# Patient Record
Sex: Male | Born: 1945 | Race: White | Hispanic: No | Marital: Married | Smoking: Never smoker
Health system: Southern US, Community
[De-identification: ages and names within clinical notes are randomized; demographics above are authoritative.]

## PROBLEM LIST (undated history)

## (undated) DIAGNOSIS — C719 Malignant neoplasm of brain, unspecified: Secondary | ICD-10-CM

## (undated) DIAGNOSIS — M1990 Unspecified osteoarthritis, unspecified site: Secondary | ICD-10-CM

## (undated) DIAGNOSIS — G500 Trigeminal neuralgia: Secondary | ICD-10-CM

## (undated) DIAGNOSIS — B019 Varicella without complication: Secondary | ICD-10-CM

## (undated) DIAGNOSIS — B069 Rubella without complication: Secondary | ICD-10-CM

## (undated) HISTORY — DX: Varicella without complication: B01.9

## (undated) HISTORY — DX: Unspecified osteoarthritis, unspecified site: M19.90

## (undated) HISTORY — PX: HERNIA REPAIR: SHX51

---

## 1997-10-02 ENCOUNTER — Emergency Department (HOSPITAL_COMMUNITY): Admission: EM | Admit: 1997-10-02 | Discharge: 1997-10-03 | Payer: Self-pay | Admitting: Emergency Medicine

## 2012-02-26 LAB — HM COLONOSCOPY

## 2015-04-06 DIAGNOSIS — Z85828 Personal history of other malignant neoplasm of skin: Secondary | ICD-10-CM | POA: Diagnosis not present

## 2015-04-06 DIAGNOSIS — C490 Malignant neoplasm of connective and soft tissue of head, face and neck: Secondary | ICD-10-CM | POA: Diagnosis not present

## 2015-06-25 DIAGNOSIS — Z6828 Body mass index (BMI) 28.0-28.9, adult: Secondary | ICD-10-CM | POA: Diagnosis not present

## 2015-06-25 DIAGNOSIS — M2012 Hallux valgus (acquired), left foot: Secondary | ICD-10-CM | POA: Diagnosis not present

## 2015-07-28 DIAGNOSIS — L570 Actinic keratosis: Secondary | ICD-10-CM | POA: Diagnosis not present

## 2015-07-28 DIAGNOSIS — L814 Other melanin hyperpigmentation: Secondary | ICD-10-CM | POA: Diagnosis not present

## 2016-01-05 DIAGNOSIS — Z23 Encounter for immunization: Secondary | ICD-10-CM | POA: Diagnosis not present

## 2016-05-05 DIAGNOSIS — L570 Actinic keratosis: Secondary | ICD-10-CM | POA: Diagnosis not present

## 2016-05-05 DIAGNOSIS — Z85828 Personal history of other malignant neoplasm of skin: Secondary | ICD-10-CM | POA: Diagnosis not present

## 2016-05-05 DIAGNOSIS — D1801 Hemangioma of skin and subcutaneous tissue: Secondary | ICD-10-CM | POA: Diagnosis not present

## 2016-05-05 DIAGNOSIS — L821 Other seborrheic keratosis: Secondary | ICD-10-CM | POA: Diagnosis not present

## 2016-11-03 DIAGNOSIS — L578 Other skin changes due to chronic exposure to nonionizing radiation: Secondary | ICD-10-CM | POA: Diagnosis not present

## 2016-11-03 DIAGNOSIS — D1801 Hemangioma of skin and subcutaneous tissue: Secondary | ICD-10-CM | POA: Diagnosis not present

## 2016-11-03 DIAGNOSIS — L570 Actinic keratosis: Secondary | ICD-10-CM | POA: Diagnosis not present

## 2016-11-03 DIAGNOSIS — L821 Other seborrheic keratosis: Secondary | ICD-10-CM | POA: Diagnosis not present

## 2016-12-19 DIAGNOSIS — Z23 Encounter for immunization: Secondary | ICD-10-CM | POA: Diagnosis not present

## 2016-12-30 ENCOUNTER — Encounter: Payer: Self-pay | Admitting: Family Medicine

## 2016-12-30 ENCOUNTER — Ambulatory Visit (INDEPENDENT_AMBULATORY_CARE_PROVIDER_SITE_OTHER): Payer: Medicare Other | Admitting: Family Medicine

## 2016-12-30 VITALS — BP 138/78 | HR 83 | Temp 98.2°F | Ht 68.0 in | Wt 179.1 lb

## 2016-12-30 DIAGNOSIS — M25512 Pain in left shoulder: Secondary | ICD-10-CM | POA: Diagnosis not present

## 2016-12-30 DIAGNOSIS — Z23 Encounter for immunization: Secondary | ICD-10-CM

## 2016-12-30 DIAGNOSIS — Z7689 Persons encountering health services in other specified circumstances: Secondary | ICD-10-CM

## 2016-12-30 MED ORDER — ZOSTER VAC RECOMB ADJUVANTED 50 MCG/0.5ML IM SUSR: 0.5000 mL | Freq: Once | INTRAMUSCULAR | 0 refills | Status: AC

## 2016-12-30 MED ORDER — ZOSTER VAC RECOMB ADJUVANTED 50 MCG/0.5ML IM SUSR: 0.5000 mL | Freq: Once | INTRAMUSCULAR | 0 refills | Status: DC

## 2016-12-30 NOTE — Progress Notes (Signed)
Subjective:    Patient ID: Hunter Chang, male    DOB: October 10, 1945, 71 y.o.   MRN: [id number]  HPI This is a 71 yo male who presents today to establish care. He is married. He is a retired Company secretary. He has three daughters and 6 grandchildren. He enjoys sports and plays tennis. He works part time at [employer] a.   Left shoulder pain following a fall while playing tennis several months ago. Caught self with left hand. Has been having intermittent tingling pain 1-2 times a day and pain radiating down shoulder. Tender at specific point. Not painful, does not interfere with sleep. No weakness. Has taken some tylenol.   Thinks last tetanus shot was 4-5 years ago. Had flu shot last week. Last CPE 3 years ago. Never abnormal cholesterol or "counts." Last colonoscopy 5 years ago- normal.   Past Medical History:  Diagnosis Date  . Arthritis   . Chicken pox    Past Surgical History:  Procedure Laterality Date  . HERNIA REPAIR     3 times   Family History  Problem Relation Age of Onset  . Arthritis Mother   . Diabetes Father   . Early death Father   . Heart disease Father   . Early death Maternal Grandfather   . COPD Paternal Grandfather    Social History  Substance Use Topics  . Smoking status: Never Smoker  . Smokeless tobacco: Never Used  . Alcohol use No      Review of Systems  Constitutional: Negative for fatigue, fever and unexpected weight change.  HENT: Negative for dental problem.   Respiratory: Negative for chest tightness and shortness of breath.   Cardiovascular: Negative for chest pain and leg swelling.  Gastrointestinal: Negative for abdominal pain, constipation and diarrhea.  Genitourinary: Negative for difficulty urinating.  Musculoskeletal:       Left shoulder pain.   Neurological: Negative for headaches.  Psychiatric/Behavioral: Negative for dysphoric mood and sleep disturbance. The patient is not nervous/anxious.        Objective:   Physical Exam Physical  Exam  Constitutional: Oriented to person, place, and time. He appears well-developed and well-nourished.  HENT:  Head: Normocephalic and atraumatic.  Eyes: Conjunctivae are normal.  Neck: Normal range of motion. Neck supple.  Cardiovascular: Normal rate, regular rhythm and normal heart sounds.   Pulmonary/Chest: Effort normal and breath sounds normal.  Musculoskeletal: Normal range of motion. Left shoulder with full ROM, strength 5/5, mild tenderness over posterior shoulder.  Neurological: Alert and oriented to person, place, and time.  Skin: Skin is warm and dry.  Psychiatric: Normal mood and affect. Behavior is normal. Judgment and thought content normal.  Vitals reviewed.     BP (!) 156/80   Pulse 83   Temp 98.2 F (36.8 C) (Oral)   Ht 5\' 8"  (1.727 m)   Wt 179 lb 1.9 oz (81.2 kg)   SpO2 95%   BMI 27.24 kg/m  Recheck BP 138/78 Depression screen PHQ 2/9 12/30/2016  Decreased Interest 0  Down, Depressed, Hopeless 0  PHQ - 2 Score 0       Assessment & Plan:  1. Encounter to establish care - will request records from prior PCP - Discussed and encouraged healthy lifestyle choices- adequate sleep, regular exercise, stress management and healthy food choices.   2. Acute pain of left shoulder - exam reassuring with full ROM and strength, will try conservative measures with NSAIDs and exercises- instructions provided - follow up with Dr.  Copland if no improvement in 1-2 weeks for consideration of injection  3. Need for prophylactic vaccination against Streptococcus pneumoniae (pneumococcus) - Pneumococcal conjugate vaccine 13-valent IM  4. Need for shingles vaccine - Zoster Vac Recomb Adjuvanted [facility]) injection; Inject 0.5 mLs into the muscle once.  Dispense: 0.5 mL; Refill: 0 - Zoster Vac Recomb Adjuvanted (SHINGRIX) injection; Inject 0.5 mLs into the muscle once. Second dose to be given 6-12 months after first.  Dispense: 0.5 mL; Refill: 0  - follow up in 1  year  Clarene Reamer, FNP-BC  [facility], [place]  12/30/2016 5:23 PM

## 2016-12-30 NOTE — Patient Instructions (Signed)
It was a pleasure to meet you today! I look forward to partnering with you for your health care needs  Please take ibuprofen 400 mg twice a day for 5-7 days for inflammation of shoulder Please try the following exercises- do not do something if it hurts If not better in 1-2 weeks, please schedule an appointment with Dr. Lorelei Pont who is our sports medicine provider  Please return in 1 year for annual visit, sooner if you have any concerns     Shoulder Exercises Ask your health care provider which exercises are safe for you. Do exercises exactly as told by your health care provider and adjust them as directed. It is normal to feel mild stretching, pulling, tightness, or discomfort as you do these exercises, but you should stop right away if you feel sudden pain or your pain gets worse.Do not begin these exercises until told by your health care provider. RANGE OF MOTION EXERCISES These exercises warm up your muscles and joints and improve the movement and flexibility of your shoulder. These exercises also help to relieve pain, numbness, and tingling. These exercises involve stretching your injured shoulder directly. Exercise A: Pendulum  1. Stand near a wall or a surface that you can hold onto for balance. 2. Bend at the waist and let your left / right arm hang straight down. Use your other arm to support you. Keep your back straight and do not lock your knees. 3. Relax your left / right arm and shoulder muscles, and move your hips and your trunk so your left / right arm swings freely. Your arm should swing because of the motion of your body, not because you are using your arm or shoulder muscles. 4. Keep moving your body so your arm swings in the following directions, as told by your health care provider: ? Side to side. ? Forward and backward. ? In clockwise and counterclockwise circles. 5. Continue each motion for ______10-15____ seconds, or for as long as told by your health care  provider. 6. Slowly return to the starting position. Repeat _____2-3_____ times. Complete this exercise ___1-2_______ times a day. Exercise B:Flexion, Standing  1. Stand and hold a broomstick, a cane, or a similar object. Place your hands a little more than shoulder-width apart on the object. Your left / right hand should be palm-up, and your other hand should be palm-down. 2. Keep your elbow straight and keep your shoulder muscles relaxed. Push the stick down with your healthy arm to raise your left / right arm in front of your body, and then over your head until you feel a stretch in your shoulder. ? Avoid shrugging your shoulder while you raise your arm. Keep your shoulder blade tucked down toward the middle of your back. 3. Hold for ___5-10_______ seconds. 4. Slowly return to the starting position. Repeat _______3-4___ times. Complete this exercise ______1-2____ times a day. Exercise C: Abduction, Standing 1. Stand and hold a broomstick, a cane, or a similar object. Place your hands a little more than shoulder-width apart on the object. Your left / right hand should be palm-up, and your other hand should be palm-down. 2. While keeping your elbow straight and your shoulder muscles relaxed, push the stick across your body toward your left / right side. Raise your left / right arm to the side of your body and then over your head until you feel a stretch in your shoulder. ? Do not raise your arm above shoulder height, unless your health care provider tells  you to do that. ? Avoid shrugging your shoulder while you raise your arm. Keep your shoulder blade tucked down toward the middle of your back. 3. Hold for _______5-10___ seconds. 4. Slowly return to the starting position. Repeat _______3-4___ times. Complete this exercise ___1-2_______ times a day. Exercise D:Internal Rotation  1. Place your left / right hand behind your back, palm-up. 2. Use your other hand to dangle an exercise band, a  towel, or a similar object over your shoulder. Grasp the band with your left / right hand so you are holding onto both ends. 3. Gently pull up on the band until you feel a stretch in the front of your left / right shoulder. ? Avoid shrugging your shoulder while you raise your arm. Keep your shoulder blade tucked down toward the middle of your back. 4. Hold for _____5-10_____ seconds. 5. Release the stretch by letting go of the band and lowering your hands. Repeat ________3-4__ times. Complete this exercise ________1-2__ times a day. STRETCHING EXERCISES These exercises warm up your muscles and joints and improve the movement and flexibility of your shoulder. These exercises also help to relieve pain, numbness, and tingling. These exercises are done using your healthy shoulder to help stretch the muscles of your injured shoulder. Exercise E: Warehouse manager (External Rotation and Abduction)  1. Stand in a doorway with one of your feet slightly in front of the other. This is called a staggered stance. If you cannot reach your forearms to the door frame, stand facing a corner of a room. 2. Choose one of the following positions as told by your health care provider: ? Place your hands and forearms on the door frame above your head. ? Place your hands and forearms on the door frame at the height of your head. ? Place your hands on the door frame at the height of your elbows. 3. Slowly move your weight onto your front foot until you feel a stretch across your chest and in the front of your shoulders. Keep your head and chest upright and keep your abdominal muscles tight. 4. Hold for ___5-10_______ seconds. 5. To release the stretch, shift your weight to your back foot. Repeat _____3-4_____ times. Complete this stretch ____1-2______ times a day. Exercise F:Extension, Standing 1. Stand and hold a broomstick, a cane, or a similar object behind your back. ? Your hands should be a little wider than  shoulder-width apart. ? Your palms should face away from your back. 2. Keeping your elbows straight and keeping your shoulder muscles relaxed, move the stick away from your body until you feel a stretch in your shoulder. ? Avoid shrugging your shoulders while you move the stick. Keep your shoulder blade tucked down toward the middle of your back. 3. Hold for __________ seconds. 4. Slowly return to the starting position. Repeat __________ times. Complete this exercise __________ times a day. STRENGTHENING EXERCISES These exercises build strength and endurance in your shoulder. Endurance is the ability to use your muscles for a long time, even after they get tired. Exercise G:External Rotation  1. Sit in a stable chair without armrests. 2. Secure an exercise band at elbow height on your left / right side. 3. Place a soft object, such as a folded towel or a small pillow, between your left / right upper arm and your body to move your elbow a few inches away (about 10 cm) from your side. 4. Hold the end of the band so it is tight and there is  no slack. 5. Keeping your elbow pressed against the soft object, move your left / right forearm out, away from your abdomen. Keep your body steady so only your forearm moves. 6. Hold for __________ seconds. 7. Slowly return to the starting position. Repeat __________ times. Complete this exercise __________ times a day. Exercise H:Shoulder Abduction  1. Sit in a stable chair without armrests, or stand. 2. Hold a __________ weight in your left / right hand, or hold an exercise band with both hands. 3. Start with your arms straight down and your left / right palm facing in, toward your body. 4. Slowly lift your left / right hand out to your side. Do not lift your hand above shoulder height unless your health care provider tells you that this is safe. ? Keep your arms straight. ? Avoid shrugging your shoulder while you do this movement. Keep your shoulder  blade tucked down toward the middle of your back. 5. Hold for __________ seconds. 6. Slowly lower your arm, and return to the starting position. Repeat __________ times. Complete this exercise __________ times a day. Exercise I:Shoulder Extension 1. Sit in a stable chair without armrests, or stand. 2. Secure an exercise band to a stable object in front of you where it is at shoulder height. 3. Hold one end of the exercise band in each hand. Your palms should face each other. 4. Straighten your elbows and lift your hands up to shoulder height. 5. Step back, away from the secured end of the exercise band, until the band is tight and there is no slack. 6. Squeeze your shoulder blades together as you pull your hands down to the sides of your thighs. Stop when your hands are straight down by your sides. Do not let your hands go behind your body. 7. Hold for __________ seconds. 8. Slowly return to the starting position. Repeat __________ times. Complete this exercise __________ times a day. Exercise J:Standing Shoulder Row 1. Sit in a stable chair without armrests, or stand. 2. Secure an exercise band to a stable object in front of you so it is at waist height. 3. Hold one end of the exercise band in each hand. Your palms should be in a thumbs-up position. 4. Bend each of your elbows to an "L" shape (about 90 degrees) and keep your upper arms at your sides. 5. Step back until the band is tight and there is no slack. 6. Slowly pull your elbows back behind you. 7. Hold for __________ seconds. 8. Slowly return to the starting position. Repeat __________ times. Complete this exercise __________ times a day. Exercise K:Shoulder Press-Ups  1. Sit in a stable chair that has armrests. Sit upright, with your feet flat on the floor. 2. Put your hands on the armrests so your elbows are bent and your fingers are pointing forward. Your hands should be about even with the sides of your body. 3. Push down  on the armrests and use your arms to lift yourself off of the chair. Straighten your elbows and lift yourself up as much as you comfortably can. ? Move your shoulder blades down, and avoid letting your shoulders move up toward your ears. ? Keep your feet on the ground. As you get stronger, your feet should support less of your body weight as you lift yourself up. 4. Hold for __________ seconds. 5. Slowly lower yourself back into the chair. Repeat __________ times. Complete this exercise __________ times a day. Exercise L: Wall Push-Ups  1.  Stand so you are facing a stable wall. Your feet should be about one arm-length away from the wall. 2. Lean forward and place your palms on the wall at shoulder height. 3. Keep your feet flat on the floor as you bend your elbows and lean forward toward the wall. 4. Hold for __________ seconds. 5. Straighten your elbows to push yourself back to the starting position. Repeat __________ times. Complete this exercise __________ times a day. This information is not intended to replace advice given to you by your health care provider. Make sure you discuss any questions you have with your health care provider. Document Released: 01/26/2005 Document Revised: 12/07/2015 Document Reviewed: 11/23/2014 Elsevier Interactive Patient Education  2018 [employer].

## 2016-12-30 NOTE — Progress Notes (Signed)
Pre visit review using our clinic review tool, if applicable. No additional management support is needed unless otherwise documented below in the visit note. 

## 2017-01-02 ENCOUNTER — Telehealth: Payer: Self-pay | Admitting: Family Medicine

## 2017-01-02 NOTE — Telephone Encounter (Signed)
Left pt message asking to call Allison back directly at [phone] to schedule AWV + labs with Lesia and CPE with PCP. ° °*NOTE* No hx of AWV °

## 2017-02-21 NOTE — Telephone Encounter (Signed)
Scheduled 03/03/17

## 2017-02-28 ENCOUNTER — Encounter: Payer: Self-pay | Admitting: Family Medicine

## 2017-03-02 ENCOUNTER — Telehealth: Payer: Self-pay | Admitting: Family Medicine

## 2017-03-02 NOTE — Progress Notes (Signed)
Subjective:   Hunter Chang is a 71 y.o. male who presents for an Initial Medicare Annual Wellness Visit.  Review of Systems  No ROS.  Medicare Wellness Visit. Additional risk factors are reflected in the social history.  Cardiac Risk Factors include: advanced age (>66men, >53 women);family history of premature cardiovascular disease;male gender Sleep patterns: Sleeps 6 hours, feels rested.  Home Safety/Smoke Alarms: Feels safe in home. Smoke alarms in place.  Living environment; residence and Firearm Safety: Lives with wife in 1 story home.  Seat Belt Safety/Bike Helmet: Wears seat belt.    Male:   CCS-Colonoscopy 03/29/2011, pt reports normal.      PSA- No results found for: PSA     Objective:    Today's Vitals   03/03/17 1341  BP: 130/76  Pulse: (!) 58  SpO2: 99%  Weight: 179 lb 1.9 oz (81.2 kg)  Height: 5\' 8"  (1.727 m)   Body mass index is 27.24 kg/m.  Advanced Directives 03/03/2017  Does Patient Have a Medical Advance Directive? No  Would patient like information on creating a medical advance directive? No - Patient declined    Current Medications (verified) Outpatient Encounter Medications as of 03/03/2017  Medication Sig  . aspirin 81 MG EC tablet Take 81 mg by mouth daily. Swallow whole.   No facility-administered encounter medications on file as of 03/03/2017.     Allergies (verified) Patient has no allergy information on record.   History: Past Medical History:  Diagnosis Date  . Arthritis   . Chicken pox    Past Surgical History:  Procedure Laterality Date  . HERNIA REPAIR     3 times   Family History  Problem Relation Age of Onset  . Arthritis Mother   . Diabetes Father   . Early death Father   . Heart disease Father   . Early death Maternal Grandfather   . COPD Paternal Grandfather    Social History   Socioeconomic History  . Marital status: Married    Spouse name: None  . Number of children: None  . Years of education: None  .  Highest education level: None  Social Needs  . Financial resource strain: None  . Food insecurity - worry: None  . Food insecurity - inability: None  . Transportation needs - medical: None  . Transportation needs - non-medical: None  Occupational History  . None  Tobacco Use  . Smoking status: Never Smoker  . Smokeless tobacco: Never Used  Substance and Sexual Activity  . Alcohol use: No  . Drug use: No  . Sexual activity: Yes  Other Topics Concern  . None  Social History Narrative  . None   Tobacco Counseling Counseling given: Not Answered    Activities of Daily Living In your present state of health, do you have any difficulty performing the following activities: 03/03/2017  Hearing? N  Vision? N  Difficulty concentrating or making decisions? N  Walking or climbing stairs? N  Dressing or bathing? N  Doing errands, shopping? N  Preparing Food and eating ? N  Using the Toilet? N  In the past six months, have you accidently leaked urine? N  Do you have problems with loss of bowel control? N  Managing your Medications? N  Managing your Finances? N  Housekeeping or managing your Housekeeping? N  Some recent data might be hidden      Immunizations and Health Maintenance Immunization History  Administered Date(s) Administered  . Pneumococcal Conjugate-13 12/30/2016  .  Tdap 06/05/2012   Health Maintenance Due  Topic Date Due  . INFLUENZA VACCINE  10/26/2016    Patient Care Team: Elby Beck, FNP as PCP - General (Nurse Practitioner) Dermatology, [facility] any recent Medical Services you may have received from other than [facility] providers in the past year (date may be approximate).    Assessment:   This is a routine wellness examination for Hunter Chang. Physical assessment deferred to PCP.   Hearing/Vision screen  Visual Acuity Screening   Right eye Left eye Both eyes  Without correction: 20/30 20/30 20/30   With correction:     Comments: Last  exam > 3 years. Wears reading glasses.   Hearing Screening Comments: Able to hear conversational tones w/o difficulty. No issues reported.    Dietary issues and exercise activities discussed: Current Exercise Habits: Structured exercise class, Type of exercise: walking(golf, tennis), Time (Minutes): 30, Frequency (Times/Week): 4, Weekly Exercise (Minutes/Week): 120, Exercise limited by: None identified Diet (meal preparation, eat out, water intake, caffeinated beverages, dairy products, fruits and vegetables): Drinks water, coffee and sweet tea.   Eats heart healthy diet.     Goals    . Patient Stated     Maintain current health by staying active.       Depression Screen PHQ 2/9 Scores 03/03/2017 12/30/2016  PHQ - 2 Score 0 0    Fall Risk Fall Risk  03/03/2017 12/30/2016  Falls in the past year? No Yes  Number falls in past yr: - 1  Injury with Fall? - Yes     Cognitive Function: MMSE - Mini Mental State Exam 03/03/2017  Orientation to time 5  Orientation to Place 5  Registration 3  Attention/ Calculation 5  Recall 3  Language- name 2 objects 2  Language- repeat 1  Language- follow 3 step command 3  Language- read & follow direction 1  Write a sentence 1  Copy design 1  Total score 30        Screening Tests Health Maintenance  Topic Date Due  . INFLUENZA VACCINE  10/26/2016  . Hepatitis C Screening  03/03/2018 (Originally October 14, 1945)  . PNA vac Low Risk Adult (2 of 2 - PPSV23) 12/30/2017  . COLONOSCOPY  02/25/2022  . TETANUS/TDAP  06/06/2022        Plan:     Make eye appointment.   Bring a copy of your living will and/or healthcare power of attorney to your next office visit.  Continue doing brain stimulating activities (puzzles, reading, adult coloring books, staying active) to keep memory sharp.   I have personally reviewed and noted the following in the patient's chart:   . Medical and social history . Use of alcohol, tobacco or illicit drugs   . Current medications and supplements . Functional ability and status . Nutritional status . Physical activity . Advanced directives . List of other physicians . Hospitalizations, surgeries, and ER visits in previous 12 months . Vitals . Screenings to include cognitive, depression, and falls . Referrals and appointments  In addition, I have reviewed and discussed with patient certain preventive protocols, quality metrics, and best practice recommendations. A written personalized care plan for preventive services as well as general preventive health recommendations were provided to patient.     Gerilyn Nestle, RN   03/03/2017

## 2017-03-02 NOTE — Telephone Encounter (Signed)
Please enter AWV lab orders due to Lesia out of office °

## 2017-03-03 ENCOUNTER — Ambulatory Visit (INDEPENDENT_AMBULATORY_CARE_PROVIDER_SITE_OTHER): Payer: Medicare Other

## 2017-03-03 ENCOUNTER — Other Ambulatory Visit: Payer: Self-pay

## 2017-03-03 ENCOUNTER — Other Ambulatory Visit: Payer: Self-pay | Admitting: Family Medicine

## 2017-03-03 ENCOUNTER — Other Ambulatory Visit: Payer: Medicare Other

## 2017-03-03 VITALS — BP 130/76 | HR 58 | Ht 68.0 in | Wt 179.1 lb

## 2017-03-03 DIAGNOSIS — E663 Overweight: Secondary | ICD-10-CM

## 2017-03-03 DIAGNOSIS — Z13220 Encounter for screening for lipoid disorders: Secondary | ICD-10-CM | POA: Diagnosis not present

## 2017-03-03 DIAGNOSIS — Z0000 Encounter for general adult medical examination without abnormal findings: Secondary | ICD-10-CM

## 2017-03-03 LAB — COMPREHENSIVE METABOLIC PANEL
ALBUMIN: 4.2 g/dL (ref 3.5–5.2)
ALK PHOS: 86 U/L (ref 39–117)
ALT: 20 U/L (ref 0–53)
AST: 31 U/L (ref 0–37)
BUN: 14 mg/dL (ref 6–23)
CALCIUM: 9.2 mg/dL (ref 8.4–10.5)
CO2: 29 mEq/L (ref 19–32)
CREATININE: 1 mg/dL (ref 0.40–1.50)
Chloride: 103 mEq/L (ref 96–112)
GFR: 78.36 mL/min (ref >60.00)
Glucose, Bld: 90 mg/dL (ref 70–99)
POTASSIUM: 4.4 meq/L (ref 3.5–5.1)
SODIUM: 139 meq/L (ref 135–145)
TOTAL PROTEIN: 6.9 g/dL (ref 6.0–8.3)
Total Bilirubin: 0.9 mg/dL (ref 0.2–1.2)

## 2017-03-03 LAB — LIPID PANEL
Cholesterol: 165 mg/dL (ref 0–200)
HDL: 48.2 mg/dL (ref >39.00)
LDL CALC: 97 mg/dL (ref 0–99)
NONHDL: 116.31
Total CHOL/HDL Ratio: 3
Triglycerides: 95 mg/dL (ref 0.0–149.0)
VLDL: 19 mg/dL (ref 0.0–40.0)

## 2017-03-03 LAB — CBC
HCT: 44.9 % (ref 39.0–52.0)
HEMOGLOBIN: 15.4 g/dL (ref 13.0–17.0)
MCHC: 34.2 g/dL (ref 30.0–36.0)
MCV: 91.4 fl (ref 78.0–100.0)
Platelets: 212 10*3/uL (ref 150.0–400.0)
RBC: 4.92 Mil/uL (ref 4.22–5.81)
RDW: 13.6 % (ref 11.5–15.5)
WBC: 5.6 10*3/uL (ref 4.0–10.5)

## 2017-03-03 NOTE — Addendum Note (Signed)
Addended by: Mady Haagensen on: 03/03/2017 02:07 PM   Modules accepted: Orders

## 2017-03-03 NOTE — Patient Instructions (Addendum)
Hunter Chang , Thank you for taking time to come for your Medicare Wellness Visit. I appreciate your ongoing commitment to your health goals. Please review the following plan we discussed and let me know if I can assist you in the future.   These are the goals we discussed: Goals    . Patient Stated     Maintain current health by staying active.        This is a list of the screening recommended for you and due dates:  Health Maintenance  Topic Date Due  . Flu Shot  10/26/2016  .  Hepatitis C: One time screening is recommended by Center for Disease Control  (CDC) for  adults born from [birth year] through 1965.   03/03/2018*  . Pneumonia vaccines (2 of 2 - PPSV23) 12/30/2017  . Colon Cancer Screening  02/25/2022  . Tetanus Vaccine  06/06/2022  *Topic was postponed. The date shown is not the original due date.    Make eye appointment.   Bring a copy of your living will and/or healthcare power of attorney to your next office visit.  Continue doing brain stimulating activities (puzzles, reading, adult coloring books, staying active) to keep memory sharp.    Fall Prevention in the Home Falls can cause injuries. They can happen to people of all ages. There are many things you can do to make your home safe and to help prevent falls. What can I do on the outside of my home?  Regularly fix the edges of walkways and driveways and fix any cracks.  Remove anything that might make you trip as you walk through a door, such as a raised step or threshold.  Trim any bushes or trees on the path to your home.  Use bright outdoor lighting.  Clear any walking paths of anything that might make someone trip, such as rocks or tools.  Regularly check to see if handrails are loose or broken. Make sure that both sides of any steps have handrails.  Any raised decks and porches should have guardrails on the edges.  Have any leaves, snow, or ice cleared regularly.  Use sand or salt on walking paths during  winter.  Clean up any spills in your garage right away. This includes oil or grease spills. What can I do in the bathroom?  Use night lights.  Install grab bars by the toilet and in the tub and shower. Do not use towel bars as grab bars.  Use non-skid mats or decals in the tub or shower.  If you need to sit down in the shower, use a plastic, non-slip stool.  Keep the floor dry. Clean up any water that spills on the floor as soon as it happens.  Remove soap buildup in the tub or shower regularly.  Attach bath mats securely with double-sided non-slip rug tape.  Do not have throw rugs and other things on the floor that can make you trip. What can I do in the bedroom?  Use night lights.  Make sure that you have a light by your bed that is easy to reach.  Do not use any sheets or blankets that are too big for your bed. They should not hang down onto the floor.  Have a firm chair that has side arms. You can use this for support while you get dressed.  Do not have throw rugs and other things on the floor that can make you trip. What can I do in the kitchen?  Clean  up any spills right away.  Avoid walking on wet floors.  Keep items that you use a lot in easy-to-reach places.  If you need to reach something above you, use a strong step stool that has a grab bar.  Keep electrical cords out of the way.  Do not use floor polish or wax that makes floors slippery. If you must use wax, use non-skid floor wax.  Do not have throw rugs and other things on the floor that can make you trip. What can I do with my stairs?  Do not leave any items on the stairs.  Make sure that there are handrails on both sides of the stairs and use them. Fix handrails that are broken or loose. Make sure that handrails are as long as the stairways.  Check any carpeting to make sure that it is firmly attached to the stairs. Fix any carpet that is loose or worn.  Avoid having throw rugs at the top or  bottom of the stairs. If you do have throw rugs, attach them to the floor with carpet tape.  Make sure that you have a light switch at the top of the stairs and the bottom of the stairs. If you do not have them, ask someone to add them for you. What else can I do to help prevent falls?  Wear shoes that: ? Do not have high heels. ? Have rubber bottoms. ? Are comfortable and fit you well. ? Are closed at the toe. Do not wear sandals.  If you use a stepladder: ? Make sure that it is fully opened. Do not climb a closed stepladder. ? Make sure that both sides of the stepladder are locked into place. ? Ask someone to hold it for you, if possible.  Clearly mark and make sure that you can see: ? Any grab bars or handrails. ? First and last steps. ? Where the edge of each step is.  Use tools that help you move around (mobility aids) if they are needed. These include: ? Canes. ? Walkers. ? Scooters. ? Crutches.  Turn on the lights when you go into a dark area. Replace any light bulbs as soon as they burn out.  Set up your furniture so you have a clear path. Avoid moving your furniture around.  If any of your floors are uneven, fix them.  If there are any pets around you, be aware of where they are.  Review your medicines with your doctor. Some medicines can make you feel dizzy. This can increase your chance of falling. Ask your doctor what other things that you can do to help prevent falls. This information is not intended to replace advice given to you by your health care provider. Make sure you discuss any questions you have with your health care provider. Document Released: 01/08/2009 Document Revised: 08/20/2015 Document Reviewed: 04/18/2014 Elsevier Interactive Patient Education  2018 [place] Maintenance, Male A healthy lifestyle and preventive care is important for your health and wellness. Ask your health care provider about what schedule of regular  examinations is right for you. What should I know about weight and diet? Eat a Healthy Diet  Eat plenty of vegetables, fruits, whole grains, low-fat dairy products, and lean protein.  Do not eat a lot of foods high in solid fats, added sugars, or salt.  Maintain a Healthy Weight Regular exercise can help you achieve or maintain a healthy weight. You should:  Do at least 150 minutes  of exercise each week. The exercise should increase your heart rate and make you sweat (moderate-intensity exercise).  Do strength-training exercises at least twice a week.  Watch Your Levels of Cholesterol and Blood Lipids  Have your blood tested for lipids and cholesterol every 5 years starting at 70 years of age. If you are at high risk for heart disease, you should start having your blood tested when you are 70 years old. You may need to have your cholesterol levels checked more often if: ? Your lipid or cholesterol levels are high. ? You are older than 70 years of age. ? You are at high risk for heart disease.  What should I know about cancer screening? Many types of cancers can be detected early and may often be prevented. Lung Cancer  You should be screened every year for lung cancer if: ? You are a current smoker who has smoked for at least 30 years. ? You are a former smoker who has quit within the past 15 years.  Talk to your health care provider about your screening options, when you should start screening, and how often you should be screened.  Colorectal Cancer  Routine colorectal cancer screening usually begins at 70 years of age and should be repeated every 5-10 years until you are 70 years old. You may need to be screened more often if early forms of precancerous polyps or small growths are found. Your health care provider may recommend screening at an earlier age if you have risk factors for colon cancer.  Your health care provider may recommend using home test kits to check for hidden  blood in the stool.  A small camera at the end of a tube can be used to examine your colon (sigmoidoscopy or colonoscopy). This checks for the earliest forms of colorectal cancer.  Prostate and Testicular Cancer  Depending on your age and overall health, your health care provider may do certain tests to screen for prostate and testicular cancer.  Talk to your health care provider about any symptoms or concerns you have about testicular or prostate cancer.  Skin Cancer  Check your skin from head to toe regularly.  Tell your health care provider about any new moles or changes in moles, especially if: ? There is a change in a mole's size, shape, or color. ? You have a mole that is larger than a pencil eraser.  Always use sunscreen. Apply sunscreen liberally and repeat throughout the day.  Protect yourself by wearing long sleeves, pants, a wide-brimmed hat, and sunglasses when outside.  What should I know about heart disease, diabetes, and high blood pressure?  If you are 76-77 years of age, have your blood pressure checked every 3-5 years. If you are 34 years of age or older, have your blood pressure checked every year. You should have your blood pressure measured twice-once when you are at a hospital or clinic, and once when you are not at a hospital or clinic. Record the average of the two measurements. To check your blood pressure when you are not at a hospital or clinic, you can use: ? An automated blood pressure machine at a pharmacy. ? A home blood pressure monitor.  Talk to your health care provider about your target blood pressure.  If you are between 61-95 years old, ask your health care provider if you should take aspirin to prevent heart disease.  Have regular diabetes screenings by checking your fasting blood sugar level. ? If you are  at a normal weight and have a low risk for diabetes, have this test once every three years after the age of 61. ? If you are overweight and  have a high risk for diabetes, consider being tested at a younger age or more often.  A one-time screening for abdominal aortic aneurysm (AAA) by ultrasound is recommended for men aged 81-75 years who are current or former smokers. What should I know about preventing infection? Hepatitis B If you have a higher risk for hepatitis B, you should be screened for this virus. Talk with your health care provider to find out if you are at risk for hepatitis B infection. Hepatitis C Blood testing is recommended for:  Everyone born from [birth year] through 1965.  Anyone with known risk factors for hepatitis C.  Sexually Transmitted Diseases (STDs)  You should be screened each year for STDs including gonorrhea and chlamydia if: ? You are sexually active and are younger than 70 years of age. ? You are older than 70 years of age and your health care provider tells you that you are at risk for this type of infection. ? Your sexual activity has changed since you were last screened and you are at an increased risk for chlamydia or gonorrhea. Ask your health care provider if you are at risk.  Talk with your health care provider about whether you are at high risk of being infected with HIV. Your health care provider may recommend a prescription medicine to help prevent HIV infection.  What else can I do?  Schedule regular health, dental, and eye exams.  Stay current with your vaccines (immunizations).  Do not use any tobacco products, such as cigarettes, chewing tobacco, and e-cigarettes. If you need help quitting, ask your health care provider.  Limit alcohol intake to no more than 2 drinks per day. One drink equals 12 ounces of beer, 5 ounces of wine, or 1 ounces of hard liquor.  Do not use street drugs.  Do not share needles.  Ask your health care provider for help if you need support or information about quitting drugs.  Tell your health care provider if you often feel depressed.  Tell your  health care provider if you have ever been abused or do not feel safe at home. This information is not intended to replace advice given to you by your health care provider. Make sure you discuss any questions you have with your health care provider. Document Released: 09/10/2007 Document Revised: 11/11/2015 Document Reviewed: 12/16/2014 Elsevier Interactive Patient Education  [employer].

## 2017-03-03 NOTE — Telephone Encounter (Signed)
They have been entered.

## 2017-03-03 NOTE — Progress Notes (Signed)
PCP notes:   Health maintenance:  UTD  Abnormal screenings:   Vision 20/30 right , left and both. Plans to make appt.  Patient concerns:   None  Nurse concerns:  None  Next PCP appt:   03/10/17

## 2017-03-08 NOTE — Progress Notes (Signed)
I reviewed health advisor's note, was available for consultation, and agree with documentation and plan.  

## 2017-03-10 ENCOUNTER — Ambulatory Visit: Payer: Medicare Other | Admitting: Family Medicine

## 2017-04-07 ENCOUNTER — Ambulatory Visit (INDEPENDENT_AMBULATORY_CARE_PROVIDER_SITE_OTHER): Payer: Medicare Other | Admitting: Family Medicine

## 2017-04-07 ENCOUNTER — Encounter: Payer: Self-pay | Admitting: Family Medicine

## 2017-04-07 VITALS — BP 138/82 | HR 68 | Temp 98.0°F | Wt 182.0 lb

## 2017-04-07 DIAGNOSIS — Z0000 Encounter for general adult medical examination without abnormal findings: Secondary | ICD-10-CM | POA: Diagnosis not present

## 2017-04-07 DIAGNOSIS — M25512 Pain in left shoulder: Secondary | ICD-10-CM

## 2017-04-07 DIAGNOSIS — E663 Overweight: Secondary | ICD-10-CM

## 2017-04-07 NOTE — Patient Instructions (Signed)
GReat to see you today!  I will see you in 1 year unless you need me sooner.  Keeping you healthy  Get these tests  Blood pressure- Have your blood pressure checked once a year by your healthcare provider.  Normal blood pressure is 120/80  Weight- Have your body mass index (BMI) calculated to screen for obesity.  BMI is a measure of body fat based on height and weight. You can also calculate your own BMI at ViewBanking.si.  Cholesterol- Have your cholesterol checked every year.  Diabetes- Have your blood sugar checked regularly if you have high blood pressure, high cholesterol, have a family history of diabetes or if you are overweight.  Screening for Colon Cancer- Colonoscopy starting at age 14.  Screening may begin sooner depending on your family history and other health conditions. Follow up colonoscopy as directed by your Gastroenterologist.  Screening for Prostate Cancer- Both blood work (PSA) and a rectal exam help screen for Prostate Cancer.  Screening begins at age 53 with African-American men and at age 62 with Caucasian men.  Screening may begin sooner depending on your family history.  Take these medicines  Aspirin- One aspirin daily can help prevent Heart disease and Stroke.  Flu shot- Every fall.  Tetanus- Every 10 years.  Zostavax- Once after the age of 71 to prevent Shingles.  Pneumonia shot- Once after the age of 71; if you are younger than 71, ask your healthcare provider if you need a Pneumonia shot.  Take these steps  Don't smoke- If you do smoke, talk to your doctor about quitting.  For tips on how to quit, go to www.smokefree.gov or call 1-800-QUIT-NOW.  Be physically active- Exercise 5 days a week for at least 30 minutes.  If you are not already physically active start slow and gradually work up to 30 minutes of moderate physical activity.  Examples of moderate activity include walking briskly, mowing the yard, dancing, swimming, bicycling,  etc.  Eat a healthy diet- Eat a variety of healthy food such as fruits, vegetables, low fat milk, low fat cheese, yogurt, lean meant, poultry, fish, beans, tofu, etc. For more information go to www.thenutritionsource.org  Drink alcohol in moderation- Limit alcohol intake to less than two drinks a day. Never drink and drive.  Dentist- Brush and floss twice daily; visit your dentist twice a year.  Depression- Your emotional health is as important as your physical health. If you're feeling down, or losing interest in things you would normally enjoy please talk to your healthcare provider.  Eye exam- Visit your eye doctor every year.  Safe sex- If you may be exposed to a sexually transmitted infection, use a condom.  Seat belts- Seat belts can save your life; always wear one.  Smoke/Carbon Monoxide detectors- These detectors need to be installed on the appropriate level of your home.  Replace batteries at least once a year.  Skin cancer- When out in the sun, cover up and use sunscreen 15 SPF or higher.  Violence- If anyone is threatening you, please tell your healthcare provider.  Living Will/ Health care power of attorney- Speak with your healthcare provider and family.

## 2017-04-07 NOTE — Progress Notes (Signed)
Subjective:    Patient ID: Hunter Chang, male    DOB: 21-Aug-1945, 72 y.o.   MRN: [id number]  HPI This is a 72 yo male who presents today for CPE. Had Medicare AWV last month with labs. He has been doing well with recent travel to [place] with his children and grandchildren. 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 He has no complaints or concerns today.   Last CPE- 1 year Colonoscopy- 2013 Tdap- 06/05/12 Flu- annual Dental- semi-annual Eye-annual Exercise- regular  Past Medical History:  Diagnosis Date  . Arthritis   . Chicken pox    Past Surgical History:  Procedure Laterality Date  . HERNIA REPAIR     3 times   Family History  Problem Relation Age of Onset  . Arthritis Mother   . Diabetes Father   . Early death Father   . Heart disease Father   . Early death Maternal Grandfather   . COPD Paternal Grandfather    Social History   Tobacco Use  . Smoking status: Never Smoker  . Smokeless tobacco: Never Used  Substance Use Topics  . Alcohol use: No  . Drug use: No     Review of Systems  Constitutional: Negative for fever.  HENT: Negative.   Eyes: Negative.   Respiratory: Negative for cough and shortness of breath.   Cardiovascular: Negative for chest pain and leg swelling.  Gastrointestinal: Negative.   Genitourinary: Negative.   Musculoskeletal:       Shoulder pain improved, no other MS complaints.   Skin: Negative.        Sees derm, wears sunscreen daily.   Neurological: Negative.   Hematological: Negative.   Psychiatric/Behavioral: Negative.        Objective:   Physical Exam  Constitutional: He is oriented to person, place, and time. He appears well-developed and well-nourished. No distress.  HENT:  Head: Normocephalic and atraumatic.  Right Ear: External ear normal.  Left Ear: External ear normal.  Mouth/Throat: Oropharynx is clear and moist. No oropharyngeal exudate.  Eyes: Conjunctivae and EOM are normal. Pupils are equal, round, and reactive  to light. Right eye exhibits no discharge. Left eye exhibits no discharge.  Neck: Normal range of motion. Neck supple. No thyromegaly present.  Cardiovascular: Normal rate, regular rhythm, normal heart sounds and intact distal pulses.  Pulmonary/Chest: Effort normal and breath sounds normal.  Abdominal: Soft. Bowel sounds are normal. He exhibits no distension. There is no tenderness. There is no rebound.  Musculoskeletal: Normal range of motion. He exhibits no edema.  Lymphadenopathy:    He has no cervical adenopathy.  Neurological: He is alert and oriented to person, place, and time.  Skin: Skin is warm and dry. He is not diaphoretic.  Psychiatric: He has a normal mood and affect. His behavior is normal. Judgment and thought content normal.  Vitals reviewed.     BP 138/82   Pulse 68   Temp 98 F (36.7 C) (Oral)   Wt 182 lb (82.6 kg)   SpO2 95%   BMI 27.67 kg/m  Wt Readings from Last 3 Encounters:  04/07/17 182 lb (82.6 kg)  03/03/17 179 lb 1.9 oz (81.2 kg)  12/30/16 179 lb 1.9 oz (81.2 kg)   Depression screen [facility] 2/9 03/03/2017 12/30/2016  Decreased Interest 0 0  Down, Depressed, Hopeless 0 0  PHQ - 2 Score 0 0       Assessment & Plan:  1. Annual physical exam - Discussed and encouraged healthy lifestyle  choices- adequate sleep, regular exercise, stress management and healthy food choices.  - follow up in 1 year  2. Overweight (BMI 25.0-29.9) - Reviewed lipid panel results, encouraged continued regular exercise, healthy food choices  3. Acute pain of left shoulder - Significantly improved   Clarene Reamer, FNP-BC  [facility], [place]  04/07/2017 8:23 AM

## 2017-05-09 DIAGNOSIS — L821 Other seborrheic keratosis: Secondary | ICD-10-CM | POA: Diagnosis not present

## 2017-05-09 DIAGNOSIS — L570 Actinic keratosis: Secondary | ICD-10-CM | POA: Diagnosis not present

## 2017-05-09 DIAGNOSIS — D1801 Hemangioma of skin and subcutaneous tissue: Secondary | ICD-10-CM | POA: Diagnosis not present

## 2017-05-09 DIAGNOSIS — L812 Freckles: Secondary | ICD-10-CM | POA: Diagnosis not present

## 2017-10-09 DIAGNOSIS — C490 Malignant neoplasm of connective and soft tissue of head, face and neck: Secondary | ICD-10-CM | POA: Diagnosis not present

## 2017-10-09 DIAGNOSIS — D485 Neoplasm of uncertain behavior of skin: Secondary | ICD-10-CM | POA: Diagnosis not present

## 2017-10-31 DIAGNOSIS — D481 Neoplasm of uncertain behavior of connective and other soft tissue: Secondary | ICD-10-CM | POA: Diagnosis not present

## 2017-10-31 DIAGNOSIS — C4442 Squamous cell carcinoma of skin of scalp and neck: Secondary | ICD-10-CM | POA: Diagnosis not present

## 2017-12-03 DIAGNOSIS — Z23 Encounter for immunization: Secondary | ICD-10-CM | POA: Diagnosis not present

## 2017-12-30 DIAGNOSIS — Z23 Encounter for immunization: Secondary | ICD-10-CM | POA: Diagnosis not present

## 2018-01-02 DIAGNOSIS — D485 Neoplasm of uncertain behavior of skin: Secondary | ICD-10-CM | POA: Diagnosis not present

## 2018-01-02 DIAGNOSIS — L812 Freckles: Secondary | ICD-10-CM | POA: Diagnosis not present

## 2018-01-02 DIAGNOSIS — L821 Other seborrheic keratosis: Secondary | ICD-10-CM | POA: Diagnosis not present

## 2018-01-02 DIAGNOSIS — D1801 Hemangioma of skin and subcutaneous tissue: Secondary | ICD-10-CM | POA: Diagnosis not present

## 2018-01-02 DIAGNOSIS — C44619 Basal cell carcinoma of skin of left upper limb, including shoulder: Secondary | ICD-10-CM | POA: Diagnosis not present

## 2018-01-02 DIAGNOSIS — Z85828 Personal history of other malignant neoplasm of skin: Secondary | ICD-10-CM | POA: Diagnosis not present

## 2018-01-02 DIAGNOSIS — L570 Actinic keratosis: Secondary | ICD-10-CM | POA: Diagnosis not present

## 2018-04-26 ENCOUNTER — Telehealth: Payer: Self-pay

## 2018-04-26 ENCOUNTER — Encounter: Payer: Self-pay | Admitting: Family Medicine

## 2018-04-26 ENCOUNTER — Ambulatory Visit (INDEPENDENT_AMBULATORY_CARE_PROVIDER_SITE_OTHER): Payer: PPO | Admitting: Family Medicine

## 2018-04-26 DIAGNOSIS — J069 Acute upper respiratory infection, unspecified: Secondary | ICD-10-CM

## 2018-04-26 MED ORDER — AMOXICILLIN 500 MG PO CAPS: 500.0000 mg | ORAL_CAPSULE | Freq: Three times a day (TID) | ORAL | 0 refills | Status: DC

## 2018-04-26 MED ORDER — BENZONATATE 200 MG PO CAPS: 200.0000 mg | ORAL_CAPSULE | Freq: Three times a day (TID) | ORAL | 1 refills | Status: DC | PRN

## 2018-04-26 MED ORDER — FLUTICASONE PROPIONATE 50 MCG/ACT NA SUSP: 2.0000 | Freq: Every day | NASAL | 1 refills | Status: DC

## 2018-04-26 NOTE — Assessment & Plan Note (Signed)
D/w pt about ddx. Likely viral.  Use flonase for runny nose and tessalon for cough.   Hold amoxil for now.  Start if discolored runny nose and symptoms after 1 week.  Update [place] as needed.  He agrees.   Nontoxic.

## 2018-04-26 NOTE — Patient Instructions (Signed)
Likely viral.  Use flonase for runny nose and tessalon for cough.   Hold amoxil for now.  Start if discolored runny nose and symptoms after 1 week.  Take care.  Glad to see you.  Update [place] as needed.

## 2018-04-26 NOTE — Progress Notes (Signed)
Sx started yesterday afternoon, quick onset.   Started with scratch throat.  Then he had a cough, dry.  No sputum.  No fevers.  No aches.  No vomiting.  No diarrhea.  No ear pain.  No facial pain.  Some rhinorrhea, anterior.    He had a flu shot.  He has been visiting people in the hospital, so presumed sick contacts.   He is leaving town in a few days.   Per HPI unless specifically indicated in ROS section   Meds, vitals, and allergies reviewed.   GEN: nad, alert and oriented HEENT: mucous membranes moist, TM w/o erythema, nasal epithelium injected, OP with cobblestoning NECK: supple w/o LA CV: rrr. PULM: ctab, no inc wob ABD: soft, +bs EXT: no edema Sinuses not ttp.

## 2018-04-27 NOTE — Telephone Encounter (Signed)
[facility] Night - Client Client Site [place] - Night Contact Type Call Who Is Calling Patient / Member / Family / Caregiver Caller Name alva broxson Caller Phone Number [phone] Patient Name Hunter Chang Patient DOB 10/23/1945 Call Type Message Only Information Provided Reason for Call Request to Schedule Office Appointment Initial Comment has bad sore throat. needs to make same day appt. pls call back Additional Comment Call Closed By: Donato Heinz Transaction Date/Time: 04/26/2018 8:04:16 AM (ET)

## 2018-04-27 NOTE — Telephone Encounter (Signed)
Patient seen by Dr. Damita Dunnings on 04/26/18.

## 2018-06-12 DIAGNOSIS — L570 Actinic keratosis: Secondary | ICD-10-CM | POA: Diagnosis not present

## 2018-06-12 DIAGNOSIS — Z85828 Personal history of other malignant neoplasm of skin: Secondary | ICD-10-CM | POA: Diagnosis not present

## 2018-06-12 DIAGNOSIS — I788 Other diseases of capillaries: Secondary | ICD-10-CM | POA: Diagnosis not present

## 2018-08-23 ENCOUNTER — Other Ambulatory Visit: Payer: Self-pay | Admitting: *Deleted

## 2018-08-23 NOTE — Patient Outreach (Signed)
Late entry- 06/25/18 Telephone call to patient for follow up on [place]. Spoke with pt, explained program, pt states no needs other than wanting Advanced Directives packet,  RN CM mailed welcome letter to pt home with 24 hour nurse line magnet, pamphlet and Advanced directives packet. Per Surveyor, quantity pt to be placed in engagement program.  PLAN Outreach pt in 2 weeks to ensure receipt of advanced directives packet  Jacqlyn Larsen [facility] [place] Coordinator [phone]

## 2018-09-06 ENCOUNTER — Ambulatory Visit: Payer: Self-pay | Admitting: *Deleted

## 2018-09-19 ENCOUNTER — Other Ambulatory Visit: Payer: Self-pay | Admitting: *Deleted

## 2018-09-19 NOTE — Patient Outreach (Signed)
Case closed due to pt enrolled in external program [employer],  RN CM faxed case closure letter to primary care provider.  Case closed  Seddrick Flax RNC, BSN [facility] Coordinator [phone]   

## 2018-10-15 DIAGNOSIS — H2513 Age-related nuclear cataract, bilateral: Secondary | ICD-10-CM | POA: Diagnosis not present

## 2018-10-15 DIAGNOSIS — H40053 Ocular hypertension, bilateral: Secondary | ICD-10-CM | POA: Diagnosis not present

## 2018-10-24 ENCOUNTER — Other Ambulatory Visit: Payer: Self-pay

## 2018-12-18 DIAGNOSIS — D485 Neoplasm of uncertain behavior of skin: Secondary | ICD-10-CM | POA: Diagnosis not present

## 2018-12-18 DIAGNOSIS — Z85828 Personal history of other malignant neoplasm of skin: Secondary | ICD-10-CM | POA: Diagnosis not present

## 2018-12-18 DIAGNOSIS — L821 Other seborrheic keratosis: Secondary | ICD-10-CM | POA: Diagnosis not present

## 2018-12-18 DIAGNOSIS — D1801 Hemangioma of skin and subcutaneous tissue: Secondary | ICD-10-CM | POA: Diagnosis not present

## 2018-12-18 DIAGNOSIS — C44519 Basal cell carcinoma of skin of other part of trunk: Secondary | ICD-10-CM | POA: Diagnosis not present

## 2018-12-18 DIAGNOSIS — L570 Actinic keratosis: Secondary | ICD-10-CM | POA: Diagnosis not present

## 2018-12-18 DIAGNOSIS — L812 Freckles: Secondary | ICD-10-CM | POA: Diagnosis not present

## 2019-01-01 ENCOUNTER — Other Ambulatory Visit: Payer: Self-pay | Admitting: Family Medicine

## 2019-01-01 DIAGNOSIS — E663 Overweight: Secondary | ICD-10-CM

## 2019-01-01 NOTE — Progress Notes (Signed)
Labs ordered for CPE

## 2019-01-04 ENCOUNTER — Other Ambulatory Visit: Payer: Self-pay

## 2019-01-04 ENCOUNTER — Other Ambulatory Visit (INDEPENDENT_AMBULATORY_CARE_PROVIDER_SITE_OTHER): Payer: PPO

## 2019-01-04 DIAGNOSIS — E663 Overweight: Secondary | ICD-10-CM | POA: Diagnosis not present

## 2019-01-04 LAB — BASIC METABOLIC PANEL
BUN: 18 mg/dL (ref 6–23)
CO2: 29 mEq/L (ref 19–32)
Calcium: 9.6 mg/dL (ref 8.4–10.5)
Chloride: 103 mEq/L (ref 96–112)
Creatinine, Ser: 1.07 mg/dL (ref 0.40–1.50)
GFR: 67.84 mL/min (ref >60.00)
Glucose, Bld: 89 mg/dL (ref 70–99)
Potassium: 3.9 mEq/L (ref 3.5–5.1)
Sodium: 139 mEq/L (ref 135–145)

## 2019-01-04 LAB — LIPID PANEL
Cholesterol: 179 mg/dL (ref 0–200)
HDL: 53.1 mg/dL (ref >39.00)
LDL Cholesterol: 109 mg/dL — ABNORMAL HIGH (ref 0–99)
NonHDL: 125.5
Total CHOL/HDL Ratio: 3
Triglycerides: 83 mg/dL (ref 0.0–149.0)
VLDL: 16.6 mg/dL (ref 0.0–40.0)

## 2019-01-11 ENCOUNTER — Encounter: Payer: Self-pay | Admitting: Family Medicine

## 2019-01-11 ENCOUNTER — Ambulatory Visit (INDEPENDENT_AMBULATORY_CARE_PROVIDER_SITE_OTHER): Payer: PPO | Admitting: Family Medicine

## 2019-01-11 ENCOUNTER — Other Ambulatory Visit: Payer: Self-pay

## 2019-01-11 VITALS — BP 130/78 | HR 78 | Temp 98.7°F | Ht 67.5 in | Wt 183.4 lb

## 2019-01-11 DIAGNOSIS — Z0000 Encounter for general adult medical examination without abnormal findings: Secondary | ICD-10-CM

## 2019-01-11 NOTE — Patient Instructions (Signed)
Health Maintenance After Age 72 After age 72, you are at a higher risk for certain long-term diseases and infections as well as injuries from falls. Falls are a major cause of broken bones and head injuries in people who are older than age 72. Getting regular preventive care can help to keep you healthy and well. Preventive care includes getting regular testing and making lifestyle changes as recommended by your health care provider. Talk with your health care provider about:  Which screenings and tests you should have. A screening is a test that checks for a disease when you have no symptoms.  A diet and exercise plan that is right for you. What should I know about screenings and tests to prevent falls? Screening and testing are the best ways to find a health problem early. Early diagnosis and treatment give you the best chance of managing medical conditions that are common after age 72. Certain conditions and lifestyle choices may make you more likely to have a fall. Your health care provider may recommend:  Regular vision checks. Poor vision and conditions such as cataracts can make you more likely to have a fall. If you wear glasses, make sure to get your prescription updated if your vision changes.  Medicine review. Work with your health care provider to regularly review all of the medicines you are taking, including over-the-counter medicines. Ask your health care provider about any side effects that may make you more likely to have a fall. Tell your health care provider if any medicines that you take make you feel dizzy or sleepy.  Osteoporosis screening. Osteoporosis is a condition that causes the bones to get weaker. This can make the bones weak and cause them to break more easily.  Blood pressure screening. Blood pressure changes and medicines to control blood pressure can make you feel dizzy.  Strength and balance checks. Your health care provider may recommend certain tests to check your  strength and balance while standing, walking, or changing positions.  Foot health exam. Foot pain and numbness, as well as not wearing proper footwear, can make you more likely to have a fall.  Depression screening. You may be more likely to have a fall if you have a fear of falling, feel emotionally low, or feel unable to do activities that you used to do.  Alcohol use screening. Using too much alcohol can affect your balance and may make you more likely to have a fall. What actions can I take to lower my risk of falls? General instructions  Talk with your health care provider about your risks for falling. Tell your health care provider if: ? You fall. Be sure to tell your health care provider about all falls, even ones that seem minor. ? You feel dizzy, sleepy, or off-balance.  Take over-the-counter and prescription medicines only as told by your health care provider. These include any supplements.  Eat a healthy diet and maintain a healthy weight. A healthy diet includes low-fat dairy products, low-fat (lean) meats, and fiber from whole grains, beans, and lots of fruits and vegetables. Home safety  Remove any tripping hazards, such as rugs, cords, and clutter.  Install safety equipment such as grab bars in bathrooms and safety rails on stairs.  Keep rooms and walkways well-lit. Activity   Follow a regular exercise program to stay fit. This will help you maintain your balance. Ask your health care provider what types of exercise are appropriate for you.  If you need a cane or   walker, use it as recommended by your health care provider.  Wear supportive shoes that have nonskid soles. Lifestyle  Do not drink alcohol if your health care provider tells you not to drink.  If you drink alcohol, limit how much you have: ? 0-1 drink a day for women. ? 0-2 drinks a day for men.  Be aware of how much alcohol is in your drink. In the [place] one drink equals one typical bottle of beer (12  oz), one-half glass of wine (5 oz), or one shot of hard liquor (1 oz).  Do not use any products that contain nicotine or tobacco, such as cigarettes and e-cigarettes. If you need help quitting, ask your health care provider. Summary  Having a healthy lifestyle and getting preventive care can help to protect your health and wellness after age 72.  Screening and testing are the best way to find a health problem early and help you avoid having a fall. Early diagnosis and treatment give you the best chance for managing medical conditions that are more common for people who are older than age 72.  Falls are a major cause of broken bones and head injuries in people who are older than age 72. Take precautions to prevent a fall at home.  Work with your health care provider to learn what changes you can make to improve your health and wellness and to prevent falls. This information is not intended to replace advice given to you by your health care provider. Make sure you discuss any questions you have with your health care provider. Document Released: 01/25/2017 Document Revised: 07/05/2018 Document Reviewed: 01/25/2017 Elsevier Patient Education  2020 Elsevier Inc.  

## 2019-01-11 NOTE — Progress Notes (Signed)
Subjective:    Patient ID: Hunter Chang, male    DOB: 06-Apr-1945, 73 y.o.   MRN: [id number]  Chief Complaint  Patient presents with  . Annual Exam    no new concerns  .  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 Pt does not have any concerns or questions at this time.   Last CPE- 2018 Colonoscopy-2013 Tdap-2014 Flu-11/2018 Shingles vaccine -11/2018 Dental- 10/2018 Eye- 09/2018 Exercise- walk/speed walk every day about 30 min , play tennis, golf  Diet- wide variety home cooked food  Past Medical History:  Diagnosis Date  . Arthritis   . Chicken pox    Past Surgical History:  Procedure Laterality Date  . HERNIA REPAIR     3 times   Family History  Problem Relation Age of Onset  . Arthritis Mother   . Diabetes Father   . Early death Father   . Heart disease Father   . Early death Maternal Grandfather   . COPD Paternal Grandfather    Review of Systems  Constitutional: Negative for activity change, appetite change, fatigue and fever.  HENT: Negative for congestion, ear pain, rhinorrhea, sinus pain and sore throat.   Eyes: Negative for pain and visual disturbance.  Respiratory: Negative for cough, chest tightness, shortness of breath and wheezing.   Cardiovascular: Negative for chest pain, palpitations and leg swelling.  Gastrointestinal: Negative for abdominal distention, abdominal pain, constipation, nausea and vomiting.  Genitourinary: Negative for difficulty urinating.  Musculoskeletal: Negative.   Skin: Negative.   Neurological: Negative for dizziness, facial asymmetry, weakness, light-headedness, numbness and headaches.       BP 130/78 (BP Location: Left Arm, Patient Position: Sitting, Cuff Size: Normal)   Pulse 78   Temp 98.7 F (37.1 C) (Temporal)   Ht 5' 7.5" (1.715 m)   Wt 83.2 kg   SpO2 96%   BMI 28.30 kg/m  Objective:   Physical Exam Constitutional:      General: He is not in acute distress.    Appearance: Normal appearance. He is not  ill-appearing.  HENT:     Head: Normocephalic and atraumatic.     Right Ear: Tympanic membrane, ear canal and external ear normal. There is no impacted cerumen.     Left Ear: Ear canal and external ear normal. There is no impacted cerumen.     Nose: No congestion or rhinorrhea.     Mouth/Throat:     Mouth: Mucous membranes are moist.     Pharynx: Oropharynx is clear. No oropharyngeal exudate or posterior oropharyngeal erythema.  Eyes:     General: No scleral icterus.       Right eye: No discharge.        Left eye: No discharge.     Extraocular Movements: Extraocular movements intact.     Conjunctiva/sclera: Conjunctivae normal.     Pupils: Pupils are equal, round, and reactive to light.  Neck:     Musculoskeletal: Normal range of motion.  Cardiovascular:     Rate and Rhythm: Normal rate and regular rhythm.     Pulses: Normal pulses.     Heart sounds: Normal heart sounds.  Pulmonary:     Effort: Pulmonary effort is normal.     Breath sounds: Normal breath sounds.  Abdominal:     General: Bowel sounds are normal.     Palpations: Abdomen is soft.     Tenderness: There is no abdominal tenderness.  Musculoskeletal: Normal range of motion.  General: No swelling or tenderness.     Right lower leg: No edema.     Left lower leg: No edema.  Skin:    General: Skin is warm and dry.  Neurological:     General: No focal deficit present.     Mental Status: He is alert and oriented to person, place, and time.     Motor: No weakness.  Psychiatric:        Mood and Affect: Mood normal.        Behavior: Behavior normal.        Thought Content: Thought content normal.       Assessment & Plan:  1. Annual physical exam Routine unremarkable exam

## 2019-01-11 NOTE — Progress Notes (Signed)
   Subjective:    Patient ID: Hunter Chang, male    DOB: 1945-08-07, 73 y.o.   MRN: [id number]  HPI This is a  73 yo male who presents today for annual exam. Has been doing well through pandemic. He is working part time at [employer]. Not working at [employer] a anymore.   Last CPE- 03/2017 Colonoscopy- 02/26/2012 Tdap- 06/05/2012 Flu- annual, has already had this year Dental- regular Eye-regular Exercise- tennis, golf  Past Medical History:  Diagnosis Date  . Arthritis   . Chicken pox    Past Surgical History:  Procedure Laterality Date  . HERNIA REPAIR     3 times   Family History  Problem Relation Age of Onset  . Arthritis Mother   . Diabetes Father   . Early death Father   . Heart disease Father   . Early death Maternal Grandfather   . COPD Paternal Grandfather    Social History   Tobacco Use  . Smoking status: Never Smoker  . Smokeless tobacco: Never Used  Substance Use Topics  . Alcohol use: No  . Drug use: No     Review of Systems     Objective:   Physical Exam Vitals signs reviewed.  Constitutional:      General: He is not in acute distress.    Appearance: Normal appearance. He is normal weight. He is not ill-appearing, toxic-appearing or diaphoretic.  HENT:     Head: Normocephalic and atraumatic.     Right Ear: External ear normal.     Left Ear: External ear normal.  Eyes:     Conjunctiva/sclera: Conjunctivae normal.  Neck:     Musculoskeletal: Normal range of motion and neck supple.  Cardiovascular:     Rate and Rhythm: Normal rate.  Pulmonary:     Effort: Pulmonary effort is normal.  Musculoskeletal:     Right lower leg: No edema.     Left lower leg: No edema.  Skin:    General: Skin is warm and dry.  Neurological:     Mental Status: He is alert and oriented to person, place, and time.  Psychiatric:        Mood and Affect: Mood normal.        Behavior: Behavior normal.        Thought Content: Thought content normal.      Judgment: Judgment normal.       BP 130/78 (BP Location: Left Arm, Patient Position: Sitting, Cuff Size: Normal)   Pulse 78   Temp 98.7 F (37.1 C) (Temporal)   Ht 5' 7.5" (1.715 m)   Wt 183 lb 6.4 oz (83.2 kg)   SpO2 96%   BMI 28.30 kg/m  Wt Readings from Last 3 Encounters:  01/11/19 183 lb 6.4 oz (83.2 kg)  04/07/17 182 lb (82.6 kg)  03/03/17 179 lb 1.9 oz (81.2 kg)       Assessment & Plan:  1. Annual physical exam - labs done previously and reviewed by patient - follow up in 1 year, sooner if any problems arise   Clarene Reamer, FNP-BC  [facility], [place]  01/11/2019 10:06 AM

## 2019-03-18 ENCOUNTER — Ambulatory Visit: Payer: PPO | Attending: Internal Medicine

## 2019-03-18 DIAGNOSIS — Z20828 Contact with and (suspected) exposure to other viral communicable diseases: Secondary | ICD-10-CM | POA: Diagnosis not present

## 2019-03-18 DIAGNOSIS — Z20822 Contact with and (suspected) exposure to covid-19: Secondary | ICD-10-CM

## 2019-03-19 LAB — NOVEL CORONAVIRUS, NAA: SARS-CoV-2, NAA: NOT DETECTED

## 2019-04-23 DIAGNOSIS — H40053 Ocular hypertension, bilateral: Secondary | ICD-10-CM | POA: Diagnosis not present

## 2019-06-17 DIAGNOSIS — Z85828 Personal history of other malignant neoplasm of skin: Secondary | ICD-10-CM | POA: Diagnosis not present

## 2019-06-17 DIAGNOSIS — L570 Actinic keratosis: Secondary | ICD-10-CM | POA: Diagnosis not present

## 2019-07-18 ENCOUNTER — Telehealth: Payer: Self-pay

## 2019-07-18 NOTE — Telephone Encounter (Signed)
ok 

## 2019-07-18 NOTE — Telephone Encounter (Signed)
Patient's wife, [name], requesting patient transfer care to Dr Quay Burow. Wife states that she got the okay in her appointment from Dr Quay Burow. Needing the okay in the chart of documentation and appointment scheduling purposes. Are you okay with this transfer?

## 2019-07-19 NOTE — Telephone Encounter (Signed)
That is fine with me.

## 2019-07-19 NOTE — Telephone Encounter (Signed)
LVM to schedule TOC at patient's convenience.

## 2019-12-12 DIAGNOSIS — H40053 Ocular hypertension, bilateral: Secondary | ICD-10-CM | POA: Diagnosis not present

## 2019-12-12 DIAGNOSIS — H2513 Age-related nuclear cataract, bilateral: Secondary | ICD-10-CM | POA: Diagnosis not present

## 2019-12-18 DIAGNOSIS — L820 Inflamed seborrheic keratosis: Secondary | ICD-10-CM | POA: Diagnosis not present

## 2019-12-18 DIAGNOSIS — Z85828 Personal history of other malignant neoplasm of skin: Secondary | ICD-10-CM | POA: Diagnosis not present

## 2019-12-18 DIAGNOSIS — L821 Other seborrheic keratosis: Secondary | ICD-10-CM | POA: Diagnosis not present

## 2019-12-18 DIAGNOSIS — D1801 Hemangioma of skin and subcutaneous tissue: Secondary | ICD-10-CM | POA: Diagnosis not present

## 2019-12-18 DIAGNOSIS — L570 Actinic keratosis: Secondary | ICD-10-CM | POA: Diagnosis not present

## 2020-02-18 DIAGNOSIS — Z85828 Personal history of other malignant neoplasm of skin: Secondary | ICD-10-CM | POA: Diagnosis not present

## 2020-02-18 DIAGNOSIS — D492 Neoplasm of unspecified behavior of bone, soft tissue, and skin: Secondary | ICD-10-CM | POA: Diagnosis not present

## 2020-03-11 DIAGNOSIS — D485 Neoplasm of uncertain behavior of skin: Secondary | ICD-10-CM | POA: Insufficient documentation

## 2020-03-11 DIAGNOSIS — Z9889 Other specified postprocedural states: Secondary | ICD-10-CM | POA: Diagnosis not present

## 2020-03-11 DIAGNOSIS — Z888 Allergy status to other drugs, medicaments and biological substances status: Secondary | ICD-10-CM | POA: Diagnosis not present

## 2020-04-06 DIAGNOSIS — C490 Malignant neoplasm of connective and soft tissue of head, face and neck: Secondary | ICD-10-CM | POA: Diagnosis not present

## 2020-04-06 DIAGNOSIS — L570 Actinic keratosis: Secondary | ICD-10-CM | POA: Diagnosis not present

## 2020-04-06 DIAGNOSIS — D485 Neoplasm of uncertain behavior of skin: Secondary | ICD-10-CM | POA: Diagnosis not present

## 2020-04-06 DIAGNOSIS — D481 Neoplasm of uncertain behavior of connective and other soft tissue: Secondary | ICD-10-CM | POA: Diagnosis not present

## 2020-04-07 DIAGNOSIS — T8131XA Disruption of external operation (surgical) wound, not elsewhere classified, initial encounter: Secondary | ICD-10-CM | POA: Diagnosis not present

## 2020-04-15 DIAGNOSIS — D485 Neoplasm of uncertain behavior of skin: Secondary | ICD-10-CM | POA: Diagnosis not present

## 2020-05-06 DIAGNOSIS — D485 Neoplasm of uncertain behavior of skin: Secondary | ICD-10-CM | POA: Diagnosis not present

## 2020-05-06 DIAGNOSIS — Z483 Aftercare following surgery for neoplasm: Secondary | ICD-10-CM | POA: Diagnosis not present

## 2020-05-06 DIAGNOSIS — L98491 Non-pressure chronic ulcer of skin of other sites limited to breakdown of skin: Secondary | ICD-10-CM | POA: Diagnosis not present

## 2020-05-20 DIAGNOSIS — D485 Neoplasm of uncertain behavior of skin: Secondary | ICD-10-CM | POA: Diagnosis not present

## 2020-06-04 DIAGNOSIS — Z9889 Other specified postprocedural states: Secondary | ICD-10-CM | POA: Diagnosis not present

## 2020-06-04 DIAGNOSIS — C4449 Other specified malignant neoplasm of skin of scalp and neck: Secondary | ICD-10-CM | POA: Diagnosis not present

## 2020-06-04 DIAGNOSIS — C490 Malignant neoplasm of connective and soft tissue of head, face and neck: Secondary | ICD-10-CM | POA: Diagnosis not present

## 2020-06-12 DIAGNOSIS — Z9889 Other specified postprocedural states: Secondary | ICD-10-CM | POA: Diagnosis not present

## 2020-06-12 DIAGNOSIS — D485 Neoplasm of uncertain behavior of skin: Secondary | ICD-10-CM | POA: Diagnosis not present

## 2020-06-16 DIAGNOSIS — L570 Actinic keratosis: Secondary | ICD-10-CM | POA: Diagnosis not present

## 2020-06-16 DIAGNOSIS — D485 Neoplasm of uncertain behavior of skin: Secondary | ICD-10-CM | POA: Diagnosis not present

## 2020-07-03 DIAGNOSIS — H40053 Ocular hypertension, bilateral: Secondary | ICD-10-CM | POA: Diagnosis not present

## 2020-07-20 DIAGNOSIS — D485 Neoplasm of uncertain behavior of skin: Secondary | ICD-10-CM | POA: Diagnosis not present

## 2020-07-20 DIAGNOSIS — D210 Benign neoplasm of connective and other soft tissue of head, face and neck: Secondary | ICD-10-CM | POA: Diagnosis not present

## 2020-07-28 DIAGNOSIS — Z888 Allergy status to other drugs, medicaments and biological substances status: Secondary | ICD-10-CM | POA: Diagnosis not present

## 2020-07-28 DIAGNOSIS — Z9889 Other specified postprocedural states: Secondary | ICD-10-CM | POA: Diagnosis not present

## 2020-07-28 DIAGNOSIS — Z4889 Encounter for other specified surgical aftercare: Secondary | ICD-10-CM | POA: Diagnosis not present

## 2020-08-18 DIAGNOSIS — D485 Neoplasm of uncertain behavior of skin: Secondary | ICD-10-CM | POA: Diagnosis not present

## 2020-08-25 ENCOUNTER — Telehealth: Payer: PPO | Admitting: Nurse Practitioner

## 2020-08-25 DIAGNOSIS — U071 COVID-19: Secondary | ICD-10-CM | POA: Diagnosis not present

## 2020-08-25 MED ORDER — NAPROXEN 500 MG PO TABS: 500.0000 mg | ORAL_TABLET | Freq: Two times a day (BID) | ORAL | 0 refills | Status: DC

## 2020-08-25 MED ORDER — BENZONATATE 100 MG PO CAPS: 100.0000 mg | ORAL_CAPSULE | Freq: Three times a day (TID) | ORAL | 0 refills | Status: DC | PRN

## 2020-08-25 NOTE — Progress Notes (Signed)
E-Visit for Positive Covid Test Result We are sorry you are not feeling well. We are here to help!  You have tested positive for COVID-19, meaning that you were infected with the novel coronavirus and could give the virus to others.  It is vitally important that you stay home so you do not spread it to others.      Please continue isolation at home, for at least 10 days since the start of your symptoms and until you have had 24 hours with no fever (without taking a fever reducer) and with improving of symptoms.  If you have no symptoms but tested positive (or all symptoms resolve after 5 days and you have no fever) you can leave your house but continue to wear a mask around others for an additional 5 days. If you have a fever,continue to stay home until you have had 24 hours of no fever. Most cases improve 5-10 days from onset but we have seen a small number of patients who have gotten worse after the 10 days.  Please be sure to watch for worsening symptoms and remain taking the proper precautions.   Go to the nearest hospital ED for assessment if fever/cough/breathlessness are severe or illness seems like a threat to life.    The following symptoms may appear 2-14 days after exposure: . Fever . Cough . Shortness of breath or difficulty breathing . Chills . Repeated shaking with chills . Muscle pain . Headache . Sore throat . New loss of taste or smell . Fatigue . Congestion or runny nose . Nausea or vomiting . Diarrhea  You have been enrolled in [facility] for COVID-19. Daily you will receive a questionnaire within the [facility] website. Our COVID-19 response team will be monitoring your responses daily.  You can use medication such as prescription cough medication called Tessalon Perles 100 mg. You may take 1-2 capsules every 8 hours as needed for cough and prescription anti-inflammatory called Naprosyn 500 mg. Take twice daily as needed for fever or body aches for 2  weeks you have no comorbid conditions and reallyu do not qualify for the new antivirals You may also take acetaminophen (Tylenol) as needed for fever.  HOME CARE: . Only take medications as instructed by your medical team. . Drink plenty of fluids and get plenty of rest. . A steam or ultrasonic humidifier can help if you have congestion.   GET HELP RIGHT AWAY IF YOU HAVE EMERGENCY WARNING SIGNS.  Call 911 or proceed to your closest emergency facility if: . You develop worsening high fever. . Trouble breathing . Bluish lips or face . Persistent pain or pressure in the chest . New confusion . Inability to wake or stay awake . You cough up blood. . Your symptoms become more severe . Inability to hold down food or fluids  This list is not all possible symptoms. Contact your medical provider for any symptoms that are severe or concerning to you.    Your e-visit answers were reviewed by a board certified advanced clinical practitioner to complete your personal care plan.  Depending on the condition, your plan could have included both over the counter or prescription medications.  If there is a problem please reply once you have received a response from your provider.  Your safety is important to [place].  If you have drug allergies check your prescription carefully.    You can use MyChart to ask questions about today's visit, request a non-urgent call back, or  ask for a work or school excuse for 24 hours related to this e-Visit. If it has been greater than 24 hours you will need to follow up with your provider, or enter a new e-Visit to address those concerns. You will get an e-mail in the next two days asking about your experience.  I hope that your e-visit has been valuable and will speed your recovery. Thank you for using e-visits.    5-10 minutes spent reviewing and documenting in chart.

## 2020-08-26 ENCOUNTER — Other Ambulatory Visit: Payer: Self-pay

## 2020-08-26 ENCOUNTER — Encounter: Payer: Self-pay | Admitting: Internal Medicine

## 2020-08-26 ENCOUNTER — Telehealth (INDEPENDENT_AMBULATORY_CARE_PROVIDER_SITE_OTHER): Payer: PPO | Admitting: Internal Medicine

## 2020-08-26 DIAGNOSIS — U071 COVID-19: Secondary | ICD-10-CM | POA: Diagnosis not present

## 2020-08-26 MED ORDER — MOLNUPIRAVIR 200 MG PO CAPS: 800.0000 mg | ORAL_CAPSULE | Freq: Two times a day (BID) | ORAL | 0 refills | Status: AC

## 2020-08-26 NOTE — Assessment & Plan Note (Signed)
Acute Symptoms mild in nature Given his age he is at a higher risk He is requesting an antiviral, which I think is reasonable given his age and mild symptoms He is aware that there is no approved treatment for COVID-any antivirals or for emergency use We will start Molnupiravir 800 mg twice daily x5 days Discussed possible side effects Continue over-the-counter cold medications for symptom relief He will call with any questions or concerns or if his symptoms worsen/do not improve

## 2020-08-26 NOTE — Progress Notes (Signed)
Virtual Visit via Video Note  I connected with Hunter Chang on 08/26/20 at  3:40 PM EDT by a video enabled telemedicine application and verified that I am speaking with the correct person using two identifiers.   I discussed the limitations of evaluation and management by telemedicine and the availability of in person appointments. The patient expressed understanding and agreed to proceed.  Present for the visit:  Myself, Dr Billey Gosling, Hunter Chang and his wife Hunter Chang.  The patient is currently at home and I am in the office.    No referring provider.    History of Present Illness: This is an acute visit for covid  His symptoms started Saturday morning, 4 days ago.  His wife also has COVID and was diagnosed just before him and she is currently on an antiviral.  Symptoms have been mild.  He states persistent low-grade fever, fatigue and scratchy throat.  He also has a cough and postnasal drip.  He denies any headaches, shortness of breath or loss of taste/smell.  Because of his age and concerns with symptoms getting worse he does want to be put on an antiviral.  He is currently taking over-the-counter cold medications for his symptoms.   Review of Systems  Constitutional: Positive for fever (low grade) and malaise/fatigue.  HENT: Positive for sore throat (scratchy). Negative for congestion.        Pnd  Respiratory: Positive for cough. Negative for sputum production, shortness of breath and wheezing.   Gastrointestinal: Negative for diarrhea and nausea.  Musculoskeletal: Negative for myalgias.  Neurological: Negative for dizziness and headaches.      Social History   Socioeconomic History  . Marital status: Married    Spouse name: Not on file  . Number of children: Not on file  . Years of education: Not on file  . Highest education level: Not on file  Occupational History  . Not on file  Tobacco Use  . Smoking status: Never Smoker  . Smokeless tobacco: Never Used  Substance and  Sexual Activity  . Alcohol use: No  . Drug use: No  . Sexual activity: Yes  Other Topics Concern  . Not on file  Social History Narrative  . Not on file   Social Determinants of Health   Financial Resource Strain: Not on file  Food Insecurity: Not on file  Transportation Needs: Not on file  Physical Activity: Not on file  Stress: Not on file  Social Connections: Not on file     Observations/Objective: Appears well in NAD Breathing normally  Assessment and Plan:  See Problem List for Assessment and Plan of chronic medical problems.   Follow Up Instructions:    I discussed the assessment and treatment plan with the patient. The patient was provided an opportunity to ask questions and all were answered. The patient agreed with the plan and demonstrated an understanding of the instructions.   The patient was advised to call back or seek an in-person evaluation if the symptoms worsen or if the condition fails to improve as anticipated.    Binnie Rail, MD

## 2020-09-01 DIAGNOSIS — C44519 Basal cell carcinoma of skin of other part of trunk: Secondary | ICD-10-CM | POA: Diagnosis not present

## 2020-09-01 DIAGNOSIS — Z85828 Personal history of other malignant neoplasm of skin: Secondary | ICD-10-CM | POA: Diagnosis not present

## 2020-09-01 DIAGNOSIS — L570 Actinic keratosis: Secondary | ICD-10-CM | POA: Diagnosis not present

## 2020-09-01 DIAGNOSIS — L821 Other seborrheic keratosis: Secondary | ICD-10-CM | POA: Diagnosis not present

## 2020-09-01 DIAGNOSIS — D485 Neoplasm of uncertain behavior of skin: Secondary | ICD-10-CM | POA: Diagnosis not present

## 2020-09-01 NOTE — Progress Notes (Signed)
Oncology Nurse Navigator Documentation  Placed introductory call to new referral patient Hunter Chang.   Introduced myself as the H&N oncology nurse navigator that works with Dr. Isidore Moos to whom he has been referred by Dr. Nicolette Bang. He confirmed understanding of referral.  Briefly explained my role as his navigator, provided my contact information.   Confirmed understanding of upcoming appts and [place] location, explained arrival and registration process.  I encouraged him to call with questions/concerns as he moves forward with appts and procedures.    He verbalized understanding of information provided, expressed appreciation for my call.   Navigator Initial Assessment . Employment Status: he is a Environmental education officer . Currently on FMLA / STD: no . Living Situation: He lives with his wife.  . Support System: family and friends.  [name] Kitchen PCP: Billey Gosling MD . PCD: NA . Financial Concerns:no . Transportation Needs: no . Sensory Deficits:no . Language Barriers/Interpreter Needed:  no . Ambulation Needs: no . DME Used in Home: no . Psychosocial Needs:  no . Concerns/Needs Understanding Cancer:  addressed/answered by navigator to best of ability . Self-Expressed Needs: no   Harlow Asa RN, BSN, OCN Head & Neck Oncology Nurse [place] [facility] Phone # [phone]  Fax # [phone]

## 2020-09-01 NOTE — Progress Notes (Signed)
Radiation Oncology         [phone] ________________________________  Initial Outpatient Consultation by telephone.  The patient opted for telemedicine to maximize safety during the pandemic (patient and wife recovering from recent infection).  MyChart video was not obtainable.   Name: Hunter Chang MRN: [id number]  Date: 09/02/2020  DOB: 10/14/1945  BJ:SEGBT, Claudina Lick, MD  Francina Ames, MD  Dr Jarome Matin Dr. 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 Atypical fibroxanthoma of skin of scalp  D48.5   2. Unspecified malignant neoplasm of skin of scalp and neck  C44.40     CHIEF COMPLAINT: Here to discuss the management of his atypical fibroxanthoma of the scalp  HISTORY OF PRESENT ILLNESS::Hunter Chang is a 75 y.o. 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 He underwent primary resection at the time (Mohs, Sarajane Jews). In summer 2019 a recurrence was diagnosed and he underwent re-resection (Mohs, Sarajane Jews). Another recurrence appeared in fall 2021 at the same area.  Subsequently, the patient saw Dr. Nicolette Bang who performed skin excision to the effected area of scalp along with resectioning, includinging the outer table of the calvarium on 04/06/20; with integra and wound vac placement.   Biopsy (skin excision) of the skin scalp lesion in the 12 o'clock region from the procedure on 04/06/20 revealed: Atypical fibroxanthoma, extending to the deep margin. The tumor measured 2.2 cm in its greatest dimension.   The calvarium eventually became exposed around the tumor bed, and further burring/integra was placed on 06/04/20. He dressed this for several weeks and then underwent STSG (split thickness skin graft from leg) on 07/20/20. The  patient followed up with Dr. Nicolette Bang on 08/18/20 in which is was noted that the patient has had no problems in regards to the skin graft and has been doing well overall, with only minor pain. No parotid or neck masses/nodules were noted to be present as well.   [place] [facility] recommended adjuvant RT to the scalp given his condition's multiply recurrent history and aggressive behavior  Of note: the patient tested positive for COVID-19 on 08/25/20. His symptoms were noted to have been mild.  Th patient stated that he had a persistent low-grade fever, fatigue and scratchy throat.  He also had a cough and postnasal drip.  He denied having any headaches, shortness of breath or loss of taste/smell. He consulted with Dr. Quay Burow (internal medicine) who prescribed him Molnupiravir 800 mg to take twice daily x5 days.  Swallowing issues, if any: N/A  Weight Changes: N/A  Other symptoms: Noted that the patient recently tested positive with COVID-19 in which he experienced a persistent low-grade fever, fatigue and scratchy throat accompanied with cough and postnasal drip.   Tobacco history, if any: Never  ETOH abuse, if any: Does not drink, nor has he ever.  Not claustrophobic.   PHOTOS FROM DR Nicolette Bang:  PRE-OP:   INTRA-OP:   POST-OP:    PREVIOUS RADIATION THERAPY: No  PAST MEDICAL HISTORY:  has a past medical history of Arthritis and Chicken pox.    PAST SURGICAL HISTORY: Past Surgical History:  Procedure Laterality Date  . HERNIA REPAIR     3 times    FAMILY HISTORY: family history includes Arthritis in his mother;  COPD in his paternal grandfather; Diabetes in his father; Early death in his father and maternal grandfather; Heart disease in his father.  SOCIAL HISTORY:  reports that he has never smoked. He has never used smokeless tobacco. He reports that he does not drink alcohol and does not use drugs.  ALLERGIES: Succinylcholine chloride  MEDICATIONS:  Current  Outpatient Medications  Medication Sig Dispense Refill  . aspirin 81 MG EC tablet Take 81 mg by mouth daily. Swallow whole.    . Multiple Vitamins-Minerals (ONE-A-DAY MENS 50+ ADVANTAGE) TABS Take 1 tablet by mouth daily.     No current facility-administered medications for this encounter.    REVIEW OF SYSTEMS:  Notable for that above.   PHYSICAL EXAM:  vitals were not taken for this visit.   General: Alert and oriented, in no acute distress    LABORATORY DATA:  Lab Results  Component Value Date   WBC 5.6 03/03/2017   HGB 15.4 03/03/2017   HCT 44.9 03/03/2017   MCV 91.4 03/03/2017   PLT 212.0 03/03/2017   CMP     Component Value Date/Time   NA 139 01/04/2019 0932   K 3.9 01/04/2019 0932   CL 103 01/04/2019 0932   CO2 29 01/04/2019 0932   GLUCOSE 89 01/04/2019 0932   BUN 18 01/04/2019 0932   CREATININE 1.07 01/04/2019 0932   CALCIUM 9.6 01/04/2019 0932   PROT 6.9 03/03/2017 1407   ALBUMIN 4.2 03/03/2017 1407   AST 31 03/03/2017 1407   ALT 20 03/03/2017 1407   ALKPHOS 86 03/03/2017 1407   BILITOT 0.9 03/03/2017 1407      No results found for: TSH   RADIOGRAPHY: No results found.    PATHOLOGY: 04/06/20  Final Pathologic Diagnosis    A. SKIN, SCALP MARGIN 12- 2 O' CLOCK ( FROZEN ), EXCISION:              No malignancy identified.   B. SKIN, SCALP MARGIN 2 -4 O' CLOCK ( FROZEN ), EXCISION:              No malignancy identified.   C. SKIN, SCALP MARGIN 6 - 8 O' CLOCK ( FROZEN ), EXCISION:              Actinic keratosis.  No malignancy identified.   D. SKIN, SCALP MARGIN 8 - 10  O' CLOCK ( FROZEN ), EXCISION:              Actinic keratosis.             No malignancy identified.   E. SKIN, SCALP MARGIN 10 - 12  O' CLOCK ( FROZEN ), EXCISION:              No malignancy identified.   F. SKIN, SCALP MARGIN 4 - 6  O' CLOCK ( FROZEN ), EXCISION:              No malignancy identified.   G. SKIN, SCALP LESION , STITCH = 12 O'CLOCK ( ANTERIOR ), EXCISION:               Atypical fibroxanthoma, extending to the deep margin.               Tumor measure 2.2 cm in greatest dimension (by gross description).      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  We discussed the paucity of data regarding adjuvant radiation therapy for this condition.  We  discussed aggressive nature of his particular condition and the rationale for postoperative radiation therapy.  We also reviewed data regarding adjuvant radiation therapy for more common skin conditions, ie carcinomas. We discussed the potential risks, benefits, and side effects of radiotherapy. We talked in detail about acute and late effects. We discussed that some of the most bothersome acute effects may be  skin crusting, bleeding, scabbing, irritation; hair loss, fatigue. We talked about late effects which include but are not necessarily limited to potential injury to any of the tissues in the head and neck region including skin, soft tissue, skull, or brain. No guarantees of treatment were given. The patient is enthusiastic about proceeding with treatment. I look forward to participating in the patient's care.    Simulation (treatment planning) will take place in the near future.  Anticipate he will receive 4 weeks of hypofractionated radiation therapy with electrons.    This encounter was provided by telemedicine platform; patient desired telemedicine during pandemic precautions.  MyChart video was not available and therefore telephone was used. The patient has given verbal consent for this type of encounter and has been advised to only accept a meeting of this type in a secure network environment. On date of service, in total, I spent 45 minutes on this encounter, including review of records, discussion with patient, coordination of care, and documentation.   The attendants for this meeting include Eppie Gibson  and Donnalee Curry During the  encounter, Eppie Gibson was located at [facility] Radiation Oncology Department.  Juandaniel Manfredo was located at home.  __________________________________________   Eppie Gibson, MD  This document serves as a record of services personally performed by Eppie Gibson, MD. It was created on her behalf by Roney Mans, a trained medical scribe. The creation of this record is based on the scribe's personal observations and the provider's statements to them. This document has been checked and approved by the attending provider.

## 2020-09-02 ENCOUNTER — Ambulatory Visit
Admission: RE | Admit: 2020-09-02 | Discharge: 2020-09-02 | Disposition: A | Discharge status: Home / Self Care | Payer: PPO | Source: Ambulatory Visit | Attending: Radiation Oncology | Admitting: Radiation Oncology

## 2020-09-02 ENCOUNTER — Other Ambulatory Visit: Payer: Self-pay

## 2020-09-02 ENCOUNTER — Encounter: Payer: Self-pay | Admitting: Radiation Oncology

## 2020-09-02 DIAGNOSIS — C490 Malignant neoplasm of connective and soft tissue of head, face and neck: Secondary | ICD-10-CM | POA: Diagnosis not present

## 2020-09-02 DIAGNOSIS — C4440 Unspecified malignant neoplasm of skin of scalp and neck: Secondary | ICD-10-CM | POA: Insufficient documentation

## 2020-09-02 DIAGNOSIS — D485 Neoplasm of uncertain behavior of skin: Secondary | ICD-10-CM

## 2020-09-02 NOTE — Progress Notes (Signed)
Head and Neck Cancer Location of Tumor / Histology:  Atypical fibroxanthoma on the vertex of the scalp   Patient presented with symptoms of: Per Dr. 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 He underwent primary resection at the time. In summer 2019 a recurrence was diagnosed and he underwent re-resection. Another recurrence appeared in fall 2021 at the same area. Two other smaller lesions. He underwent resection, including the outer table of the calvarium on 04/06/20, with integra and wound vac placement. The calvarium became exposed around the tumor bed, and further burring/integra was placed on 06/04/20. The bolster was removed on 3/22 and he has been dressing with wet to dry since then.  Biopsies revealed:  04/06/2020   Past skin cancers, if any: Patient denies  History of Blistering sunburns, if any: Patient denies  Referrals Yes No Comments  Social Work? []  [x]    Dentistry? []  [x]    Swallowing therapy? []  [x]    Nutrition? []  [x]    Med/Onc? []  [x]     Safety Issues Yes No Comments  Prior radiation? []  [x]    Pacemaker/ICD? []  [x]    Possible current pregnancy? []  [x]  N/A  Is the patient on methotrexate? []  [x]     Tobacco/Marijuana/Snuff/ETOH use: Patient has never smoked, used smokeless tobacco, drank alcohol, or used recreational drugs  Past/Anticipated interventions by otolaryngology, if any:  07/20/2020 Dr. Francina Ames --Preparation of scalp defect for graft --Split thickness skin graft from right thigh to scalp defect, 3 in x 3.5 in  04/06/2020 Dr. Francina Ames   Past/Anticipated interventions by medical oncology, if any:  No referral indicated at this time  Current Complaints / other details:  Patient and wife currently recovering from COVID-19 virus

## 2020-09-07 ENCOUNTER — Ambulatory Visit
Admission: RE | Admit: 2020-09-07 | Discharge: 2020-09-07 | Disposition: A | Discharge status: Home / Self Care | Payer: PPO | Source: Ambulatory Visit | Attending: Radiation Oncology | Admitting: Radiation Oncology

## 2020-09-07 ENCOUNTER — Other Ambulatory Visit: Payer: Self-pay

## 2020-09-07 DIAGNOSIS — Z510 Encounter for antineoplastic radiation therapy: Secondary | ICD-10-CM | POA: Insufficient documentation

## 2020-09-07 DIAGNOSIS — C490 Malignant neoplasm of connective and soft tissue of head, face and neck: Secondary | ICD-10-CM | POA: Diagnosis not present

## 2020-09-07 DIAGNOSIS — C4440 Unspecified malignant neoplasm of skin of scalp and neck: Secondary | ICD-10-CM | POA: Insufficient documentation

## 2020-09-07 DIAGNOSIS — D485 Neoplasm of uncertain behavior of skin: Secondary | ICD-10-CM | POA: Insufficient documentation

## 2020-09-15 DIAGNOSIS — C490 Malignant neoplasm of connective and soft tissue of head, face and neck: Secondary | ICD-10-CM | POA: Diagnosis not present

## 2020-09-15 DIAGNOSIS — Z510 Encounter for antineoplastic radiation therapy: Secondary | ICD-10-CM | POA: Diagnosis not present

## 2020-09-16 ENCOUNTER — Ambulatory Visit: Payer: PPO | Admitting: Radiation Oncology

## 2020-09-16 ENCOUNTER — Other Ambulatory Visit: Payer: Self-pay

## 2020-09-16 ENCOUNTER — Ambulatory Visit
Admission: RE | Admit: 2020-09-16 | Discharge: 2020-09-16 | Disposition: A | Discharge status: Home / Self Care | Payer: PPO | Source: Ambulatory Visit | Attending: Radiation Oncology | Admitting: Radiation Oncology

## 2020-09-16 DIAGNOSIS — Z510 Encounter for antineoplastic radiation therapy: Secondary | ICD-10-CM | POA: Diagnosis not present

## 2020-09-16 DIAGNOSIS — C490 Malignant neoplasm of connective and soft tissue of head, face and neck: Secondary | ICD-10-CM | POA: Diagnosis not present

## 2020-09-17 ENCOUNTER — Ambulatory Visit
Admission: RE | Admit: 2020-09-17 | Discharge: 2020-09-17 | Disposition: A | Discharge status: Home / Self Care | Payer: PPO | Source: Ambulatory Visit | Attending: Radiation Oncology | Admitting: Radiation Oncology

## 2020-09-17 DIAGNOSIS — Z510 Encounter for antineoplastic radiation therapy: Secondary | ICD-10-CM | POA: Diagnosis not present

## 2020-09-18 ENCOUNTER — Ambulatory Visit
Admission: RE | Admit: 2020-09-18 | Discharge: 2020-09-18 | Disposition: A | Discharge status: Home / Self Care | Payer: PPO | Source: Ambulatory Visit | Attending: Radiation Oncology | Admitting: Radiation Oncology

## 2020-09-18 ENCOUNTER — Other Ambulatory Visit: Payer: Self-pay

## 2020-09-18 DIAGNOSIS — Z510 Encounter for antineoplastic radiation therapy: Secondary | ICD-10-CM | POA: Diagnosis not present

## 2020-09-21 ENCOUNTER — Ambulatory Visit
Admission: RE | Admit: 2020-09-21 | Discharge: 2020-09-21 | Disposition: A | Discharge status: Home / Self Care | Payer: PPO | Source: Ambulatory Visit | Attending: Radiation Oncology | Admitting: Radiation Oncology

## 2020-09-21 ENCOUNTER — Other Ambulatory Visit: Payer: Self-pay

## 2020-09-21 DIAGNOSIS — Z510 Encounter for antineoplastic radiation therapy: Secondary | ICD-10-CM | POA: Diagnosis not present

## 2020-09-22 ENCOUNTER — Ambulatory Visit
Admission: RE | Admit: 2020-09-22 | Discharge: 2020-09-22 | Disposition: A | Discharge status: Home / Self Care | Payer: PPO | Source: Ambulatory Visit | Attending: Radiation Oncology | Admitting: Radiation Oncology

## 2020-09-22 ENCOUNTER — Other Ambulatory Visit: Payer: Self-pay

## 2020-09-22 DIAGNOSIS — C490 Malignant neoplasm of connective and soft tissue of head, face and neck: Secondary | ICD-10-CM | POA: Diagnosis not present

## 2020-09-22 DIAGNOSIS — Z510 Encounter for antineoplastic radiation therapy: Secondary | ICD-10-CM | POA: Diagnosis not present

## 2020-09-23 ENCOUNTER — Other Ambulatory Visit: Payer: Self-pay

## 2020-09-23 ENCOUNTER — Ambulatory Visit
Admission: RE | Admit: 2020-09-23 | Discharge: 2020-09-23 | Disposition: A | Discharge status: Home / Self Care | Payer: PPO | Source: Ambulatory Visit | Attending: Radiation Oncology | Admitting: Radiation Oncology

## 2020-09-23 DIAGNOSIS — Z510 Encounter for antineoplastic radiation therapy: Secondary | ICD-10-CM | POA: Diagnosis not present

## 2020-09-24 ENCOUNTER — Ambulatory Visit
Admission: RE | Admit: 2020-09-24 | Discharge: 2020-09-24 | Disposition: A | Discharge status: Home / Self Care | Payer: PPO | Source: Ambulatory Visit | Attending: Radiation Oncology | Admitting: Radiation Oncology

## 2020-09-24 DIAGNOSIS — Z510 Encounter for antineoplastic radiation therapy: Secondary | ICD-10-CM | POA: Diagnosis not present

## 2020-09-25 ENCOUNTER — Ambulatory Visit
Admission: RE | Admit: 2020-09-25 | Discharge: 2020-09-25 | Disposition: A | Discharge status: Home / Self Care | Payer: PPO | Source: Ambulatory Visit | Attending: Radiation Oncology | Admitting: Radiation Oncology

## 2020-09-25 DIAGNOSIS — C4440 Unspecified malignant neoplasm of skin of scalp and neck: Secondary | ICD-10-CM | POA: Insufficient documentation

## 2020-09-29 ENCOUNTER — Other Ambulatory Visit: Payer: Self-pay

## 2020-09-29 ENCOUNTER — Ambulatory Visit
Admission: RE | Admit: 2020-09-29 | Discharge: 2020-09-29 | Disposition: A | Discharge status: Home / Self Care | Payer: PPO | Source: Ambulatory Visit | Attending: Radiation Oncology | Admitting: Radiation Oncology

## 2020-09-29 DIAGNOSIS — C4440 Unspecified malignant neoplasm of skin of scalp and neck: Secondary | ICD-10-CM | POA: Diagnosis not present

## 2020-09-30 ENCOUNTER — Ambulatory Visit
Admission: RE | Admit: 2020-09-30 | Discharge: 2020-09-30 | Disposition: A | Discharge status: Home / Self Care | Payer: PPO | Source: Ambulatory Visit | Attending: Radiation Oncology | Admitting: Radiation Oncology

## 2020-09-30 DIAGNOSIS — C4440 Unspecified malignant neoplasm of skin of scalp and neck: Secondary | ICD-10-CM | POA: Diagnosis not present

## 2020-09-30 DIAGNOSIS — C490 Malignant neoplasm of connective and soft tissue of head, face and neck: Secondary | ICD-10-CM | POA: Diagnosis not present

## 2020-09-30 MED ORDER — SONAFINE EX EMUL
1.0000 "application " | Freq: Once | CUTANEOUS | Status: AC
  Administered 2020-09-30: 1 via TOPICAL

## 2020-09-30 NOTE — Progress Notes (Signed)
Pt here for patient teaching.    Pt given Radiation and You booklet, Managing Acute Radiation Side Effects for Head and Neck Cancer handout, skin care instructions, and Sonafine.    Reviewed areas of pertinence such as fatigue, hair loss, mouth changes, skin changes, throat changes, earaches, and taste changes .   Pt able to give teach back of to pat skin and use unscented/gentle soap,apply Sonafine bid, avoid applying anything to skin within 4 hours of treatment, and to use an electric razor if they must shave.   Pt verbalizes understanding of information given and will contact nursing with any questions or concerns.    [email or web address]

## 2020-10-01 ENCOUNTER — Other Ambulatory Visit: Payer: Self-pay

## 2020-10-01 ENCOUNTER — Ambulatory Visit
Admission: RE | Admit: 2020-10-01 | Discharge: 2020-10-01 | Disposition: A | Discharge status: Home / Self Care | Payer: PPO | Source: Ambulatory Visit | Attending: Radiation Oncology | Admitting: Radiation Oncology

## 2020-10-01 DIAGNOSIS — C4440 Unspecified malignant neoplasm of skin of scalp and neck: Secondary | ICD-10-CM | POA: Diagnosis not present

## 2020-10-02 ENCOUNTER — Ambulatory Visit
Admission: RE | Admit: 2020-10-02 | Discharge: 2020-10-02 | Disposition: A | Discharge status: Home / Self Care | Payer: PPO | Source: Ambulatory Visit | Attending: Radiation Oncology | Admitting: Radiation Oncology

## 2020-10-02 DIAGNOSIS — C4440 Unspecified malignant neoplasm of skin of scalp and neck: Secondary | ICD-10-CM | POA: Diagnosis not present

## 2020-10-05 ENCOUNTER — Other Ambulatory Visit: Payer: Self-pay

## 2020-10-05 ENCOUNTER — Ambulatory Visit
Admission: RE | Admit: 2020-10-05 | Discharge: 2020-10-05 | Disposition: A | Discharge status: Home / Self Care | Payer: PPO | Source: Ambulatory Visit | Attending: Radiation Oncology | Admitting: Radiation Oncology

## 2020-10-05 DIAGNOSIS — C4440 Unspecified malignant neoplasm of skin of scalp and neck: Secondary | ICD-10-CM | POA: Diagnosis not present

## 2020-10-06 ENCOUNTER — Ambulatory Visit
Admission: RE | Admit: 2020-10-06 | Discharge: 2020-10-06 | Disposition: A | Discharge status: Home / Self Care | Payer: PPO | Source: Ambulatory Visit | Attending: Radiation Oncology | Admitting: Radiation Oncology

## 2020-10-06 DIAGNOSIS — C4440 Unspecified malignant neoplasm of skin of scalp and neck: Secondary | ICD-10-CM | POA: Diagnosis not present

## 2020-10-07 ENCOUNTER — Other Ambulatory Visit: Payer: Self-pay

## 2020-10-07 ENCOUNTER — Ambulatory Visit
Admission: RE | Admit: 2020-10-07 | Discharge: 2020-10-07 | Disposition: A | Discharge status: Home / Self Care | Payer: PPO | Source: Ambulatory Visit | Attending: Radiation Oncology | Admitting: Radiation Oncology

## 2020-10-07 DIAGNOSIS — C490 Malignant neoplasm of connective and soft tissue of head, face and neck: Secondary | ICD-10-CM | POA: Diagnosis not present

## 2020-10-07 DIAGNOSIS — C4440 Unspecified malignant neoplasm of skin of scalp and neck: Secondary | ICD-10-CM | POA: Diagnosis not present

## 2020-10-08 ENCOUNTER — Ambulatory Visit
Admission: RE | Admit: 2020-10-08 | Discharge: 2020-10-08 | Disposition: A | Discharge status: Home / Self Care | Payer: PPO | Source: Ambulatory Visit | Attending: Radiation Oncology | Admitting: Radiation Oncology

## 2020-10-08 DIAGNOSIS — C4440 Unspecified malignant neoplasm of skin of scalp and neck: Secondary | ICD-10-CM | POA: Diagnosis not present

## 2020-10-09 ENCOUNTER — Other Ambulatory Visit: Payer: Self-pay

## 2020-10-09 ENCOUNTER — Ambulatory Visit
Admission: RE | Admit: 2020-10-09 | Discharge: 2020-10-09 | Disposition: A | Discharge status: Home / Self Care | Payer: PPO | Source: Ambulatory Visit | Attending: Radiation Oncology | Admitting: Radiation Oncology

## 2020-10-09 DIAGNOSIS — C4440 Unspecified malignant neoplasm of skin of scalp and neck: Secondary | ICD-10-CM | POA: Diagnosis not present

## 2020-10-12 ENCOUNTER — Other Ambulatory Visit: Payer: Self-pay

## 2020-10-12 ENCOUNTER — Ambulatory Visit
Admission: RE | Admit: 2020-10-12 | Discharge: 2020-10-12 | Disposition: A | Discharge status: Home / Self Care | Payer: PPO | Source: Ambulatory Visit | Attending: Radiation Oncology | Admitting: Radiation Oncology

## 2020-10-12 DIAGNOSIS — C4440 Unspecified malignant neoplasm of skin of scalp and neck: Secondary | ICD-10-CM | POA: Diagnosis not present

## 2020-10-13 ENCOUNTER — Ambulatory Visit
Admission: RE | Admit: 2020-10-13 | Discharge: 2020-10-13 | Disposition: A | Discharge status: Home / Self Care | Payer: PPO | Source: Ambulatory Visit | Attending: Radiation Oncology | Admitting: Radiation Oncology

## 2020-10-13 DIAGNOSIS — C4440 Unspecified malignant neoplasm of skin of scalp and neck: Secondary | ICD-10-CM | POA: Diagnosis not present

## 2020-10-14 ENCOUNTER — Other Ambulatory Visit: Payer: Self-pay

## 2020-10-14 ENCOUNTER — Ambulatory Visit
Admission: RE | Admit: 2020-10-14 | Discharge: 2020-10-14 | Disposition: A | Discharge status: Home / Self Care | Payer: PPO | Source: Ambulatory Visit | Attending: Radiation Oncology | Admitting: Radiation Oncology

## 2020-10-14 ENCOUNTER — Encounter: Payer: Self-pay | Admitting: Radiation Oncology

## 2020-10-14 DIAGNOSIS — C490 Malignant neoplasm of connective and soft tissue of head, face and neck: Secondary | ICD-10-CM | POA: Diagnosis not present

## 2020-10-14 DIAGNOSIS — C4440 Unspecified malignant neoplasm of skin of scalp and neck: Secondary | ICD-10-CM | POA: Diagnosis not present

## 2020-10-21 ENCOUNTER — Ambulatory Visit: Payer: PPO | Admitting: Internal Medicine

## 2020-10-22 NOTE — Progress Notes (Signed)
Subjective:    Patient ID: Hunter Chang, male    DOB: 1945-04-04, 75 y.o.   MRN: [id number]  HPI He is here to establish with a new pcp.   He is here for a physical exam.    He has no concerns.  He just completed radiation last week with a tumor on the top of his head.   Medications and allergies reviewed with patient and updated if appropriate.  Patient Active Problem List   Diagnosis Date Noted   COVID 08/26/2020   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    Current Outpatient Medications on File Prior to Visit  Medication Sig Dispense Refill   aspirin 81 MG EC tablet Take 81 mg by mouth daily. Swallow whole.     magnesium gluconate (MAGONATE) 500 MG tablet Take by mouth.     Multiple Vitamins-Minerals (ONE-A-DAY MENS 50+ ADVANTAGE) TABS Take 1 tablet by mouth daily.     No current facility-administered medications on file prior to visit.    Past Medical History:  Diagnosis Date   Arthritis    Chicken pox     Past Surgical History:  Procedure Laterality Date   HERNIA REPAIR     3 times    Social History   Socioeconomic History   Marital status: Married    Spouse name: Not on file   Number of children: Not on file   Years of education: Not on file   Highest education level: Not on file  Occupational History   Not on file  Tobacco Use   Smoking status: Never   Smokeless tobacco: Never  Vaping Use   Vaping Use: Never used  Substance and Sexual Activity   Alcohol use: No   Drug use: No   Sexual activity: Yes  Other Topics Concern   Not on file  Social History Narrative   Not on file   Social Determinants of Health   Financial Resource Strain: Not on file  Food Insecurity: Not on file  Transportation Needs: Not on file  Physical Activity: Not on file  Stress: Not on file  Social Connections: Not on file    Family History  Problem Relation Age of Onset   Arthritis Mother    Diabetes Father    Early death Father    Heart disease  Father    Heart attack Father    Early death Maternal Grandfather    COPD Paternal Grandfather     Review of Systems  Constitutional:  Negative for chills and fever.  Eyes:  Negative for visual disturbance.  Respiratory:  Negative for cough, shortness of breath and wheezing.   Cardiovascular:  Negative for chest pain, palpitations and leg swelling.  Gastrointestinal:  Negative for abdominal pain, blood in stool, constipation, diarrhea and nausea.       No gerd  Genitourinary:  Negative for difficulty urinating, dysuria and hematuria.  Musculoskeletal:  Negative for arthralgias and back pain.  Skin:  Negative for color change and rash.  Neurological:  Negative for dizziness, light-headedness and headaches.  Psychiatric/Behavioral:  Negative for dysphoric mood and sleep disturbance. The patient is not nervous/anxious.       Objective:   Vitals:   10/23/20 0958  BP: 122/82  Pulse: 76  Temp: 98.5 F (36.9 C)  SpO2: 96%   BP Readings from Last 3 Encounters:  10/23/20 122/82  01/11/19 130/78  04/26/18 134/80   Wt Readings from Last 3 Encounters:  10/23/20 177 lb (80.3 kg)  01/11/19 183 lb 6.4 oz (83.2 kg)  04/07/17 182 lb (82.6 kg)   Body mass index is 27.31 kg/m.  Depression screen [facility] 2/9 10/23/2020 01/11/2019 03/03/2017 12/30/2016  Decreased Interest 0 0 0 0  Down, Depressed, Hopeless 0 0 0 0  PHQ - 2 Score 0 0 0 0  Altered sleeping 0 - - -  Tired, decreased energy 1 - - -  Change in appetite 0 - - -  Feeling bad or failure about yourself  0 - - -  Trouble concentrating 0 - - -  Moving slowly or fidgety/restless 0 - - -  Suicidal thoughts 0 - - -  PHQ-9 Score 1 - - -  Difficult doing work/chores Not difficult at all - - -    GAD 7 : Generalized Anxiety Score 10/23/2020  Nervous, Anxious, on Edge 0  Control/stop worrying 0  Worry too much - different things 0  Trouble relaxing 0  Restless 0  Easily annoyed or irritable 1  Afraid - awful might happen 0  Total  GAD 7 Score 1  Anxiety Difficulty Not difficult at all        Physical Exam    Constitutional: He appears well-developed and well-nourished. No distress.  HENT:  Head: Normocephalic and atraumatic.  Right Ear: External ear normal.  Left Ear: External ear normal.  Mouth/Throat: Oropharynx is clear and moist.  Normal ear canals and TM b/l  Eyes: Conjunctivae and EOM are normal.  Neck: Neck supple. No tracheal deviation present. No thyromegaly present.  No carotid bruit  Cardiovascular: Normal rate, regular rhythm, normal heart sounds and intact distal pulses.   No murmur heard. Pulmonary/Chest: Effort normal and breath sounds normal. No respiratory distress. He has no wheezes. He has no rales.  Abdominal: Soft. He exhibits no distension. There is no tenderness.  Genitourinary: deferred  Musculoskeletal: He exhibits no edema.  Lymphadenopathy:   He has no cervical adenopathy.  Skin: Skin is warm and dry. He is not diaphoretic.  Psychiatric: He has a normal mood and affect. His behavior is normal.      Assessment & Plan:    Physical exam: Screening blood work Immunizations   had advised shingrix, discussed covid vaccines ( had 3) Colonoscopy Up to date  Exercise golf, tennis, Weight good for age Substance abuse  none  Screened for depression using the PHQ 9 scale.  No evidence of depression.   Screened for anxiety using GAD7 Scale.  No evidence of anxiety.    See Problem List for Assessment and Plan of chronic medical problems.    This visit occurred during the SARS-CoV-2 public health emergency.  Safety protocols were in place, including screening questions prior to the visit, additional usage of staff PPE, and extensive cleaning of exam room while observing appropriate contact time as indicated for disinfecting solutions.

## 2020-10-23 ENCOUNTER — Ambulatory Visit (INDEPENDENT_AMBULATORY_CARE_PROVIDER_SITE_OTHER): Payer: PPO | Admitting: Internal Medicine

## 2020-10-23 ENCOUNTER — Other Ambulatory Visit: Payer: Self-pay

## 2020-10-23 ENCOUNTER — Encounter: Payer: Self-pay | Admitting: Internal Medicine

## 2020-10-23 VITALS — BP 122/82 | HR 76 | Temp 98.5°F | Ht 67.5 in | Wt 177.0 lb

## 2020-10-23 DIAGNOSIS — D485 Neoplasm of uncertain behavior of skin: Secondary | ICD-10-CM

## 2020-10-23 DIAGNOSIS — Z1331 Encounter for screening for depression: Secondary | ICD-10-CM | POA: Diagnosis not present

## 2020-10-23 DIAGNOSIS — Z0000 Encounter for general adult medical examination without abnormal findings: Secondary | ICD-10-CM

## 2020-10-23 LAB — CBC WITH DIFFERENTIAL/PLATELET
Basophils Absolute: 0.1 10*3/uL (ref 0.0–0.1)
Basophils Relative: 0.9 % (ref 0.0–3.0)
Eosinophils Absolute: 0.1 10*3/uL (ref 0.0–0.7)
Eosinophils Relative: 2.4 % (ref 0.0–5.0)
HCT: 44.7 % (ref 39.0–52.0)
Hemoglobin: 15.3 g/dL (ref 13.0–17.0)
Lymphocytes Relative: 23 % (ref 12.0–46.0)
Lymphs Abs: 1.4 10*3/uL (ref 0.7–4.0)
MCHC: 34.1 g/dL (ref 30.0–36.0)
MCV: 89 fl (ref 78.0–100.0)
Monocytes Absolute: 0.7 10*3/uL (ref 0.1–1.0)
Monocytes Relative: 11.5 % (ref 3.0–12.0)
Neutro Abs: 3.7 10*3/uL (ref 1.4–7.7)
Neutrophils Relative %: 62.2 % (ref 43.0–77.0)
Platelets: 243 10*3/uL (ref 150.0–400.0)
RBC: 5.02 Mil/uL (ref 4.22–5.81)
RDW: 13.8 % (ref 11.5–15.5)
WBC: 5.9 10*3/uL (ref 4.0–10.5)

## 2020-10-23 LAB — TSH: TSH: 0.76 u[IU]/mL (ref 0.35–5.50)

## 2020-10-23 LAB — COMPREHENSIVE METABOLIC PANEL
ALT: 24 U/L (ref 0–53)
AST: 27 U/L (ref 0–37)
Albumin: 4.1 g/dL (ref 3.5–5.2)
Alkaline Phosphatase: 107 U/L (ref 39–117)
BUN: 17 mg/dL (ref 6–23)
CO2: 30 mEq/L (ref 19–32)
Calcium: 9.7 mg/dL (ref 8.4–10.5)
Chloride: 104 mEq/L (ref 96–112)
Creatinine, Ser: 1.09 mg/dL (ref 0.40–1.50)
GFR: 66.95 mL/min (ref >60.00)
Glucose, Bld: 92 mg/dL (ref 70–99)
Potassium: 4.6 mEq/L (ref 3.5–5.1)
Sodium: 141 mEq/L (ref 135–145)
Total Bilirubin: 0.7 mg/dL (ref 0.2–1.2)
Total Protein: 7.5 g/dL (ref 6.0–8.3)

## 2020-10-23 LAB — LIPID PANEL
Cholesterol: 180 mg/dL (ref 0–200)
HDL: 44.8 mg/dL (ref >39.00)
LDL Cholesterol: 117 mg/dL — ABNORMAL HIGH (ref 0–99)
NonHDL: 135.36
Total CHOL/HDL Ratio: 4
Triglycerides: 92 mg/dL (ref 0.0–149.0)
VLDL: 18.4 mg/dL (ref 0.0–40.0)

## 2020-10-23 NOTE — Patient Instructions (Addendum)
Blood work was ordered.     Medications changes include :   none     Please followup in 1 year    Health Maintenance, Male Adopting a healthy lifestyle and getting preventive care are important in promoting health and wellness. Ask your health care provider about: The right schedule for you to have regular tests and exams. Things you can do on your own to prevent diseases and keep yourself healthy. What should I know about diet, weight, and exercise? Eat a healthy diet  Eat a diet that includes plenty of vegetables, fruits, low-fat dairy products, and lean protein. Do not eat a lot of foods that are high in solid fats, added sugars, or sodium.  Maintain a healthy weight Body mass index (BMI) is a measurement that can be used to identify possible weight problems. It estimates body fat based on height and weight. Your health care provider can help determine your BMI and help you achieve or maintain ahealthy weight. Get regular exercise Get regular exercise. This is one of the most important things you can do for your health. Most adults should: Exercise for at least 150 minutes each week. The exercise should increase your heart rate and make you sweat (moderate-intensity exercise). Do strengthening exercises at least twice a week. This is in addition to the moderate-intensity exercise. Spend less time sitting. Even light physical activity can be beneficial. Watch cholesterol and blood lipids Have your blood tested for lipids and cholesterol at 74 years of age, then havethis test every 5 years. You may need to have your cholesterol levels checked more often if: Your lipid or cholesterol levels are high. You are older than 74 years of age. You are at high risk for heart disease. What should I know about cancer screening? Many types of cancers can be detected early and may often be prevented. Depending on your health history and family history, you may need to have cancer screening  at various ages. This may include screening for: Colorectal cancer. Prostate cancer. Skin cancer. Lung cancer. What should I know about heart disease, diabetes, and high blood pressure? Blood pressure and heart disease High blood pressure causes heart disease and increases the risk of stroke. This is more likely to develop in people who have high blood pressure readings, are of African descent, or are overweight. Talk with your health care provider about your target blood pressure readings. Have your blood pressure checked: Every 3-5 years if you are 96-31 years of age. Every year if you are 28 years old or older. If you are between the ages of 23 and 19 and are a current or former smoker, ask your health care provider if you should have a one-time screening for abdominal aortic aneurysm (AAA). Diabetes Have regular diabetes screenings. This checks your fasting blood sugar level. Have the screening done: Once every three years after age 56 if you are at a normal weight and have a low risk for diabetes. More often and at a younger age if you are overweight or have a high risk for diabetes. What should I know about preventing infection? Hepatitis B If you have a higher risk for hepatitis B, you should be screened for this virus. Talk with your health care provider to find out if you are at risk forhepatitis B infection. Hepatitis C Blood testing is recommended for: Everyone born from [birth year] through 1964. Anyone with known risk factors for hepatitis C. Sexually transmitted infections (STIs) You should be screened each year  for STIs, including gonorrhea and chlamydia, if: You are sexually active and are younger than 74 years of age. You are older than 74 years of age and your health care provider tells you that you are at risk for this type of infection. Your sexual activity has changed since you were last screened, and you are at increased risk for chlamydia or gonorrhea. Ask your health care  provider if you are at risk. Ask your health care provider about whether you are at high risk for HIV. Your health care provider may recommend a prescription medicine to help prevent HIV infection. If you choose to take medicine to prevent HIV, you should first get tested for HIV. You should then be tested every 3 months for as long as you are taking the medicine. Follow these instructions at home: Lifestyle Do not use any products that contain nicotine or tobacco, such as cigarettes, e-cigarettes, and chewing tobacco. If you need help quitting, ask your health care provider. Do not use street drugs. Do not share needles. Ask your health care provider for help if you need support or information about quitting drugs. Alcohol use Do not drink alcohol if your health care provider tells you not to drink. If you drink alcohol: Limit how much you have to 0-2 drinks a day. Be aware of how much alcohol is in your drink. In the [place] one drink equals one 12 oz bottle of beer (355 mL), one 5 oz glass of wine (148 mL), or one 1 oz glass of hard liquor (44 mL). General instructions Schedule regular health, dental, and eye exams. Stay current with your vaccines. Tell your health care provider if: You often feel depressed. You have ever been abused or do not feel safe at home. Summary Adopting a healthy lifestyle and getting preventive care are important in promoting health and wellness. Follow your health care provider's instructions about healthy diet, exercising, and getting tested or screened for diseases. Follow your health care provider's instructions on monitoring your cholesterol and blood pressure. This information is not intended to replace advice given to you by your health care provider. Make sure you discuss any questions you have with your healthcare provider. Document Revised: 03/07/2018 Document Reviewed: 03/07/2018 Elsevier Patient Education  2022 [employer].

## 2020-10-23 NOTE — Assessment & Plan Note (Signed)
Status postsurgery x2 and radiation x20 treatments Following with dermatology and radiation oncology Overall healing well and doing well

## 2020-11-06 ENCOUNTER — Ambulatory Visit
Admission: RE | Admit: 2020-11-06 | Discharge: 2020-11-06 | Disposition: A | Discharge status: Home / Self Care | Payer: PPO | Source: Ambulatory Visit | Attending: Radiation Oncology | Admitting: Radiation Oncology

## 2020-11-06 ENCOUNTER — Other Ambulatory Visit: Payer: Self-pay

## 2020-11-06 VITALS — BP 138/81 | HR 71 | Temp 97.8°F | Resp 18 | Wt 178.6 lb

## 2020-11-06 DIAGNOSIS — Z923 Personal history of irradiation: Secondary | ICD-10-CM | POA: Diagnosis not present

## 2020-11-06 DIAGNOSIS — D485 Neoplasm of uncertain behavior of skin: Secondary | ICD-10-CM

## 2020-11-06 DIAGNOSIS — Z7982 Long term (current) use of aspirin: Secondary | ICD-10-CM | POA: Insufficient documentation

## 2020-11-06 MED ORDER — SONAFINE EX EMUL
1.0000 "application " | Freq: Two times a day (BID) | CUTANEOUS | Status: DC
  Administered 2020-11-06: 1 via TOPICAL

## 2020-11-06 NOTE — Progress Notes (Signed)
Hunter Chang presents for follow-up after completing radiation to his scalp on 10/14/2020  Pain issues, if any: Patient denies Skin: Reports some peeling/itching to treatment field, but denies any pain or weeping from site Dermatology follow-up: Reports he is schedueld for F/U in October with Dr. Jarome Matin (also plans to see Dr. Francina Ames around that time as well) Other notable issues, if any: [name] denies any fatigue. Reports over all he feels well and is pleased with his progress so far  Wt Readings from Last 3 Encounters:  11/06/20 178 lb 9.6 oz (81 kg)  10/23/20 177 lb (80.3 kg)  01/11/19 183 lb 6.4 oz (83.2 kg)   Vitals:   11/06/20 0949  BP: 138/81  Pulse: 71  Resp: 18  Temp: 97.8 F (36.6 C)  SpO2: 98%

## 2020-11-07 ENCOUNTER — Encounter: Payer: Self-pay | Admitting: Radiation Oncology

## 2020-11-07 NOTE — Progress Notes (Signed)
  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  Hunter Chang presents for follow-up after completing radiation to his scalp on 10/14/2020  Pain issues, if any: Patient denies Skin: Reports some peeling/itching to treatment field, but denies any pain or weeping from site Dermatology follow-up: Reports he is schedueld for F/U in October with Dr. Jarome Matin (also plans to see Dr. Francina Ames around that time as well) Other notable issues, if any: [name] denies any fatigue. Reports over all he feels well and is pleased with his progress so far  Wt Readings from Last 3 Encounters:  11/06/20 178 lb 9.6 oz (81 kg)  10/23/20 177 lb (80.3 kg)  01/11/19 183 lb 6.4 oz (83.2 kg)   Vitals:   11/06/20 0949  BP: 138/81  Pulse: 71  Resp: 18  Temp: 97.8 F (36.6 C)  SpO2: 98%                                 ALLERGIES:  is allergic to succinylcholine chloride.  Meds: Current Outpatient Medications  Medication Sig Dispense Refill   aspirin 81 MG EC tablet Take 81 mg by mouth daily. Swallow whole.     magnesium gluconate (MAGONATE) 500 MG tablet Take by mouth.     Multiple Vitamins-Minerals (ONE-A-DAY MENS 50+ ADVANTAGE) TABS Take 1 tablet by mouth daily.     No current facility-administered medications for this encounter.    Physical Findings: The patient is in no acute distress. Patient is alert and oriented.  weight is 178 lb 9.6 oz (81 kg). His temperature is 97.8 F (36.6 C). His blood pressure is 138/81 and his pulse is 71. His  respiration is 18 and oxygen saturation is 98%. .     Continued healing of scalp, see photo below (patient applied sonafine to scalp before visit)    Lab Findings: Lab Results  Component Value Date   WBC 5.9 10/23/2020   HGB 15.3 10/23/2020   HCT 44.7 10/23/2020   MCV 89.0 10/23/2020   PLT 243.0 10/23/2020    Radiographic Findings: No results found.  Impression/Plan:  Healing well form RT. Continue applying sonafine to scalp.  Anticipate scabbing will flake off in the next few months.  Apply moisturizer to skin BID for two more months thereafter. Continue optimizing nutrition, avoiding sun to scalp, avoiding tobacco for best healing.  We will refer back to Dr. Nicolette Bang for surveillance and he will continue to see Dr. Ronnald Ramp. I will see him back PRN.  On date of service, in total, I spent 15 minutes on this encounter. Patient was seen in person.  _____________________________________   Eppie Gibson, MD

## 2020-11-25 NOTE — Progress Notes (Signed)
                                                                                                                                                       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   Plan: The patient will follow-up with radiation oncology in 23mo -----------------------------------  SEppie Gibson MD

## 2020-12-07 DIAGNOSIS — Z23 Encounter for immunization: Secondary | ICD-10-CM | POA: Diagnosis not present

## 2020-12-12 ENCOUNTER — Other Ambulatory Visit: Payer: Self-pay

## 2020-12-12 ENCOUNTER — Ambulatory Visit (INDEPENDENT_AMBULATORY_CARE_PROVIDER_SITE_OTHER): Payer: PPO

## 2020-12-12 DIAGNOSIS — Z0000 Encounter for general adult medical examination without abnormal findings: Secondary | ICD-10-CM | POA: Diagnosis not present

## 2020-12-12 NOTE — Progress Notes (Signed)
Subjective:   Hunter Chang is a 75 y.o. male who presents for Medicare Annual/Subsequent preventive examination.  I connected with  Hunter Chang on 12/12/20 by a Audio enabled telemedicine application and verified that I am speaking with the correct person using two identifiers.   I discussed the limitations of evaluation and management by telemedicine. The patient expressed understanding and agreed to proceed.   Location of Patient: Home Location of Provider: Office Persons Participating in Visit: [place]  Review of Systems    Defer to PCP       Objective:    There were no vitals filed for this visit. There is no height or weight on file to calculate BMI.  Advanced Directives 12/12/2020 11/06/2020 09/02/2020 03/03/2017  Does Patient Have a Medical Advance Directive? Yes No Yes No  Type of Advance Directive Living will - [place];Living will -  Does patient want to make changes to medical advance directive? Yes (Inpatient - patient defers changing a medical advance directive at this time - Information given) - No - Patient declined -  Copy of [place] in Chart? - - No - copy requested -  Would patient like information on creating a medical advance directive? - No - Patient declined - No - Patient declined    Current Medications (verified) Outpatient Encounter Medications as of 12/12/2020  Medication Sig   aspirin 81 MG EC tablet Take 81 mg by mouth daily. Swallow whole.   Multiple Vitamins-Minerals (ONE-A-DAY MENS 50+ ADVANTAGE) TABS Take 1 tablet by mouth daily.   magnesium gluconate (MAGONATE) 500 MG tablet Take by mouth.   No facility-administered encounter medications on file as of 12/12/2020.    Allergies (verified) Succinylcholine chloride   History: Past Medical History:  Diagnosis Date   Arthritis    Chicken pox    Past Surgical History:  Procedure Laterality Date   HERNIA REPAIR     3 times   Family  History  Problem Relation Age of Onset   Arthritis Mother    Diabetes Father    Early death Father    Heart disease Father    Heart attack Father    Early death Maternal Grandfather    COPD Paternal Grandfather    Social History   Socioeconomic History   Marital status: Married    Spouse name: Not on file   Number of children: Not on file   Years of education: Not on file   Highest education level: Not on file  Occupational History   Not on file  Tobacco Use   Smoking status: Never   Smokeless tobacco: Never  Vaping Use   Vaping Use: Never used  Substance and Sexual Activity   Alcohol use: No   Drug use: No   Sexual activity: Yes  Other Topics Concern   Not on file  Social History Narrative   Not on file   Social Determinants of Health   Financial Resource Strain: Low Risk    Difficulty of Paying Living Expenses: Not hard at all  Food Insecurity: No Food Insecurity   Worried About Charity fundraiser in the Last Year: Never true   [place] in the Last Year: Never true  Transportation Needs: No Transportation Needs   Lack of Transportation (Medical): No   Lack of Transportation (Non-Medical): No  Physical Activity: Sufficiently Active   Days of Exercise per Week: 5 days   Minutes of Exercise  per Session: 50 min  Stress: No Stress Concern Present   Feeling of Stress : Not at all  Social Connections: Socially Integrated   Frequency of Communication with Friends and Family: More than three times a week   Frequency of Social Gatherings with Friends and Family: More than three times a week   Attends Religious Services: More than 4 times per year   Active Member of [employer] or Organizations: Yes   Attends Music therapist: More than 4 times per year   Marital Status: Married    Tobacco Counseling Counseling given: Not Answered   Clinical Intake:  Pre-visit preparation completed: Yes  Pain : No/denies pain     Diabetes: No  How often do  you need to have someone help you when you read instructions, pamphlets, or other written materials from your doctor or pharmacy?: 1 - Never What is the last grade level you completed in school?: College Degree  Diabetic?No  Interpreter Needed?: No      Activities of Daily Living In your present state of health, do you have any difficulty performing the following activities: 12/12/2020 10/23/2020  Hearing? N N  Vision? N N  Difficulty concentrating or making decisions? N N  Walking or climbing stairs? N N  Dressing or bathing? N N  Doing errands, shopping? N N  Preparing Food and eating ? N -  Using the Toilet? N -  In the past six months, have you accidently leaked urine? N -  Do you have problems with loss of bowel control? N -  Managing your Medications? N -  Managing your Finances? N -  Housekeeping or managing your Housekeeping? N -  Some recent data might be hidden    Patient Care Team: Binnie Rail, MD as PCP - General (Internal Medicine) Dermatology, [facility], Stephani Police, RN as Oncology Nurse Navigator Eppie Gibson, MD as Consulting Physician (Radiation Oncology)  Indicate any recent Medical Services you may have received from other than [facility] providers in the past year (date may be approximate).     Assessment:   This is a routine wellness examination for [name].  Hearing/Vision screen No results found.  Dietary issues and exercise activities discussed: Current Exercise Habits: Home exercise routine, Type of exercise: walking, Time (Minutes): 45, Frequency (Times/Week): 5, Weekly Exercise (Minutes/Week): 225, Intensity: Moderate   Goals Addressed   None    Depression Screen PHQ 2/9 Scores 12/12/2020 10/23/2020 01/11/2019 03/03/2017 12/30/2016  PHQ - 2 Score 0 0 0 0 0  PHQ- 9 Score - 1 - - -    Fall Risk Fall Risk  12/12/2020 10/23/2020 01/11/2019 10/24/2018 03/03/2017  Falls in the past year? 0 0 0 0 No  Comment - - - Emmi Telephone Survey: data to  providers prior to load -  Number falls in past yr: 0 0 - - -  Injury with Fall? 0 0 - - -  Risk for fall due to : - No Fall Risks - - -  Follow up Falls evaluation completed Falls evaluation completed - - -    FALL RISK PREVENTION PERTAINING TO THE HOME:  Any stairs in or around the home? No  If so, are there any without handrails? No  Home free of loose throw rugs in walkways, pet beds, electrical cords, etc? No  Adequate lighting in your home to reduce risk of falls? Yes   ASSISTIVE DEVICES UTILIZED TO PREVENT FALLS:  Life alert? No  Use of a cane,  walker or w/c? No  Grab bars in the bathroom? No  Shower chair or bench in shower? No  Elevated toilet seat or a handicapped toilet? No   TIMED UP AND GO:  Was the test performed? No .  Length of time to ambulate 10 feet: N/A sec.   Gait steady and fast without use of assistive device  Cognitive Function: MMSE - Mini Mental State Exam 03/03/2017  Orientation to time 5  Orientation to Place 5  Registration 3  Attention/ Calculation 5  Recall 3  Language- name 2 objects 2  Language- repeat 1  Language- follow 3 step command 3  Language- read & follow direction 1  Write a sentence 1  Copy design 1  Total score 30     6CIT Screen 12/12/2020  What Year? 0 points  What month? 0 points  What time? 0 points  Count back from 20 0 points  Months in reverse 0 points  Repeat phrase 0 points  Total Score 0    Immunizations Immunization History  Administered Date(s) Administered   Fluad Quad(high Dose 65+) 12/01/2018   Influenza, High Dose Seasonal PF 12/03/2017   Influenza-Unspecified 11/26/2016, 12/30/2017   Pneumococcal Conjugate-13 12/30/2016   Pneumococcal Polysaccharide-23 12/30/2017   Tdap 06/05/2012   Zoster Recombinat (Shingrix) 12/01/2018    TDAP status: Up to date  Flu Vaccine status: Up to date  Pneumococcal vaccine status: Up to date  Covid-19 vaccine status: Completed vaccines  Qualifies for  Shingles Vaccine? Yes   Zostavax completed Yes   Shingrix Completed?: Yes  Screening Tests Health Maintenance  Topic Date Due   COVID-19 Vaccine (1) Never done   Hepatitis C Screening  Never done   Zoster Vaccines- Shingrix (2 of 2) 01/26/2019   COLONOSCOPY (Pts 45-65yr Insurance coverage will need to be confirmed)  02/25/2022   TETANUS/TDAP  06/06/2022   INFLUENZA VACCINE  Completed   HPV VACCINES  Aged Out    Health Maintenance  Health Maintenance Due  Topic Date Due   COVID-19 Vaccine (1) Never done   Hepatitis C Screening  Never done   Zoster Vaccines- Shingrix (2 of 2) 01/26/2019    Colorectal cancer screening: Type of screening: Colonoscopy. Completed 02/26/2012. Repeat every 10 years  Lung Cancer Screening: (Low Dose CT Chest recommended if Age 74-80years, 30 pack-year currently smoking OR have quit w/in 15years.) does qualify.   Lung Cancer Screening Referral: n/a  Additional Screening:  Hepatitis C Screening: does qualify; Completed n/a  Vision Screening: Recommended annual ophthalmology exams for early detection of glaucoma and other disorders of the eye. Is the patient up to date with their annual eye exam?  Yes  Who is the provider or what is the name of the office in which the patient attends annual eye exams? Yes If pt is not established with a provider, would they like to be referred to a provider to establish care? No .   Dental Screening: Recommended annual dental exams for proper oral hygiene  Community Resource Referral / Chronic Care Management: CRR required this visit?  No   CCM required this visit?  No      Plan:     I have personally reviewed and noted the following in the patient's chart:   Medical and social history Use of alcohol, tobacco or illicit drugs  Current medications and supplements including opioid prescriptions. Patient is not currently taking opioid prescriptions. Functional ability and status Nutritional  status Physical activity Advanced directives List of  other physicians Hospitalizations, surgeries, and ER visits in previous 12 months Vitals Screenings to include cognitive, depression, and falls Referrals and appointments  In addition, I have reviewed and discussed with patient certain preventive protocols, quality metrics, and best practice recommendations. A written personalized care plan for preventive services as well as general preventive health recommendations were provided to patient.     Venetia Maxon, [facility]   12/12/2020   Nurse Notes: Non Face to Face 30 mins  Mr. Dilday , Thank you for taking time to come for your Medicare Wellness Visit. I appreciate your ongoing commitment to your health goals. Please review the following plan we discussed and let me know if I can assist you in the future.   These are the goals we discussed:  Goals      Patient Stated     Maintain current health by staying active.         This is a list of the screening recommended for you and due dates:  Health Maintenance  Topic Date Due   COVID-19 Vaccine (1) Never done   Hepatitis C Screening: USPSTF Recommendation to screen - Ages 25-79 yo.  Never done   Zoster (Shingles) Vaccine (2 of 2) 01/26/2019   Colon Cancer Screening  02/25/2022   Tetanus Vaccine  06/06/2022   Flu Shot  Completed   HPV Vaccine  Aged Out

## 2021-01-15 DIAGNOSIS — H25013 Cortical age-related cataract, bilateral: Secondary | ICD-10-CM | POA: Diagnosis not present

## 2021-01-15 DIAGNOSIS — H40053 Ocular hypertension, bilateral: Secondary | ICD-10-CM | POA: Diagnosis not present

## 2021-01-26 DIAGNOSIS — L570 Actinic keratosis: Secondary | ICD-10-CM | POA: Diagnosis not present

## 2021-01-26 DIAGNOSIS — Z85828 Personal history of other malignant neoplasm of skin: Secondary | ICD-10-CM | POA: Diagnosis not present

## 2021-02-10 DIAGNOSIS — Z9889 Other specified postprocedural states: Secondary | ICD-10-CM | POA: Diagnosis not present

## 2021-02-10 DIAGNOSIS — D485 Neoplasm of uncertain behavior of skin: Secondary | ICD-10-CM | POA: Diagnosis not present

## 2021-02-10 DIAGNOSIS — Z923 Personal history of irradiation: Secondary | ICD-10-CM | POA: Diagnosis not present

## 2021-03-31 DIAGNOSIS — D481 Neoplasm of uncertain behavior of connective and other soft tissue: Secondary | ICD-10-CM | POA: Diagnosis not present

## 2021-03-31 DIAGNOSIS — D485 Neoplasm of uncertain behavior of skin: Secondary | ICD-10-CM | POA: Diagnosis not present

## 2021-03-31 DIAGNOSIS — Z923 Personal history of irradiation: Secondary | ICD-10-CM | POA: Diagnosis not present

## 2021-04-01 ENCOUNTER — Telehealth: Payer: Self-pay | Admitting: Internal Medicine

## 2021-04-01 ENCOUNTER — Telehealth: Payer: Self-pay

## 2021-04-01 DIAGNOSIS — R519 Headache, unspecified: Secondary | ICD-10-CM

## 2021-04-01 NOTE — Telephone Encounter (Signed)
Received VM yesterday evening from patient's wife expressing concern about on-going symptoms since patient completed radiation to his scalp last July. She reports he has been experiencing mild headaches over his right eye, as well as noticeable memory loss. Wife wanted to know if these are expected long-term side effects from his radiation. Requested return call with update  Relayed wife's concerns to Dr. Isidore Moos who advised that patient's symptoms are not expected side effects from his treatment. She recommended referring patient to neuro-oncologist Dr. Mickeal Skinner for evaluation.   Returned wife's call this morning and spoke to both her and the patient. Informed them of Dr. Pearlie Oyster response and recommendations. Both verbalized understanding and appreciation of call, and patient gave consent for referral to Dr. Mickeal Skinner. No other needs expressed at this time, but patient and wife have my direct number should they have any future questions/concerns

## 2021-04-01 NOTE — Telephone Encounter (Signed)
Scheduled appt per 1/5 referral. Pt is aware of appt date and time.  °

## 2021-04-19 ENCOUNTER — Other Ambulatory Visit: Payer: Self-pay

## 2021-04-19 ENCOUNTER — Inpatient Hospital Stay: Payer: PPO | Attending: Internal Medicine | Admitting: Internal Medicine

## 2021-04-19 ENCOUNTER — Encounter: Payer: Self-pay | Admitting: Internal Medicine

## 2021-04-19 DIAGNOSIS — Z8261 Family history of arthritis: Secondary | ICD-10-CM | POA: Diagnosis not present

## 2021-04-19 DIAGNOSIS — R29818 Other symptoms and signs involving the nervous system: Secondary | ICD-10-CM | POA: Insufficient documentation

## 2021-04-19 DIAGNOSIS — D219 Benign neoplasm of connective and other soft tissue, unspecified: Secondary | ICD-10-CM | POA: Insufficient documentation

## 2021-04-19 DIAGNOSIS — G509 Disorder of trigeminal nerve, unspecified: Secondary | ICD-10-CM | POA: Insufficient documentation

## 2021-04-19 DIAGNOSIS — Z79899 Other long term (current) drug therapy: Secondary | ICD-10-CM | POA: Diagnosis not present

## 2021-04-19 DIAGNOSIS — Z833 Family history of diabetes mellitus: Secondary | ICD-10-CM | POA: Insufficient documentation

## 2021-04-19 DIAGNOSIS — Z836 Family history of other diseases of the respiratory system: Secondary | ICD-10-CM | POA: Diagnosis not present

## 2021-04-19 DIAGNOSIS — R229 Localized swelling, mass and lump, unspecified: Secondary | ICD-10-CM | POA: Diagnosis not present

## 2021-04-19 DIAGNOSIS — Z8249 Family history of ischemic heart disease and other diseases of the circulatory system: Secondary | ICD-10-CM | POA: Insufficient documentation

## 2021-04-19 DIAGNOSIS — G629 Polyneuropathy, unspecified: Secondary | ICD-10-CM | POA: Insufficient documentation

## 2021-04-19 NOTE — Progress Notes (Signed)
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    Referring Provider: Binnie Rail, MD [place],  [place] [id number]  Primary Cancer: Skin fibroxanthoma  History of Present Illness: The patient's records from the referring physician were obtained and reviewed and the patient interviewed to confirm this HPI.  [name] presents to review recent neurologic complaint.  He describes sharp, intense pain in the right side of his forehead and right cheek area.  Symptoms are brief and self limited, less than a minute, and they occur ~3-4x per week only.  They began after surgical removal of skin mass, fibroxanthoma, with bony invasion.  He thinks the syndrome began prior to radiation treatments.  Overall it is not getting worse or better, and interferes minimally with overall quality of life.  He has no other neurologic complaints, no history of headache or facial pain.    Medications: Current Outpatient Medications on File Prior to Visit  Medication Sig Dispense Refill   aspirin 81 MG EC tablet Take 81 mg by mouth daily. Swallow whole.     magnesium gluconate (MAGONATE) 500 MG tablet Take by mouth.     Multiple Vitamins-Minerals (ONE-A-DAY MENS 50+ ADVANTAGE) TABS Take 1 tablet by mouth daily.     Multiple Vitamins-Minerals (ZINC PO) Take 50 mg by mouth daily.     VITAMIN D PO Take 1 tablet by mouth daily.     No current facility-administered medications on file prior to visit.    Allergies:  Allergies  Allergen Reactions   Succinylcholine Chloride Other (See Comments)     "Paralyzed my diaphragm"   Past Medical History:  Past Medical History:  Diagnosis Date   Arthritis    Chicken pox    Past Surgical History:  Past Surgical History:  Procedure Laterality Date   HERNIA REPAIR     3 times   Social History:  Social History   Socioeconomic History   Marital status: Married    Spouse name: Not on file   Number of children: Not on file   Years of education: Not on file   Highest education level: Not on file  Occupational History   Not on file  Tobacco Use   Smoking status: Never   Smokeless tobacco: Never  Vaping Use   Vaping Use: Never used  Substance and Sexual Activity   Alcohol use: No   Drug use: No   Sexual activity: Yes  Other Topics Concern   Not on file  Social History Narrative   Not on file   Social Determinants of Health   Financial Resource Strain: Low Risk    Difficulty of Paying Living Expenses: Not hard at all  Food Insecurity: No Food Insecurity   Worried About Charity fundraiser in the Last Year: Never true   [place] in the Last Year: Never true  Transportation Needs: No Transportation Needs   Lack of Transportation (Medical): No   Lack of Transportation (Non-Medical): No  Physical Activity: Sufficiently Active   Days of Exercise per Week: 5 days   Minutes of Exercise per Session: 50 min  Stress:  No Stress Concern Present   Feeling of Stress : Not at all  Social Connections: Socially Integrated   Frequency of Communication with Friends and Family: More than three times a week   Frequency of Social Gatherings with Friends and Family: More than three times a week   Attends Religious Services: More than 4 times per year   Active Member of [employer] or Organizations: Yes   Attends Music therapist: More than 4 times per year   Marital Status: Married  Human resources officer Violence: Not At Risk   Fear of Current or Ex-Partner: No   Emotionally Abused: No   Physically Abused: No   Sexually Abused:  No   Family History:  Family History  Problem Relation Age of Onset   Arthritis Mother    Diabetes Father    Early death Father    Heart disease Father    Heart attack Father    Early death Maternal Grandfather    COPD Paternal Grandfather     Review of Systems: Constitutional: Doesn't report fevers, chills or abnormal weight loss Eyes: Doesn't report blurriness of vision Ears, nose, mouth, throat, and face: Doesn't report sore throat Respiratory: Doesn't report cough, dyspnea or wheezes Cardiovascular: Doesn't report palpitation, chest discomfort  Gastrointestinal:  Doesn't report nausea, constipation, diarrhea GU: Doesn't report incontinence Skin: Doesn't report skin rashes Neurological: Per HPI Musculoskeletal: Doesn't report joint pain Behavioral/Psych: Doesn't report anxiety  Physical Exam: Vitals:   04/19/21 1100  BP: 140/87  Pulse: 84  Resp: 18  Temp: 97.9 F (36.6 C)  SpO2: 98%   KPS: 90. General: Alert, cooperative, pleasant, in no acute distress Head: Normal EENT: No conjunctival injection or scleral icterus.  Lungs: Resp effort normal Cardiac: Regular rate Abdomen: Non-distended abdomen Skin: Scalp lesion noted. Extremities: No clubbing or edema  Neurologic Exam: Mental Status: Awake, alert, attentive to examiner. Oriented to self and environment. Language is fluent with intact comprehension.  Cranial Nerves: Visual acuity is grossly normal. Visual fields are full. Extra-ocular movements intact. No ptosis. Face is symmetric Motor: Tone and bulk are normal. Power is full in both arms and legs. Reflexes are symmetric, no pathologic reflexes present.  Sensory: Intact to light touch Gait: Normal.   Labs: I have reviewed the data as listed    Component Value Date/Time   NA 141 10/23/2020 1104   K 4.6 10/23/2020 1104   CL 104 10/23/2020 1104   CO2 30 10/23/2020 1104   GLUCOSE 92 10/23/2020 1104   BUN 17 10/23/2020 1104   CREATININE 1.09 10/23/2020  1104   CALCIUM 9.7 10/23/2020 1104   PROT 7.5 10/23/2020 1104   ALBUMIN 4.1 10/23/2020 1104   AST 27 10/23/2020 1104   ALT 24 10/23/2020 1104   ALKPHOS 107 10/23/2020 1104   BILITOT 0.7 10/23/2020 1104   Lab Results  Component Value Date   WBC 5.9 10/23/2020   NEUTROABS 3.7 10/23/2020   HGB 15.3 10/23/2020   HCT 44.7 10/23/2020   MCV 89.0 10/23/2020   PLT 243.0 10/23/2020     Assessment/Plan Trigeminal neuropathy  Andreus Cure presents with clinical syndrome localizing to right trigeminal nerve, V1 and V2 branches.  Semiology is most consistent with trigeminal neuralgia, although that more commonly involves V2 and V3.  No other localizing signs that arise suspicion of CNS process.   We did discuss role of medications like Tegretol in treating TN.  He will defer at this time because of minimal symptom burden.  We spent twenty  additional minutes teaching regarding the natural history, biology, and historical experience in the treatment of neurologic complications of cancer.   We appreciate the opportunity to participate in the care of [employer].  He will follow up with [place] as needed, in particular if symptoms worsen or change/migrate.    All questions were answered. The patient knows to call the clinic with any problems, questions or concerns. No barriers to learning were detected.  The total time spent in the encounter was 40 minutes and more than 50% was on counseling and review of test results   [name], MD Medical Director of Neuro-Oncology [facility] [place] 04/19/21 12:38 PM

## 2021-04-27 NOTE — Progress Notes (Signed)
Subjective:    Patient ID: Hunter Chang, male    DOB: [birth year], 76 y.o.   MRN: [id number]  This visit occurred during the SARS-CoV-2 public health emergency.  Safety protocols were in place, including screening questions prior to the visit, additional usage of staff PPE, and extensive cleaning of exam room while observing appropriate contact time as indicated for disinfecting solutions.    HPI The patient is here for an acute visit.  Within the past year he has been experiencing erectile dysfunction, which is new for him.  He is not sure if it is related to stress or the fact that he was put to sleep 3 times last year for medical procedures.  He was wondering what treatment options are work.    Medications and allergies reviewed with patient and updated if appropriate.  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 Swallow whole.     magnesium gluconate (MAGONATE) 500 MG tablet Take by mouth.     Multiple Vitamins-Minerals (ONE-A-DAY MENS 50+ ADVANTAGE) TABS Take 1 tablet by mouth daily.     Multiple Vitamins-Minerals (ZINC PO) Take 50 mg by mouth daily.     VITAMIN D PO Take 1 tablet by mouth daily.     No current facility-administered medications on file prior to visit.    Past Medical History:  Diagnosis Date   Arthritis    Chicken pox     Past Surgical History:  Procedure Laterality Date   HERNIA REPAIR     3 times    Social History   Socioeconomic History   Marital status: Married    Spouse name: Not on file   Number of children: Not on file   Years of education: Not on file   Highest education level: Not on file  Occupational History   Not on file  Tobacco Use   Smoking status: Never   Smokeless  tobacco: Never  Vaping Use   Vaping Use: Never used  Substance and Sexual Activity   Alcohol use: No   Drug use: No   Sexual activity: Yes  Other Topics Concern   Not on file  Social History Narrative   Not on file   Social Determinants of Health   Financial Resource Strain: Low Risk    Difficulty of Paying Living Expenses: Not hard at all  Food Insecurity: No Food Insecurity   Worried About Charity fundraiser in the Last Year: Never true   [place] in the Last Year: Never true  Transportation Needs: No Transportation Needs   Lack of Transportation (Medical): No   Lack of Transportation (Non-Medical): No  Physical Activity: Sufficiently Active   Days of Exercise per Week: 5 days   Minutes of Exercise per Session: 50 min  Stress: No Stress Concern Present   Feeling of Stress : Not at all  Social Connections: Socially Integrated   Frequency of Communication with Friends and Family: More than three times a week   Frequency of Social Gatherings with Friends and Family: More than three times a week   Attends Religious Services: More than 4 times per year   Active Member of [employer] or Organizations: Yes   Attends [employer]  or Organization Meetings: More than 4 times per year   Marital Status: Married    Family History  Problem Relation Age of Onset   Arthritis Mother    Diabetes Father    Early death Father    Heart disease Father    Heart attack Father    Early death Maternal Grandfather    COPD Paternal Grandfather     Review of Systems     Objective:   Vitals:   04/28/21 0920  BP: 140/78  Pulse: 86  Temp: 98.1 F (36.7 C)  SpO2: 94%   BP Readings from Last 3 Encounters:  04/28/21 140/78  04/19/21 140/87  11/06/20 138/81   Wt Readings from Last 3 Encounters:  04/28/21 183 lb (83 kg)  04/19/21 184 lb 9.6 oz (83.7 kg)  11/06/20 178 lb 9.6 oz (81 kg)   Body mass index is 28.66 kg/m.   Physical Exam         Assessment & Plan:    See Problem  List for Assessment and Plan of chronic medical problems.

## 2021-04-28 ENCOUNTER — Encounter: Payer: Self-pay | Admitting: Internal Medicine

## 2021-04-28 ENCOUNTER — Other Ambulatory Visit: Payer: Self-pay

## 2021-04-28 ENCOUNTER — Ambulatory Visit (INDEPENDENT_AMBULATORY_CARE_PROVIDER_SITE_OTHER): Payer: PPO | Admitting: Internal Medicine

## 2021-04-28 DIAGNOSIS — N529 Male erectile dysfunction, unspecified: Secondary | ICD-10-CM

## 2021-04-28 MED ORDER — SILDENAFIL CITRATE 100 MG PO TABS: 50.0000 mg | ORAL_TABLET | Freq: Every day | ORAL | 8 refills | Status: DC | PRN

## 2021-04-28 NOTE — Assessment & Plan Note (Signed)
Acute Over the past year he has been experiencing some erectile dysfunction Not currently on any prescription medications and no other concerning symptoms that requires further evaluation Trial of sildenafil 50-100 mg as needed.  Advised to take 1 hour prior to intercourse.  Discussed possible side effects If this is not effective or any side effects he will let me know-can try Cialis

## 2021-04-28 NOTE — Patient Instructions (Addendum)
° ° ° °  Medications changes include :   viagra 50 -100 mg  Your prescription(s) have been submitted to your pharmacy. Please take as directed and contact our office if you believe you are having problem(s) with the medication(s).

## 2021-05-26 DIAGNOSIS — D485 Neoplasm of uncertain behavior of skin: Secondary | ICD-10-CM | POA: Diagnosis not present

## 2021-07-01 ENCOUNTER — Encounter: Payer: Self-pay | Admitting: Internal Medicine

## 2021-07-02 ENCOUNTER — Other Ambulatory Visit: Payer: Self-pay | Admitting: Internal Medicine

## 2021-07-02 MED ORDER — CARBAMAZEPINE ER 100 MG PO TB12: 100.0000 mg | ORAL_TABLET | Freq: Two times a day (BID) | ORAL | 2 refills | Status: DC

## 2021-07-05 ENCOUNTER — Telehealth: Payer: Self-pay | Admitting: Internal Medicine

## 2021-07-05 NOTE — Telephone Encounter (Signed)
Per 4/10 inbasket left message with patient about appointment . Left call back number if changes are needed.  ?

## 2021-07-15 DIAGNOSIS — H40053 Ocular hypertension, bilateral: Secondary | ICD-10-CM | POA: Diagnosis not present

## 2021-07-26 ENCOUNTER — Inpatient Hospital Stay: Payer: PPO | Attending: Internal Medicine | Admitting: Internal Medicine

## 2021-07-26 DIAGNOSIS — G509 Disorder of trigeminal nerve, unspecified: Secondary | ICD-10-CM | POA: Diagnosis not present

## 2021-07-26 MED ORDER — CARBAMAZEPINE ER 100 MG PO TB12: 100.0000 mg | ORAL_TABLET | Freq: Two times a day (BID) | ORAL | 2 refills | Status: DC

## 2021-07-26 NOTE — Progress Notes (Signed)
I connected with Hunter Chang on 07/26/21 at 10:30 AM EDT by telephone visit and verified that I am speaking with the correct person using two identifiers.  ?I discussed the limitations, risks, security and privacy concerns of performing an evaluation and management service by telemedicine and the availability of in-person appointments. I also discussed with the patient that there may be a patient responsible charge related to this service. The patient expressed understanding and agreed to proceed.  ?Other persons participating in the visit and their role in the encounter:  n/a  ?Patient's location:  Home  ?Provider's location:  Office  ?Chief Complaint:  Trigeminal neuropathy ? ?History of Present Ilness: Hunter Chang describes significant improvement in facial pain syndrome over past few days.  Prior to this week, however, it had worsened with regards to frequency.  Episodes were occurring daily, multiple times per day.  Yesterday was the first completely pain free day in some time.  No issues tolerating the Tegretol, which he is taking '100mg'$  twice per day. ?Observations: Language and cognition at baseline ?Assessment and Plan: Trigeminal neuropathy ? ?Clinically improved on Tegretol '100mg'$  BID.  He would prefer to stay on this dose for now.  If needed, we can increase to '200mg'$  BID or 100/200.   ? ?He will obtain BMP in 2-3 months with his PCP to check Na+ level, given risk of hyponatremia on Tegretol. ? ?Follow Up Instructions: RTC in 3-4 months ? ?I discussed the assessment and treatment plan with the patient.  The patient was provided an opportunity to ask questions and all were answered.  The patient agreed with the plan and demonstrated understanding of the instructions.   ? ?The patient was advised to call back or seek an in-person evaluation if the symptoms worsen or if the condition fails to improve as anticipated.  I provided 5-10 minutes of non-face-to-face time during this enocunter. ? ?[name],  MD ? ? ?I provided 15 minutes of non face-to-face telephone visit time during this encounter, and > 50% was spent counseling as documented under my assessment & plan.  ? ?

## 2021-07-27 ENCOUNTER — Telehealth: Payer: Self-pay | Admitting: Internal Medicine

## 2021-07-27 NOTE — Telephone Encounter (Signed)
Scheduled per 5/2 los, message has been left with pt ?

## 2021-08-19 DIAGNOSIS — L821 Other seborrheic keratosis: Secondary | ICD-10-CM | POA: Diagnosis not present

## 2021-08-19 DIAGNOSIS — D2271 Melanocytic nevi of right lower limb, including hip: Secondary | ICD-10-CM | POA: Diagnosis not present

## 2021-08-19 DIAGNOSIS — Z85828 Personal history of other malignant neoplasm of skin: Secondary | ICD-10-CM | POA: Diagnosis not present

## 2021-08-19 DIAGNOSIS — D1801 Hemangioma of skin and subcutaneous tissue: Secondary | ICD-10-CM | POA: Diagnosis not present

## 2021-08-19 DIAGNOSIS — L812 Freckles: Secondary | ICD-10-CM | POA: Diagnosis not present

## 2021-08-19 DIAGNOSIS — L570 Actinic keratosis: Secondary | ICD-10-CM | POA: Diagnosis not present

## 2021-08-19 DIAGNOSIS — D2272 Melanocytic nevi of left lower limb, including hip: Secondary | ICD-10-CM | POA: Diagnosis not present

## 2021-08-26 ENCOUNTER — Encounter: Payer: Self-pay | Admitting: Internal Medicine

## 2021-08-30 ENCOUNTER — Encounter: Payer: Self-pay | Admitting: Internal Medicine

## 2021-09-02 ENCOUNTER — Other Ambulatory Visit: Payer: Self-pay | Admitting: Internal Medicine

## 2021-09-02 ENCOUNTER — Encounter: Payer: Self-pay | Admitting: Internal Medicine

## 2021-09-02 DIAGNOSIS — G509 Disorder of trigeminal nerve, unspecified: Secondary | ICD-10-CM

## 2021-09-02 MED ORDER — CARBAMAZEPINE ER 200 MG PO TB12: 200.0000 mg | ORAL_TABLET | Freq: Two times a day (BID) | ORAL | 1 refills | Status: DC

## 2021-09-06 ENCOUNTER — Other Ambulatory Visit: Payer: Self-pay | Admitting: Radiation Therapy

## 2021-09-07 ENCOUNTER — Telehealth: Payer: Self-pay | Admitting: Internal Medicine

## 2021-09-07 NOTE — Telephone Encounter (Signed)
.  Called pt per 6/12 inbasket , Patient was unavailable, a message with appt time and date was left with number on file.

## 2021-09-10 ENCOUNTER — Other Ambulatory Visit: Payer: Self-pay

## 2021-09-10 DIAGNOSIS — G509 Disorder of trigeminal nerve, unspecified: Secondary | ICD-10-CM

## 2021-09-11 ENCOUNTER — Ambulatory Visit (HOSPITAL_COMMUNITY)
Admission: RE | Admit: 2021-09-11 | Discharge: 2021-09-11 | Disposition: A | Discharge status: Home / Self Care | Payer: PPO | Source: Ambulatory Visit | Attending: Internal Medicine | Admitting: Internal Medicine

## 2021-09-11 ENCOUNTER — Ambulatory Visit (HOSPITAL_COMMUNITY): Admission: RE | Admit: 2021-09-11 | Payer: PPO | Source: Ambulatory Visit

## 2021-09-11 ENCOUNTER — Encounter (HOSPITAL_COMMUNITY): Payer: Self-pay

## 2021-09-11 DIAGNOSIS — G509 Disorder of trigeminal nerve, unspecified: Secondary | ICD-10-CM

## 2021-09-11 DIAGNOSIS — G508 Other disorders of trigeminal nerve: Secondary | ICD-10-CM | POA: Insufficient documentation

## 2021-09-11 DIAGNOSIS — R220 Localized swelling, mass and lump, head: Secondary | ICD-10-CM | POA: Diagnosis not present

## 2021-09-11 DIAGNOSIS — G500 Trigeminal neuralgia: Secondary | ICD-10-CM | POA: Diagnosis not present

## 2021-09-11 IMAGING — MR MR FACE/TRIGEMINAL WO/W CM
6 of 8 series · 23 of 48 positions shown · IV contrast (gadavist)
Comparison: None Available.

CLINICAL DATA: Refractory trigeminal neuralgia.

EXAM:
MRI FACE TRIGEMINAL WITHOUT AND WITH CONTRAST
TECHNIQUE: Multiplanar, multi-echo pulse sequences of the face and surrounding
structures, including thin-slice imaging of the trigeminal nerves,
were acquired before and after intravenous contrast administration.
CONTRAST:  8mL GADAVIST GADOBUTROL 1 MMOL/ML IV SOLN

[Series 10: T2 · coronal · 3.0mm · 0.70mm/px · 4 of 44 slices shown]
[im 1/44]
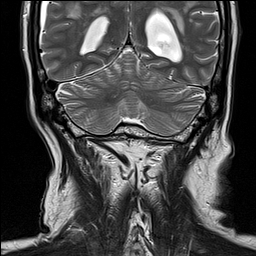
[im 15/44]
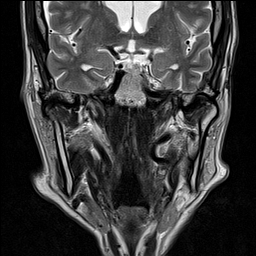
[im 29/44]
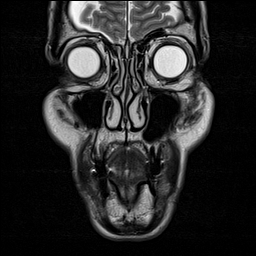
[im 44/44]
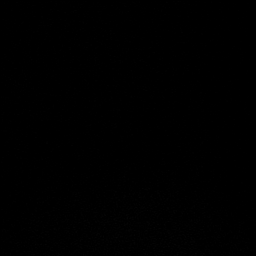

[Series 12: T1 · coronal · 3.0mm · 0.35mm/px · 5 of 53 slices shown (1 of 3)]
[im 1/53]
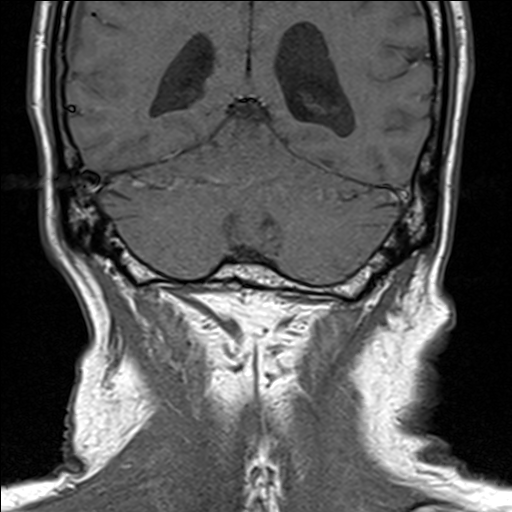
[im 14/53]
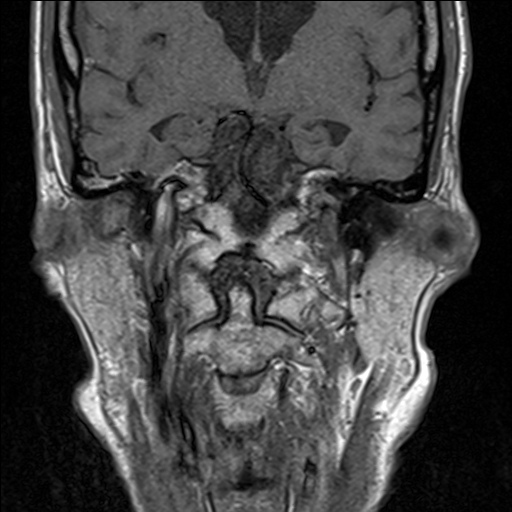
[im 27/53]
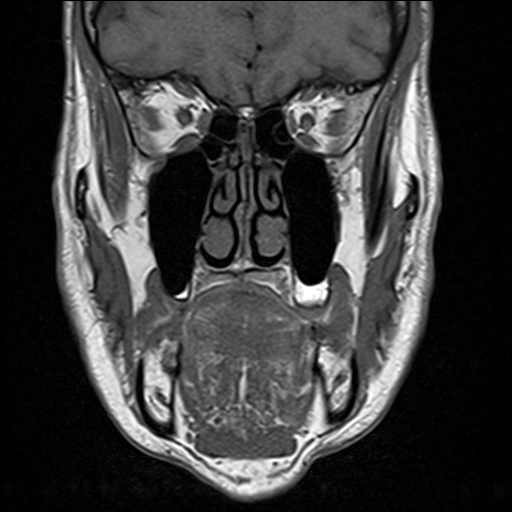
[im 40/53]
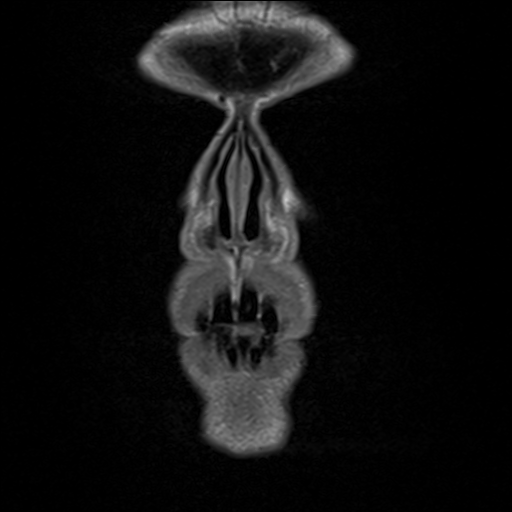
[im 53/53]
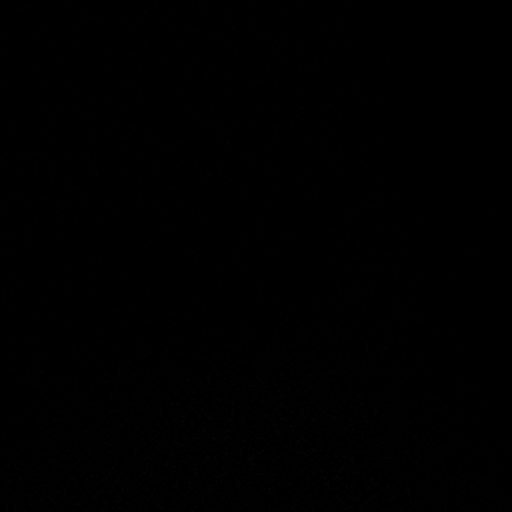

[Series 13: T1 · axial · 3.0mm · 0.35mm/px · z∈[-156,-9]mm · 4 of 38 slices shown (2 of 3)]
[im 1/38]
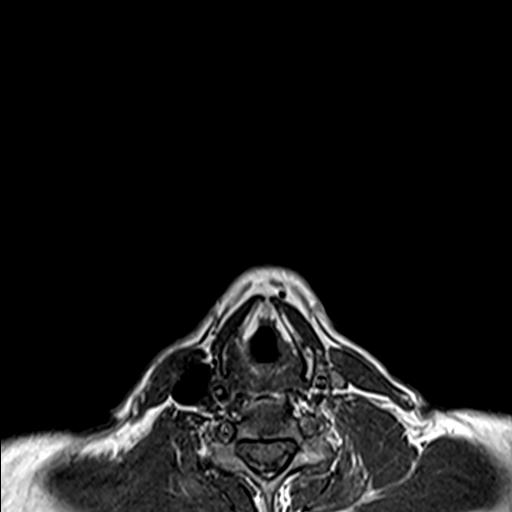
[im 13/38]
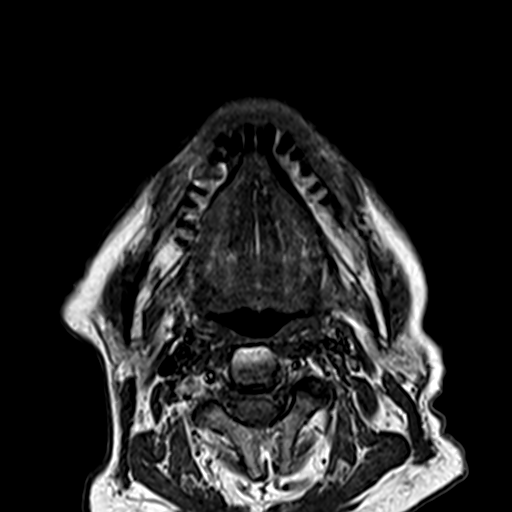
[im 25/38]
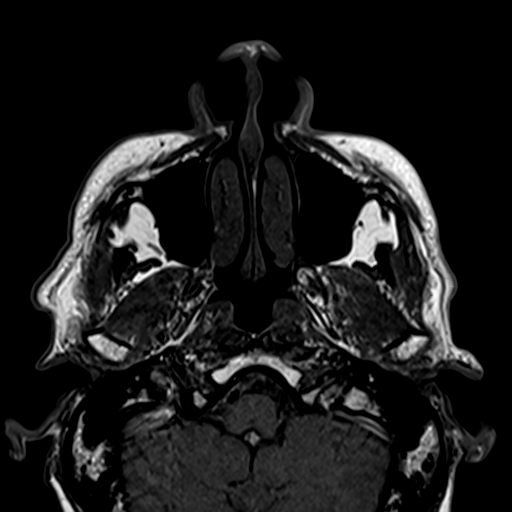
[im 38/38]
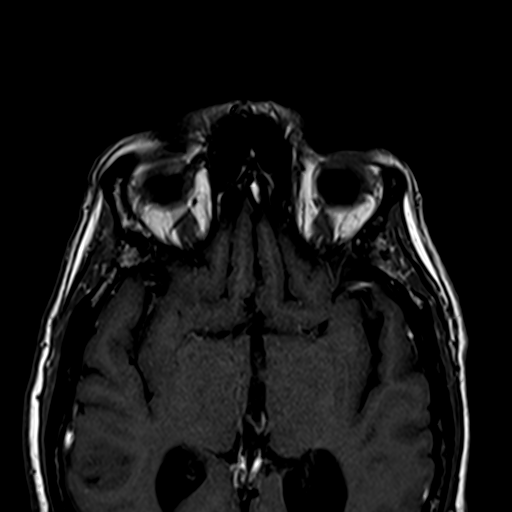

[Series 15: T1 · sagittal · 5.0mm · 0.57mm/px · 2 of 24 slices shown (3 of 3)]
[im 1/24]
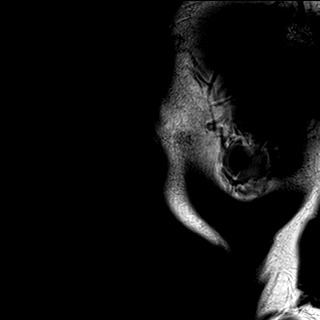
[im 24/24]
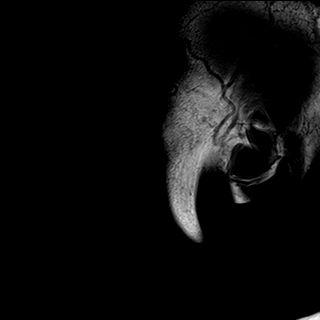

[Series 16: T1 fat-sat post-contrast · axial · 3.0mm · 0.35mm/px · z∈[-156,-9]mm · 4 of 38 slices shown (1 of 2)]
[im 1/38]
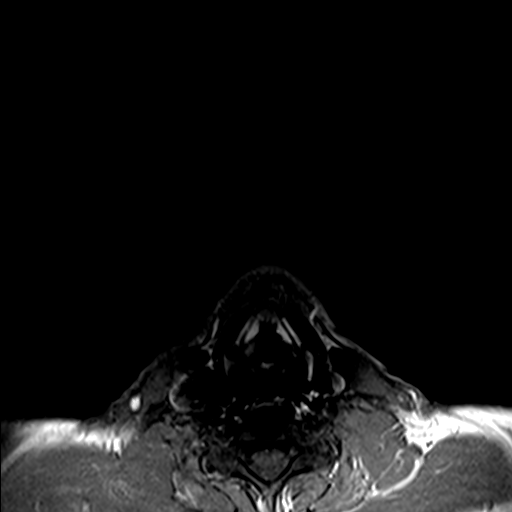
[im 13/38]
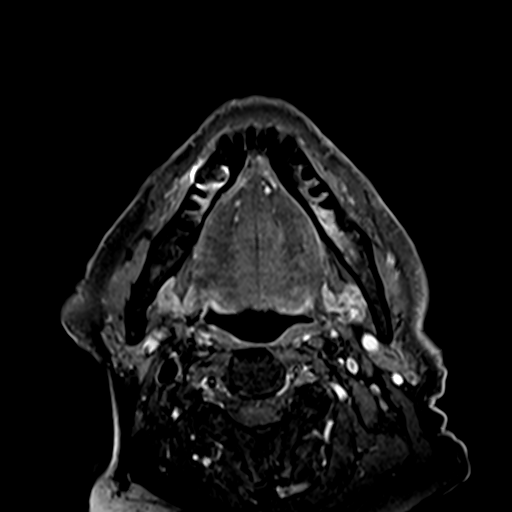
[im 25/38]
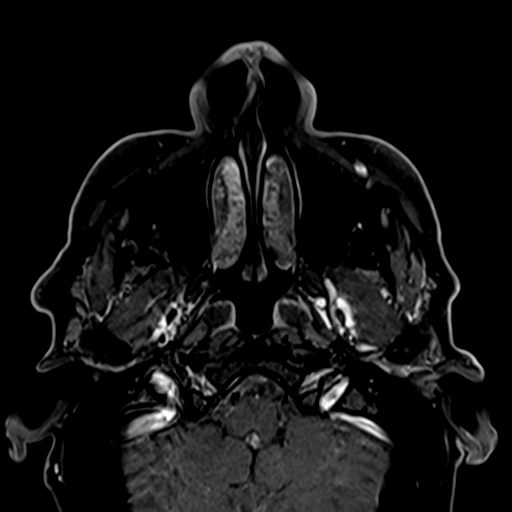
[im 38/38]
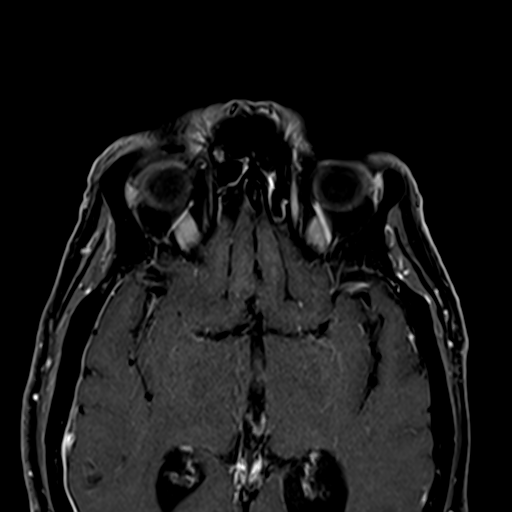

[Series 17: T1 fat-sat post-contrast · coronal · 3.0mm · 0.35mm/px · 4 of 53 slices shown (2 of 2)]
[im 1/53]
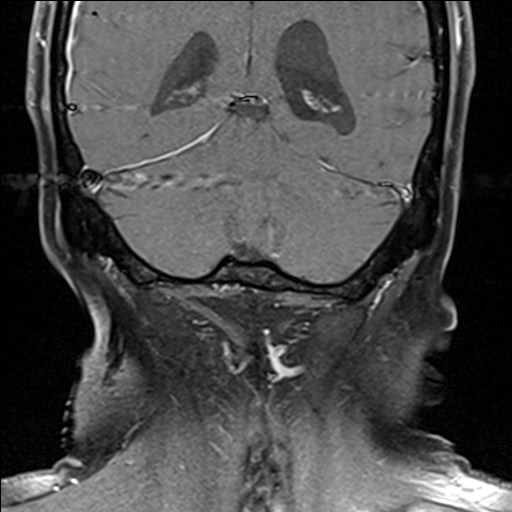
[im 14/53]
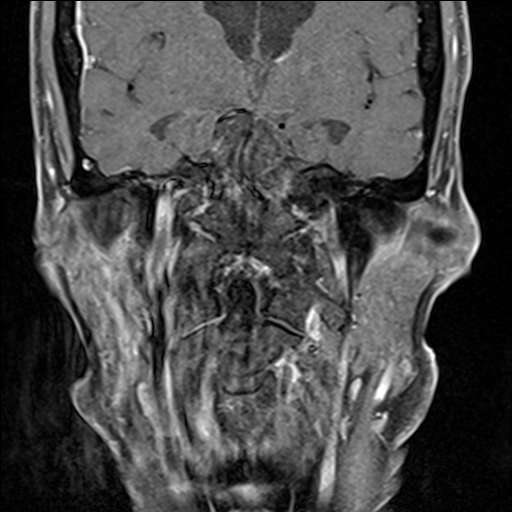
[im 27/53]
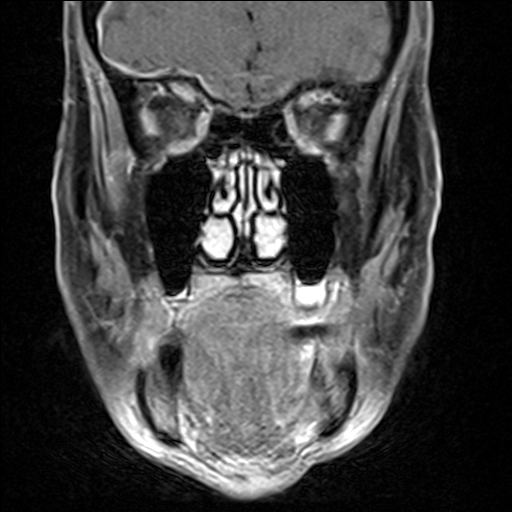
[im 40/53]
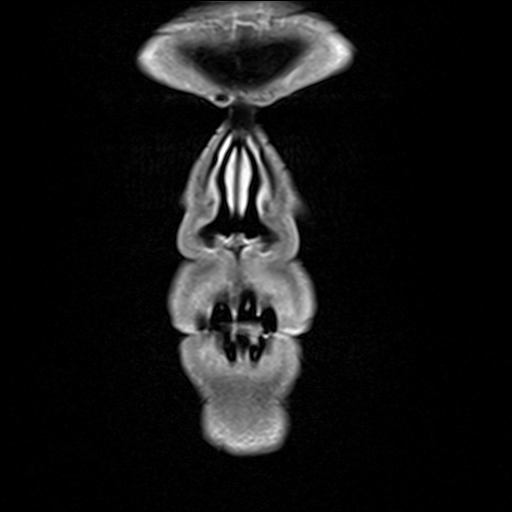

[23 of 48 positions shown; findings below may reference images not displayed]

FINDINGS: Limited intracranial/Trigeminal nerves: The visualized intracranial
contents are within normal limits. Foramen ovale is within normal
limits. The 3 nerves are well visualized and within normal limits.
Tumor mass lesion is present.

Brainstem is within normal limits. The trigeminal nerve root entry
zone is normal bilaterally. Other visualized cranial nerves are also
within normal limits.

No pathologic enhancement is present.

Vascular: Flow is present in the major intracranial arteries.

Sinuses/Orbits: The paranasal sinuses and mastoid air cells are
clear. The globes and orbits are within normal limits.

Soft tissues: Soft tissues the face are within normal limits.

Osseous: Marrow signal is within normal limits. No pathologic
enhancement is present.

Other: Muscles of mastication are within normal limits.
Submandibular and parotid glands and ducts are normal.
IMPRESSION: Negative MRI of the face and trigeminal nerves without and with
contrast.

## 2021-09-11 MED ORDER — GADOBUTROL 1 MMOL/ML IV SOLN
8.0000 mL | Freq: Once | INTRAVENOUS | Status: AC | PRN
  Administered 2021-09-11: 8 mL via INTRAVENOUS

## 2021-09-12 ENCOUNTER — Encounter: Payer: Self-pay | Admitting: Internal Medicine

## 2021-09-13 ENCOUNTER — Inpatient Hospital Stay: Payer: PPO | Attending: Internal Medicine

## 2021-09-13 DIAGNOSIS — G629 Polyneuropathy, unspecified: Secondary | ICD-10-CM | POA: Insufficient documentation

## 2021-09-13 DIAGNOSIS — Z833 Family history of diabetes mellitus: Secondary | ICD-10-CM | POA: Insufficient documentation

## 2021-09-13 DIAGNOSIS — Z8261 Family history of arthritis: Secondary | ICD-10-CM | POA: Insufficient documentation

## 2021-09-13 DIAGNOSIS — Z836 Family history of other diseases of the respiratory system: Secondary | ICD-10-CM | POA: Insufficient documentation

## 2021-09-13 DIAGNOSIS — R229 Localized swelling, mass and lump, unspecified: Secondary | ICD-10-CM | POA: Insufficient documentation

## 2021-09-13 DIAGNOSIS — D219 Benign neoplasm of connective and other soft tissue, unspecified: Secondary | ICD-10-CM | POA: Insufficient documentation

## 2021-09-13 DIAGNOSIS — Z8249 Family history of ischemic heart disease and other diseases of the circulatory system: Secondary | ICD-10-CM | POA: Insufficient documentation

## 2021-09-13 DIAGNOSIS — G500 Trigeminal neuralgia: Secondary | ICD-10-CM | POA: Insufficient documentation

## 2021-09-13 DIAGNOSIS — Z79899 Other long term (current) drug therapy: Secondary | ICD-10-CM | POA: Insufficient documentation

## 2021-09-14 ENCOUNTER — Other Ambulatory Visit: Payer: Self-pay

## 2021-09-14 ENCOUNTER — Inpatient Hospital Stay: Payer: PPO | Admitting: Internal Medicine

## 2021-09-14 VITALS — BP 164/78 | HR 91 | Temp 97.7°F | Resp 20 | Wt 184.5 lb

## 2021-09-14 DIAGNOSIS — G509 Disorder of trigeminal nerve, unspecified: Secondary | ICD-10-CM

## 2021-09-14 DIAGNOSIS — G500 Trigeminal neuralgia: Secondary | ICD-10-CM | POA: Diagnosis not present

## 2021-09-14 DIAGNOSIS — Z836 Family history of other diseases of the respiratory system: Secondary | ICD-10-CM | POA: Diagnosis not present

## 2021-09-14 DIAGNOSIS — R229 Localized swelling, mass and lump, unspecified: Secondary | ICD-10-CM | POA: Diagnosis not present

## 2021-09-14 DIAGNOSIS — Z833 Family history of diabetes mellitus: Secondary | ICD-10-CM | POA: Diagnosis not present

## 2021-09-14 DIAGNOSIS — D219 Benign neoplasm of connective and other soft tissue, unspecified: Secondary | ICD-10-CM | POA: Diagnosis not present

## 2021-09-14 DIAGNOSIS — Z8261 Family history of arthritis: Secondary | ICD-10-CM | POA: Diagnosis not present

## 2021-09-14 DIAGNOSIS — G629 Polyneuropathy, unspecified: Secondary | ICD-10-CM | POA: Diagnosis not present

## 2021-09-14 DIAGNOSIS — Z79899 Other long term (current) drug therapy: Secondary | ICD-10-CM | POA: Diagnosis not present

## 2021-09-14 DIAGNOSIS — Z8249 Family history of ischemic heart disease and other diseases of the circulatory system: Secondary | ICD-10-CM | POA: Diagnosis not present

## 2021-09-14 NOTE — Progress Notes (Signed)
[facility] [place],  [address] [phone]   Interval Evaluation  Date of Service: 09/14/21 Patient Name: Hunter Chang Patient MRN: [id number] Patient DOB: May 30, 1945 Provider: [name], MD  Identifying Statement:  Hunter Chang is a 75 y.o. male with Trigeminal neuropathy   Primary Cancer: Skin fibroxanthoma  Interval History: Hunter Chang presents today for follow up, after recent MRI/MRA study.  He describes some improvement with the increased dose of Tegretol, now '200mg'$  twice per day.  His symptoms fluctuate day to day, has as many as 12 episodes per day and as few as none.  No other new symptoms.  H+P (04/19/21) Patient presents to review recent neurologic complaint.  He describes sharp, intense pain in the right side of his forehead and right cheek area.  Symptoms are brief and self limited, less than a minute, and they occur ~3-4x per week only.  They began after surgical removal of skin mass, fibroxanthoma, with bony invasion.  He thinks the syndrome began prior to radiation treatments.  Overall it is not getting worse or better, and interferes minimally with overall quality of life.  He has no other neurologic complaints, no history of headache or facial pain.    Medications: Current Outpatient Medications on File Prior to Visit  Medication Sig Dispense Refill   aspirin 81 MG EC tablet Take 81 mg by mouth daily. Swallow whole.     carbamazepine (TEGRETOL-XR) 200 MG 12 hr tablet Take 1 tablet (200 mg total) by mouth 2 (two) times daily. 60 tablet 1   magnesium gluconate (MAGONATE) 500 MG tablet Take by mouth.     Multiple Vitamins-Minerals (ONE-A-DAY MENS 50+ ADVANTAGE) TABS Take 1 tablet by mouth daily.     Multiple Vitamins-Minerals (ZINC PO) Take 50 mg by mouth daily.     sildenafil (VIAGRA) 100 MG tablet Take 0.5-1 tablets (50-100 mg total) by mouth daily as needed for erectile dysfunction. 20 tablet 8   VITAMIN  D PO Take 1 tablet by mouth daily.     No current facility-administered medications on file prior to visit.    Allergies:  Allergies  Allergen Reactions   Succinylcholine Chloride Other (See Comments)    "Paralyzed my diaphragm"   Past Medical History:  Past Medical History:  Diagnosis Date   Arthritis    Chicken pox    Past Surgical History:  Past Surgical History:  Procedure Laterality Date   HERNIA REPAIR     3 times   Social History:  Social History   Socioeconomic History   Marital status: Married    Spouse name: Not on file   Number of children: Not on file   Years of education: Not on file   Highest education level: Not on file  Occupational History   Not on file  Tobacco Use   Smoking status: Never   Smokeless tobacco: Never  Vaping Use   Vaping Use: Never used  Substance and Sexual Activity   Alcohol use: No   Drug use: No   Sexual activity: Yes  Other Topics Concern   Not on file  Social History Narrative   Not on file   Social Determinants of Health   Financial Resource Strain: Low Risk  (12/12/2020)   Overall Financial Resource Strain (CARDIA)    Difficulty of Paying Living Expenses: Not hard at all  Food Insecurity: No Food Insecurity (12/12/2020)   Hunger Vital Sign    Worried About  Running Out of Food in the Last Year: Never true    [place] in the Last Year: Never true  Transportation Needs: No Transportation Needs (12/12/2020)   PRAPARE - Hydrologist (Medical): No    Lack of Transportation (Non-Medical): No  Physical Activity: Sufficiently Active (12/12/2020)   Exercise Vital Sign    Days of Exercise per Week: 5 days    Minutes of Exercise per Session: 50 min  Stress: No Stress Concern Present (12/12/2020)   [place]    Feeling of Stress : Not at all  Social Connections: [place] (12/12/2020)   Social Connection and  Isolation Panel [NHANES]    Frequency of Communication with Friends and Family: More than three times a week    Frequency of Social Gatherings with Friends and Family: More than three times a week    Attends Religious Services: More than 4 times per year    Active Member of [employer] or Organizations: Yes    Attends [employer] Meetings: More than 4 times per year    Marital Status: Married  Human resources officer Violence: Not At Risk (12/12/2020)   Humiliation, Afraid, Rape, and Kick questionnaire    Fear of Current or Ex-Partner: No    Emotionally Abused: No    Physically Abused: No    Sexually Abused: No   Family History:  Family History  Problem Relation Age of Onset   Arthritis Mother    Diabetes Father    Early death Father    Heart disease Father    Heart attack Father    Early death Maternal Grandfather    COPD Paternal Grandfather     Review of Systems: Constitutional: Doesn't report fevers, chills or abnormal weight loss Eyes: Doesn't report blurriness of vision Ears, nose, mouth, throat, and face: Doesn't report sore throat Respiratory: Doesn't report cough, dyspnea or wheezes Cardiovascular: Doesn't report palpitation, chest discomfort  Gastrointestinal:  Doesn't report nausea, constipation, diarrhea GU: Doesn't report incontinence Skin: Doesn't report skin rashes Neurological: Per HPI Musculoskeletal: Doesn't report joint pain Behavioral/Psych: Doesn't report anxiety  Physical Exam: Vitals:   09/14/21 1220  BP: (!) 164/78  Pulse: 91  Resp: 20  Temp: 97.7 F (36.5 C)  SpO2: 98%   KPS: 90. General: Alert, cooperative, pleasant, in no acute distress Head: Normal EENT: No conjunctival injection or scleral icterus.  Lungs: Resp effort normal Cardiac: Regular rate Abdomen: Non-distended abdomen Skin: Scalp lesion noted. Extremities: No clubbing or edema  Neurologic Exam: Mental Status: Awake, alert, attentive to examiner. Oriented to self and  environment. Language is fluent with intact comprehension.  Cranial Nerves: Visual acuity is grossly normal. Visual fields are full. Extra-ocular movements intact. No ptosis. Face is symmetric Motor: Tone and bulk are normal. Power is full in both arms and legs. Reflexes are symmetric, no pathologic reflexes present.  Sensory: Intact to light touch Gait: Normal.   Labs: I have reviewed the data as listed    Component Value Date/Time   NA 141 10/23/2020 1104   K 4.6 10/23/2020 1104   CL 104 10/23/2020 1104   CO2 30 10/23/2020 1104   GLUCOSE 92 10/23/2020 1104   BUN 17 10/23/2020 1104   CREATININE 1.09 10/23/2020 1104   CALCIUM 9.7 10/23/2020 1104   PROT 7.5 10/23/2020 1104   ALBUMIN 4.1 10/23/2020 1104   AST 27 10/23/2020 1104   ALT 24 10/23/2020 1104   ALKPHOS  107 10/23/2020 1104   BILITOT 0.7 10/23/2020 1104   Lab Results  Component Value Date   WBC 5.9 10/23/2020   NEUTROABS 3.7 10/23/2020   HGB 15.3 10/23/2020   HCT 44.7 10/23/2020   MCV 89.0 10/23/2020   PLT 243.0 10/23/2020    Imaging:  MR FACE/TRIGEMINAL WO/W CM  Result Date: 09/11/2021 CLINICAL DATA:  Refractory trigeminal neuralgia. EXAM: MRI FACE TRIGEMINAL WITHOUT AND WITH CONTRAST TECHNIQUE: Multiplanar, multi-echo pulse sequences of the face and surrounding structures, including thin-slice imaging of the trigeminal nerves, were acquired before and after intravenous contrast administration. CONTRAST:  39m GADAVIST GADOBUTROL 1 MMOL/ML IV SOLN COMPARISON:  None Available. FINDINGS: Limited intracranial/Trigeminal nerves: The visualized intracranial contents are within normal limits. Foramen ovale is within normal limits. The 3 nerves are well visualized and within normal limits. Tumor mass lesion is present. Brainstem is within normal limits. The trigeminal nerve root entry zone is normal bilaterally. Other visualized cranial nerves are also within normal limits. No pathologic enhancement is present. Vascular: Flow  is present in the major intracranial arteries. Sinuses/Orbits: The paranasal sinuses and mastoid air cells are clear. The globes and orbits are within normal limits. Soft tissues: Soft tissues the face are within normal limits. Osseous: Marrow signal is within normal limits. No pathologic enhancement is present. Other: Muscles of mastication are within normal limits. Submandibular and parotid glands and ducts are normal. IMPRESSION: Negative MRI of the face and trigeminal nerves without and with contrast. Electronically Signed   By: CSan MorelleM.D.   On: 09/11/2021 14:20    Assessment/Plan Trigeminal neuropathy  PDejay Kronkpresents with clinical syndrome localizing to right trigeminal nerve, V1 and V2 branches.  MRI did not demonstrate structural etiology or vascular loop.  We did discuss increasing Tegretol further, but he has elected to remain at '200mg'$  BID.  His sodium level will be checked by PCP next month.  We appreciate the opportunity to participate in the care of [employer]    We ask that PKhadar Mongerreturn to clinic in 6 months, or sooner as needed.  All questions were answered. The patient knows to call the clinic with any problems, questions or concerns. No barriers to learning were detected.  The total time spent in the encounter was 30 minutes and more than 50% was on counseling and review of test results   ZVentura Sellers MD Medical Director of Neuro-Oncology [facility]at [place]06/20/23 2:21 PM

## 2021-09-15 DIAGNOSIS — D485 Neoplasm of uncertain behavior of skin: Secondary | ICD-10-CM | POA: Diagnosis not present

## 2021-09-19 ENCOUNTER — Encounter: Payer: Self-pay | Admitting: Internal Medicine

## 2021-09-19 DIAGNOSIS — R29898 Other symptoms and signs involving the musculoskeletal system: Secondary | ICD-10-CM

## 2021-09-20 ENCOUNTER — Other Ambulatory Visit (INDEPENDENT_AMBULATORY_CARE_PROVIDER_SITE_OTHER): Payer: PPO

## 2021-09-20 DIAGNOSIS — R29898 Other symptoms and signs involving the musculoskeletal system: Secondary | ICD-10-CM | POA: Diagnosis not present

## 2021-09-20 LAB — BASIC METABOLIC PANEL
BUN: 10 mg/dL (ref 6–23)
CO2: 28 mEq/L (ref 19–32)
Calcium: 9.6 mg/dL (ref 8.4–10.5)
Chloride: 94 mEq/L — ABNORMAL LOW (ref 96–112)
Creatinine, Ser: 0.9 mg/dL (ref 0.40–1.50)
GFR: 83.71 mL/min (ref >60.00)
Glucose, Bld: 85 mg/dL (ref 70–99)
Potassium: 3.8 mEq/L (ref 3.5–5.1)
Sodium: 131 mEq/L — ABNORMAL LOW (ref 135–145)

## 2021-09-23 DIAGNOSIS — H40053 Ocular hypertension, bilateral: Secondary | ICD-10-CM | POA: Diagnosis not present

## 2021-09-26 ENCOUNTER — Encounter (HOSPITAL_COMMUNITY): Payer: Self-pay

## 2021-09-26 ENCOUNTER — Emergency Department (HOSPITAL_COMMUNITY)
Admission: EM | Admit: 2021-09-26 | Discharge: 2021-09-26 | Disposition: A | Discharge status: Home / Self Care | Payer: PPO | Attending: Emergency Medicine | Admitting: Emergency Medicine

## 2021-09-26 ENCOUNTER — Emergency Department (HOSPITAL_COMMUNITY): Payer: PPO

## 2021-09-26 ENCOUNTER — Other Ambulatory Visit: Payer: Self-pay

## 2021-09-26 DIAGNOSIS — Z7982 Long term (current) use of aspirin: Secondary | ICD-10-CM | POA: Insufficient documentation

## 2021-09-26 DIAGNOSIS — R930 Abnormal findings on diagnostic imaging of skull and head, not elsewhere classified: Secondary | ICD-10-CM | POA: Insufficient documentation

## 2021-09-26 DIAGNOSIS — M6281 Muscle weakness (generalized): Secondary | ICD-10-CM | POA: Diagnosis not present

## 2021-09-26 DIAGNOSIS — R519 Headache, unspecified: Secondary | ICD-10-CM | POA: Insufficient documentation

## 2021-09-26 DIAGNOSIS — G500 Trigeminal neuralgia: Secondary | ICD-10-CM | POA: Diagnosis not present

## 2021-09-26 DIAGNOSIS — Z79899 Other long term (current) drug therapy: Secondary | ICD-10-CM | POA: Insufficient documentation

## 2021-09-26 DIAGNOSIS — W133XXA Fall through floor, initial encounter: Secondary | ICD-10-CM | POA: Diagnosis not present

## 2021-09-26 DIAGNOSIS — G8929 Other chronic pain: Secondary | ICD-10-CM | POA: Insufficient documentation

## 2021-09-26 DIAGNOSIS — R21 Rash and other nonspecific skin eruption: Secondary | ICD-10-CM | POA: Insufficient documentation

## 2021-09-26 DIAGNOSIS — E876 Hypokalemia: Secondary | ICD-10-CM | POA: Diagnosis not present

## 2021-09-26 DIAGNOSIS — I10 Essential (primary) hypertension: Secondary | ICD-10-CM | POA: Diagnosis not present

## 2021-09-26 DIAGNOSIS — W19XXXA Unspecified fall, initial encounter: Secondary | ICD-10-CM | POA: Diagnosis not present

## 2021-09-26 DIAGNOSIS — R42 Dizziness and giddiness: Secondary | ICD-10-CM | POA: Diagnosis not present

## 2021-09-26 DIAGNOSIS — R531 Weakness: Secondary | ICD-10-CM | POA: Diagnosis not present

## 2021-09-26 DIAGNOSIS — G4489 Other headache syndrome: Secondary | ICD-10-CM | POA: Diagnosis not present

## 2021-09-26 DIAGNOSIS — G459 Transient cerebral ischemic attack, unspecified: Secondary | ICD-10-CM | POA: Diagnosis not present

## 2021-09-26 HISTORY — DX: Rubella without complication: B06.9

## 2021-09-26 LAB — CBC WITH DIFFERENTIAL/PLATELET
Abs Immature Granulocytes: 0.03 10*3/uL (ref 0.00–0.07)
Basophils Absolute: 0.1 10*3/uL (ref 0.0–0.1)
Basophils Relative: 1 %
Eosinophils Absolute: 0.1 10*3/uL (ref 0.0–0.5)
Eosinophils Relative: 1 %
HCT: 41.9 % (ref 39.0–52.0)
Hemoglobin: 14.8 g/dL (ref 13.0–17.0)
Immature Granulocytes: 0 %
Lymphocytes Relative: 13 %
Lymphs Abs: 0.9 10*3/uL (ref 0.7–4.0)
MCH: 30.3 pg (ref 26.0–34.0)
MCHC: 35.3 g/dL (ref 30.0–36.0)
MCV: 85.7 fL (ref 80.0–100.0)
Monocytes Absolute: 0.8 10*3/uL (ref 0.1–1.0)
Monocytes Relative: 11 %
Neutro Abs: 5.4 10*3/uL (ref 1.7–7.7)
Neutrophils Relative %: 74 %
Platelets: 333 10*3/uL (ref 150–400)
RBC: 4.89 MIL/uL (ref 4.22–5.81)
RDW: 12 % (ref 11.5–15.5)
WBC: 7.2 10*3/uL (ref 4.0–10.5)
nRBC: 0 % (ref 0.0–0.2)

## 2021-09-26 LAB — PROTIME-INR
INR: 0.9 (ref 0.8–1.2)
Prothrombin Time: 12.2 seconds (ref 11.4–15.2)

## 2021-09-26 LAB — COMPREHENSIVE METABOLIC PANEL
ALT: 31 U/L (ref 0–44)
AST: 23 U/L (ref 15–41)
Albumin: 3.9 g/dL (ref 3.5–5.0)
Alkaline Phosphatase: 178 U/L — ABNORMAL HIGH (ref 38–126)
Anion gap: 10 (ref 5–15)
BUN: 10 mg/dL (ref 8–23)
CO2: 24 mmol/L (ref 22–32)
Calcium: 9.3 mg/dL (ref 8.9–10.3)
Chloride: 101 mmol/L (ref 98–111)
Creatinine, Ser: 0.81 mg/dL (ref 0.61–1.24)
GFR, Estimated: >60 mL/min (ref >60)
Glucose, Bld: 116 mg/dL — ABNORMAL HIGH (ref 70–99)
Potassium: 3.4 mmol/L — ABNORMAL LOW (ref 3.5–5.1)
Sodium: 135 mmol/L (ref 135–145)
Total Bilirubin: 0.6 mg/dL (ref 0.3–1.2)
Total Protein: 8 g/dL (ref 6.5–8.1)

## 2021-09-26 LAB — APTT: aPTT: 28 seconds (ref 24–36)

## 2021-09-26 MED ORDER — LORAZEPAM 1 MG PO TABS: 1.0000 mg | ORAL_TABLET | Freq: Once | ORAL | Status: DC | PRN

## 2021-09-26 NOTE — Discharge Instructions (Signed)
There is no clear cause for the episode of weakness that you had last week and this week.  The evaluations today while showing some abnormalities do not show any serious problems that would have caused this episode.  There is a small possibility of either a seizure or cardiac arrhythmia causing the transient weakness.  There is no evidence for TIA or stroke at this time.  There does not appear to be any acute brain problems that require interventions.  Make sure you are getting plenty of rest and drinking a lot of fluids to help maintain your strength.  Continue your usual treatments at home.  Return here, if needed.

## 2021-09-26 NOTE — ED Triage Notes (Addendum)
Per EMS, patient from home, reports fall this morning after he his legs "gave out and went numb." Denies head injury and LOC. Reports wife helped him up and his leg sensation returned to baseline after approximately 20 minutes.  BP 160/80 HR 100 98% CBG 128

## 2021-09-26 NOTE — ED Triage Notes (Signed)
Patient denies taking any blood thinners.

## 2021-09-26 NOTE — ED Provider Notes (Signed)
[facility] DEPT Provider Note   CSN: [id number] Arrival date & time: 09/26/21  0907     History  No chief complaint on file.   Hunter Chang is a 76 y.o. male.  HPI Patient presents for evaluation of a period of leg weakness bilaterally which caused him to fall to the floor.  He did not injure himself.  The symptoms were transient and by the time EMS arrived to his home he was able to ambulate to their vehicle and transported here.  He had a similar episode, 1 week ago when he was standing in the shower however never lasted a few minutes that he did not fall at that time.  He denies associated back pain, chest pain, nausea, vomiting, cough, shortness of breath.  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  He is recovering from a rash of his head, status post treatment and recently had follow-up MRI which he said was normal.  He is not having fever, chills, paresthesia, focal weakness or dizziness.    Home Medications Prior to Admission medications   Medication Sig Start Date End Date Taking? Authorizing Provider  aspirin 81 MG EC tablet Take 81 mg by mouth daily. Swallow whole.    [provider]  carbamazepine (TEGRETOL-XR) 200 MG 12 hr tablet Take 1 tablet (200 mg total) by mouth 2 (two) times daily. 09/02/21   [name], MD  magnesium gluconate (MAGONATE) 500 MG tablet Take by mouth.    [provider]  Multiple Vitamins-Minerals (ONE-A-DAY MENS 50+ ADVANTAGE) TABS Take 1 tablet by mouth daily.    [provider]  Multiple Vitamins-Minerals (ZINC PO) Take 50 mg by mouth daily.    [provider]  sildenafil (VIAGRA) 100 MG tablet Take 0.5-1 tablets (50-100 mg total) by mouth daily as needed for erectile dysfunction. 04/28/21   Binnie Rail, MD  VITAMIN D PO Take 1 tablet by mouth daily.    [provider]      Allergies     Succinylcholine chloride    Review of Systems   Review of Systems  Physical Exam Updated Vital Signs BP (!) 145/80   Pulse 80   Temp 98.4 F (36.9 C) (Oral)   Resp 16   Ht 5' 8.5" (1.74 m)   Wt 81.6 kg   SpO2 100%   BMI 26.97 kg/m  Physical Exam  ED Results / Procedures / Treatments   Labs (all labs ordered are listed, but only abnormal results are displayed) Labs Reviewed  COMPREHENSIVE METABOLIC PANEL - Abnormal; Notable for the following components:      Result Value   Potassium 3.4 (*)    Glucose, Bld 116 (*)    Alkaline Phosphatase 178 (*)    All other components within normal limits  CBC WITH DIFFERENTIAL/PLATELET  APTT  PROTIME-INR    EKG EKG Interpretation  Date/Time:  Sunday September 26 2021 14:51:32 EDT Ventricular Rate:  78 PR Interval:  157 QRS Duration: 105 QT Interval:  382 QTC Calculation: 436 R Axis:   5 Text Interpretation: Sinus rhythm RSR' in V1 or V2, right VCD or RVH No old tracing to compare Confirmed by Daleen Bo [phone]) on 09/26/2021 3:38:40 PM  Radiology MR BRAIN WO CONTRAST  Result Date: 09/26/2021 CLINICAL DATA:  TIA. Patient fell today. Patient states legs gave out and went numb. Numbness sensation lasted approximately 20  minutes. EXAM: MRI HEAD WITHOUT CONTRAST TECHNIQUE: Multiplanar, multiecho pulse sequences of the brain and surrounding structures were obtained without intravenous contrast. COMPARISON:  CT head without contrast 09/26/2021 FINDINGS: Brain: A thin extra-axial collection is present over the right convexity measuring up to 5 mm. No significant mass effect is associated. Moderate atrophy and scattered white matter disease is present otherwise. No other acute intracranial abnormality is present. No acute infarct or hemorrhage is present. The ventricles are proportionate to the degree of atrophy. No other significant extra-axial fluid collection is present. The internal auditory canals are within normal limits. The brainstem  and cerebellum are within normal limits. Vascular: Flow is present in the major intracranial arteries. Skull and upper cervical spine: The craniocervical junction is normal. Upper cervical spine is within normal limits. Marrow signal is unremarkable. Postoperative and radiation changes to the posterior frontal scalp and skull are noted. Sinuses/Orbits: The paranasal sinuses and mastoid air cells are clear. The globes and orbits are within normal limits. IMPRESSION: 1. Thin extra-axial fluid collection over the right convexity measuring up to 5 mm without significant mass effect. This likely represents a hygroma. Acuity is indeterminate. No mass effect is present. No blood products are associated. 2. No acute intracranial abnormality. 3. Moderate atrophy and scattered white matter disease is moderately advanced for age. This likely reflects the sequela of chronic microvascular ischemia. 4. Post treatment changes to the scalp and vertex over the skull, likely related to prior radiation. Electronically Signed   By: San Morelle M.D.   On: 09/26/2021 14:48   CT Head Wo Contrast  Result Date: 09/26/2021 CLINICAL DATA:  Acute onset left lower extremity weakness and numbness. Transient ischemic attack. EXAM: CT HEAD WITHOUT CONTRAST TECHNIQUE: Contiguous axial images were obtained from the base of the skull through the vertex without intravenous contrast. RADIATION DOSE REDUCTION: This exam was performed according to the departmental dose-optimization program which includes automated exposure control, adjustment of the mA and/or kV according to patient size and/or use of iterative reconstruction technique. COMPARISON:  None Available. FINDINGS: Brain: No evidence of intracranial hemorrhage, acute infarction, hydrocephalus, or mass lesion/mass effect. A small chronic right frontal subdural hygroma is seen measuring approximately 5 mm in thickness, without significant mass effect. Postsurgical changes seen  involving the high right parietal scalp and calvarium. Moderate chronic small vessel disease is also seen. Vascular:  No hyperdense vessel or other acute findings. Skull: No evidence of fracture or other significant bone abnormality. Sinuses/Orbits:  No acute findings. Other: None. IMPRESSION: No acute intracranial abnormality. 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 Electronically Signed   By: Marlaine Hind M.D.   On: 09/26/2021 10:19    Procedures Procedures    Medications Ordered in ED Medications  LORazepam (ATIVAN) tablet 1 mg (has no administration in time range)    ED Course/ Medical Decision Making/ A&P Clinical Course as of 09/26/21 1722  Sun Sep 26, 2021  1632 MR BRAIN WO CONTRAST [EW]    Clinical Course User Index [EW] Daleen Bo, MD                           Medical Decision Making Patient presenting with transient leg weakness which caused the fall.  He did not have associated headache, paresthesia, neck or back pain.  Similar episode occurred 1 week ago.  He is being managed at home for trigeminal neuralgia.  Recently had a trigeminal nerve MRI which was  reassuring.  He is on Tegretol for trigeminal neuralgia.  He has never had a seizure.  No cardiac history.  No anticoagulants.  Differential diagnosis includes spinal myelopathy, cardiac arrhythmia and TIA.  Patient requires screening for stroke, metabolic disorders and cardiac assessment.  Amount and/or Complexity of Data Reviewed Independent Historian:     Details: He is a cogent historian External Data Reviewed: notes.    Details: Last week he contacted his PCP about possible hyponatremia and had a metabolic panel done.  Sodium was slightly low at 131.  No interventions were undertaken.  He was not told to alter his fluid intake.  Apparently this was done because of the prior episode of leg weakness. Labs: ordered.    Details: CBC, metabolic panel-normal except potassium  low, glucose high, alk phos stays high Radiology: ordered and independent interpretation performed.    Details: CT head, MRI brain-possible new small hygroma. ECG/medicine tests: ordered and independent interpretation performed.    Details: Cardiac monitor -- normal sinus rhythm; no arrhythmias Discussion of management or test interpretation with external provider(s): Case discussed with neuro hospitalist, Dr. Cheral Marker.  We reviewed the MRI brain images together.  He is concerned about the possibility of subarachnoid hemorrhage in the right frontal cortex.  He suggested that I discussed the case with the radiologist who read the MRI.  I then discussed the MRI images with Dr. Jobe Igo, the neuro radiologist.  He feels that the abnormality seen by Dr. Cheral Marker is related to his prior radiation therapy, for his scalp tumor.  He does not see any sign or have any concern for subarachnoid hemorrhage.  Risk Decision regarding hospitalization. Risk Details: Patient presenting with weakness which caused him to fall.  This was characterized by bilateral leg weakness, which lasted 15 minutes.  He did not have any injury from fall.  He had a similar more mild episode about a week ago.  Chronic problems include trigeminal neuralgia and scalp lesion which has been managed with chemotherapy and radiation.  He is followed closely by neuro-oncology.  He has been referred to another specialist regarding the trigeminal abnormality.  Differential diagnosis today includes nonspecific weakness, metabolic disorders, spine disorders, TIA, seizure and cardiac arrhythmia.  Evaluation today is reassuring.  CT head and brain MRI does not show subarachnoid hemorrhage, this was discussed with neuro radiologist.  Cannot rule out seizure however there was no evidence for shaking, or postictal state.  Cardiac arrhythmias felt to be unlikely due to no cardiac history and reassuring evaluation in the ED.  Doubt acute MI.  There is no metabolic  disorder potentiate cardiac arrhythmias.  Patient stable for discharge with outpatient management.  Recommend that he follow-up with his PCP for possible cardiac work-up as an outpatient.  Recommend that he follow-up with his neuro oncologist, to discuss possible further neurology evaluation, with consideration for EEG and/or general neurology consultation.  This can be done as outpatient safely.  These findings were discussed with the patient and his wife and they agree to the plan.           Final Clinical Impression(s) / ED Diagnoses Final diagnoses:  Weakness    Rx / DC Orders ED Discharge Orders     None         Daleen Bo, MD 09/26/21 1722

## 2021-09-26 NOTE — ED Provider Triage Note (Addendum)
Emergency Medicine Provider Triage Evaluation Note  Hunter Chang , a 76 y.o. male  was evaluated in triage.  Pt complains of left leg weakness around 0830 this AM. The patient reports that he had weakness in the left leg and was having to drag his left leg, but it resolved and was able to walk onto the ambulance. The patient reports that he had weakness and fell to his knees. Did not hit his head.  Denies any trouble speaking. Reports that he has trigeminal neuralgia and had right sided headaches yesterday. 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 Strength normal bilaterally. Cranial nerve II-XII intact.   Medical Decision Making  Medically screening exam initiated at 9:27 AM.  Appropriate orders placed.  Hannah Beat was informed that the remainder of the evaluation will be completed by another provider, this initial triage assessment does not replace that evaluation, and the importance of remaining in the ED until their evaluation is complete.  Will order CT imaging.    Sherrell Puller, PA-C 09/26/21 0931    Sherrell Puller, PA-C 09/26/21 [phone]

## 2021-09-27 ENCOUNTER — Encounter: Payer: Self-pay | Admitting: Internal Medicine

## 2021-09-29 ENCOUNTER — Ambulatory Visit (INDEPENDENT_AMBULATORY_CARE_PROVIDER_SITE_OTHER): Payer: PPO | Admitting: Family Medicine

## 2021-09-29 ENCOUNTER — Encounter: Payer: Self-pay | Admitting: Family Medicine

## 2021-09-29 VITALS — BP 136/70 | HR 75 | Temp 97.8°F | Ht 68.5 in | Wt 176.0 lb

## 2021-09-29 DIAGNOSIS — R531 Weakness: Secondary | ICD-10-CM | POA: Insufficient documentation

## 2021-09-29 DIAGNOSIS — R5383 Other fatigue: Secondary | ICD-10-CM

## 2021-09-29 DIAGNOSIS — R29898 Other symptoms and signs involving the musculoskeletal system: Secondary | ICD-10-CM | POA: Diagnosis not present

## 2021-09-29 DIAGNOSIS — R6882 Decreased libido: Secondary | ICD-10-CM | POA: Insufficient documentation

## 2021-09-29 LAB — VITAMIN D 25 HYDROXY (VIT D DEFICIENCY, FRACTURES): VITD: 48.75 ng/mL (ref 30.00–100.00)

## 2021-09-29 LAB — COMPREHENSIVE METABOLIC PANEL
ALT: 32 U/L (ref 0–53)
AST: 22 U/L (ref 0–37)
Albumin: 4.1 g/dL (ref 3.5–5.2)
Alkaline Phosphatase: 188 U/L — ABNORMAL HIGH (ref 39–117)
BUN: 11 mg/dL (ref 6–23)
CO2: 30 mEq/L (ref 19–32)
Calcium: 9.8 mg/dL (ref 8.4–10.5)
Chloride: 93 mEq/L — ABNORMAL LOW (ref 96–112)
Creatinine, Ser: 0.95 mg/dL (ref 0.40–1.50)
GFR: 78.44 mL/min (ref >60.00)
Glucose, Bld: 116 mg/dL — ABNORMAL HIGH (ref 70–99)
Potassium: 4.4 mEq/L (ref 3.5–5.1)
Sodium: 130 mEq/L — ABNORMAL LOW (ref 135–145)
Total Bilirubin: 0.4 mg/dL (ref 0.2–1.2)
Total Protein: 7.6 g/dL (ref 6.0–8.3)

## 2021-09-29 LAB — MAGNESIUM: Magnesium: 2.3 mg/dL (ref 1.5–2.5)

## 2021-09-29 LAB — VITAMIN B12: Vitamin B-12: >1504 pg/mL — ABNORMAL HIGH (ref 211–911)

## 2021-09-29 LAB — T4, FREE: Free T4: 0.95 ng/dL (ref 0.60–1.60)

## 2021-09-29 LAB — TSH: TSH: 0.52 u[IU]/mL (ref 0.35–5.50)

## 2021-09-29 NOTE — Patient Instructions (Signed)
It was a pleasure meeting you today.   Please go to the lab downstairs before leaving.   We will be in touch with your results.

## 2021-09-29 NOTE — Progress Notes (Signed)
Subjective:     Patient ID: Hunter Chang, male    DOB: [birth year], 76 y.o.   MRN: [id number]  Chief Complaint  Patient presents with   Hospitalization Follow-up    F/u from hospital on 7/2 for bilateral leg weakness. States he got up from recliner, took 3 steps and legs gave out and he fell. States he passed all stroke test. Had slight occurrence while he was in the shower a little over a week ago and lasted about 3-4 mins.     HPI Patient is in today for 2 episodes of bilateral leg weakness. Both times he had bilateral leg weakness for 4-7 minutes with symptoms resolving spontaneously prior to EMS arriving. Denies injury from fall.   Denies fever, chills, focal weakness, paresthesias, dizziness, chest pain, palpitations, shortness of breath, back pain, abdominal pain, N/V/D, urinary symptoms, LE edema.  No new headaches, he does have headaches related to trigeminal neuralgia and is on Tegretol for this.   He has been worked up for both episodes in the ED. Last ED visit was 09/26/2021. Scheduled to see neurology tomorrow.   Denies cardiac hx, seizure hx.   Recent MRI brain, CT head, EkG and labs.   His wife would like for patient's testosterone to be checked based on fatigue, muscle weakness, ED and decreased libido. Denies hx of hypogonadism.    Reports taking supplements including over the counter Magnesium and Zinc daily    Health Maintenance Due  Topic Date Due   COVID-19 Vaccine (1) Never done   Hepatitis C Screening  Never done   Zoster Vaccines- Shingrix (2 of 2) 01/26/2019    Past Medical History:  Diagnosis Date   Arthritis    Chicken pox    [place] measles     Past Surgical History:  Procedure Laterality Date   HERNIA REPAIR     3 times    Family History  Problem Relation Age of Onset   Arthritis Mother    Diabetes Father    Early death Father    Heart disease Father    Heart attack Father    Early death Maternal Grandfather    COPD Paternal  Grandfather     Social History   Socioeconomic History   Marital status: Married    Spouse name: Not on file   Number of children: Not on file   Years of education: Not on file   Highest education level: Not on file  Occupational History   Not on file  Tobacco Use   Smoking status: Never   Smokeless tobacco: Never  Vaping Use   Vaping Use: Never used  Substance and Sexual Activity   Alcohol use: No   Drug use: No   Sexual activity: Yes  Other Topics Concern   Not on file  Social History Narrative   Not on file   Social Determinants of Health   Financial Resource Strain: Low Risk  (12/12/2020)   Overall Financial Resource Strain (CARDIA)    Difficulty of Paying Living Expenses: Not hard at all  Food Insecurity: No Food Insecurity (12/12/2020)   Hunger Vital Sign    Worried About Running Out of Food in the Last Year: Never true    Ran Out of Food in the Last Year: Never true  Transportation Needs: No Transportation Needs (12/12/2020)   PRAPARE - Hydrologist (Medical): No    Lack of Transportation (Non-Medical): No  Physical Activity: Sufficiently Active (12/12/2020)  Exercise Vital Sign    Days of Exercise per Week: 5 days    Minutes of Exercise per Session: 50 min  Stress: No Stress Concern Present (12/12/2020)   [place]    Feeling of Stress : Not at all  Social Connections: [place] (12/12/2020)   Social Connection and Isolation Panel [NHANES]    Frequency of Communication with Friends and Family: More than three times a week    Frequency of Social Gatherings with Friends and Family: More than three times a week    Attends Religious Services: More than 4 times per year    Active Member of [employer] or Organizations: Yes    Attends Music therapist: More than 4 times per year    Marital Status: Married  Human resources officer Violence: Not At Risk  (12/12/2020)   Humiliation, Afraid, Rape, and Kick questionnaire    Fear of Current or Ex-Partner: No    Emotionally Abused: No    Physically Abused: No    Sexually Abused: No    Outpatient Medications Prior to Visit  Medication Sig Dispense Refill   aspirin 81 MG EC tablet Take 81 mg by mouth daily. Swallow whole.     carbamazepine (TEGRETOL-XR) 200 MG 12 hr tablet Take 1 tablet (200 mg total) by mouth 2 (two) times daily. 60 tablet 1   magnesium gluconate (MAGONATE) 500 MG tablet Take by mouth.     Multiple Vitamins-Minerals (ONE-A-DAY MENS 50+ ADVANTAGE) TABS Take 1 tablet by mouth daily.     Multiple Vitamins-Minerals (ZINC PO) Take 50 mg by mouth daily.     sildenafil (VIAGRA) 100 MG tablet Take 0.5-1 tablets (50-100 mg total) by mouth daily as needed for erectile dysfunction. 20 tablet 8   VITAMIN D PO Take 1 tablet by mouth daily.     No facility-administered medications prior to visit.    Allergies  Allergen Reactions   Succinylcholine Chloride Other (See Comments)    "Paralyzed my diaphragm"    ROS Pertinent positives and negatives in the history of present illness.     Objective:    Physical Exam Constitutional:      General: He is not in acute distress.    Appearance: He is not ill-appearing.  HENT:     Mouth/Throat:     Mouth: Mucous membranes are moist.  Eyes:     Extraocular Movements: Extraocular movements intact.     Conjunctiva/sclera: Conjunctivae normal.     Pupils: Pupils are equal, round, and reactive to light.  Cardiovascular:     Rate and Rhythm: Normal rate and regular rhythm.     Pulses: Normal pulses.  Pulmonary:     Effort: Pulmonary effort is normal.     Breath sounds: Normal breath sounds.  Musculoskeletal:        General: Normal range of motion.     Cervical back: Normal range of motion and neck supple.  Skin:    General: Skin is warm and dry.  Neurological:     General: No focal deficit present.     Mental Status: He is alert and  oriented to person, place, and time.     Cranial Nerves: No cranial nerve deficit or facial asymmetry.     Sensory: No sensory deficit.     Motor: No weakness, tremor or pronator drift.     Coordination: Romberg sign negative. Coordination normal. Finger-Nose-Finger Test normal.     Gait: Gait normal.  Deep Tendon Reflexes: Reflexes normal.  Psychiatric:        Mood and Affect: Mood normal.        Thought Content: Thought content normal.     BP 136/70 (BP Location: Left Arm, Patient Position: Sitting, Cuff Size: Large)   Pulse 75   Temp 97.8 F (36.6 C) (Temporal)   Ht 5' 8.5" (1.74 m)   Wt 176 lb (79.8 kg)   SpO2 95%   BMI 26.37 kg/m  Wt Readings from Last 3 Encounters:  09/29/21 176 lb (79.8 kg)  09/26/21 180 lb (81.6 kg)  09/14/21 184 lb 8 oz (83.7 kg)       Assessment & Plan:   Problem List Items Addressed This Visit       Other   Decreased libido    Discussed that this may be multifactorial. Check labs including testosterone and follow up.       Relevant Orders   Testosterone , Free and Total   Fatigue    Persist for several weeks per wife. Will check labs including testosterone per request. Follow up pending labs.       Relevant Orders   Comprehensive metabolic panel (Completed)   TSH (Completed)   T4, free (Completed)   Vitamin B12 (Completed)   VITAMIN D 25 Hydroxy (Vit-D Deficiency, Fractures) (Completed)   Magnesium (Completed)   Zinc   Testosterone , Free and Total   Weakness of both legs - Primary    In his usual state of health today without weakness. Benign neuro exam.  Reviewed notes and results from ED visit including CT head and MRI.  Will check labs to screen for underlying etiology.  See neurologist as scheduled tomorrow.  Follow up in August with Dr. Quay Burow as scheduled.       Relevant Orders   Comprehensive metabolic panel (Completed)   TSH (Completed)   T4, free (Completed)   Vitamin B12 (Completed)   VITAMIN D 25 Hydroxy  (Vit-D Deficiency, Fractures) (Completed)   Magnesium (Completed)   Zinc   Testosterone , Free and Total    I am having Hunter K. Tugwell "[name]" maintain his aspirin EC, One-A-Day Mens 50+ Advantage, magnesium gluconate, Multiple Vitamins-Minerals (ZINC PO), VITAMIN D PO, sildenafil, and carbamazepine.  No orders of the defined types were placed in this encounter.

## 2021-09-29 NOTE — Assessment & Plan Note (Signed)
Persist for several weeks per wife. Will check labs including testosterone per request. Follow up pending labs.

## 2021-09-29 NOTE — Assessment & Plan Note (Signed)
In his usual state of health today without weakness. Benign neuro exam.  Reviewed notes and results from ED visit including CT head and MRI.  Will check labs to screen for underlying etiology.  See neurologist as scheduled tomorrow.  Follow up in August with Dr. Quay Burow as scheduled.

## 2021-09-29 NOTE — Assessment & Plan Note (Addendum)
Discussed that this may be multifactorial. Check labs including testosterone and follow up.

## 2021-09-30 ENCOUNTER — Other Ambulatory Visit: Payer: Self-pay

## 2021-09-30 ENCOUNTER — Inpatient Hospital Stay: Payer: PPO | Attending: Internal Medicine | Admitting: Internal Medicine

## 2021-09-30 VITALS — BP 144/84 | HR 90 | Temp 98.3°F | Resp 16 | Ht 68.0 in | Wt 175.7 lb

## 2021-09-30 DIAGNOSIS — R531 Weakness: Secondary | ICD-10-CM | POA: Insufficient documentation

## 2021-09-30 DIAGNOSIS — Z79899 Other long term (current) drug therapy: Secondary | ICD-10-CM | POA: Insufficient documentation

## 2021-09-30 DIAGNOSIS — W19XXXA Unspecified fall, initial encounter: Secondary | ICD-10-CM | POA: Insufficient documentation

## 2021-09-30 DIAGNOSIS — R29898 Other symptoms and signs involving the musculoskeletal system: Secondary | ICD-10-CM

## 2021-09-30 DIAGNOSIS — G9608 Other cranial cerebrospinal fluid leak: Secondary | ICD-10-CM | POA: Diagnosis not present

## 2021-09-30 DIAGNOSIS — E871 Hypo-osmolality and hyponatremia: Secondary | ICD-10-CM | POA: Diagnosis not present

## 2021-09-30 DIAGNOSIS — R229 Localized swelling, mass and lump, unspecified: Secondary | ICD-10-CM | POA: Diagnosis not present

## 2021-09-30 DIAGNOSIS — G459 Transient cerebral ischemic attack, unspecified: Secondary | ICD-10-CM | POA: Diagnosis not present

## 2021-09-30 DIAGNOSIS — G500 Trigeminal neuralgia: Secondary | ICD-10-CM | POA: Insufficient documentation

## 2021-09-30 DIAGNOSIS — D181 Lymphangioma, any site: Secondary | ICD-10-CM | POA: Insufficient documentation

## 2021-09-30 NOTE — Progress Notes (Signed)
[facility] [place], [place] [address] [phone]   Interval Evaluation  Date of Service: 09/30/21 Patient Name: Hunter Chang Patient MRN: [id number] Patient DOB: 03/04/1946 Provider: [name], MD  Identifying Statement:  LONG BRIMAGE is a 76 y.o. male with Bilateral leg weakness   Primary Cancer: Skin fibroxanthoma  Interval History: AADAM ZHEN presents today for follow up after recent new neurologic syndrome.  He describes 4 episodes over the past 20 days.  Begins with weakness of both legs, left more than right.  Weakness is accompanied by "heavy" "band-like" sensation affecting both legs, again left more than right.  Weakness resolves after ~30 minutes at most.  Most recently he went to the ED fora  stroke workup.  Denies issues with gait, weakness, incontinence, neck pain between episodes.  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    H+P (04/19/21) Patient presents to review recent neurologic complaint.  He describes sharp, intense pain in the right side of his forehead and right cheek area.  Symptoms are brief and self limited, less than a minute, and they occur ~3-4x per week only.  They began after surgical removal of skin mass, fibroxanthoma, with bony invasion.  He thinks the syndrome began prior to radiation treatments.  Overall it is not getting worse or better, and interferes minimally with overall quality of life.  He has no other neurologic complaints, no history of headache or facial pain.    Medications: Current Outpatient Medications on File Prior to Visit  Medication Sig Dispense Refill   aspirin 81 MG EC tablet Take 81 mg by mouth daily. Swallow whole.     carbamazepine (TEGRETOL-XR) 200 MG 12 hr tablet Take 1 tablet (200 mg total) by mouth 2 (two) times daily. 60 tablet 1   magnesium gluconate (MAGONATE) 500 MG tablet Take by mouth.     Multiple Vitamins-Minerals  (ONE-A-DAY MENS 50+ ADVANTAGE) TABS Take 1 tablet by mouth daily.     Multiple Vitamins-Minerals (ZINC PO) Take 50 mg by mouth daily.     sildenafil (VIAGRA) 100 MG tablet Take 0.5-1 tablets (50-100 mg total) by mouth daily as needed for erectile dysfunction. 20 tablet 8   VITAMIN D PO Take 1 tablet by mouth daily.     No current facility-administered medications on file prior to visit.    Allergies:  Allergies  Allergen Reactions   Succinylcholine Chloride Other (See Comments)    "Paralyzed my diaphragm"   Past Medical History:  Past Medical History:  Diagnosis Date   Arthritis    Chicken pox    [place] measles    Past Surgical History:  Past Surgical History:  Procedure Laterality Date   HERNIA REPAIR     3 times   Social History:  Social History   Socioeconomic History   Marital status: Married    Spouse name: Not on file   Number of children: Not on file   Years of education: Not on file   Highest education level: Not on file  Occupational History   Not on file  Tobacco Use   Smoking status: Never   Smokeless tobacco: Never  Vaping Use   Vaping Use: Never used  Substance and Sexual Activity   Alcohol use: No   Drug use: No   Sexual activity: Yes  Other Topics Concern   Not on file  Social History Narrative   Not on  file   Social Determinants of Health   Financial Resource Strain: Low Risk  (12/12/2020)   Overall Financial Resource Strain (CARDIA)    Difficulty of Paying Living Expenses: Not hard at all  Food Insecurity: No Food Insecurity (12/12/2020)   Hunger Vital Sign    Worried About Running Out of Food in the Last Year: Never true    Ran Out of Food in the Last Year: Never true  Transportation Needs: No Transportation Needs (12/12/2020)   PRAPARE - Hydrologist (Medical): No    Lack of Transportation (Non-Medical): No  Physical Activity: Sufficiently Active (12/12/2020)   Exercise Vital Sign    Days of Exercise per  Week: 5 days    Minutes of Exercise per Session: 50 min  Stress: No Stress Concern Present (12/12/2020)   [place]    Feeling of Stress : Not at all  Social Connections: [place] (12/12/2020)   Social Connection and Isolation Panel [NHANES]    Frequency of Communication with Friends and Family: More than three times a week    Frequency of Social Gatherings with Friends and Family: More than three times a week    Attends Religious Services: More than 4 times per year    Active Member of [employer] or Organizations: Yes    Attends Music therapist: More than 4 times per year    Marital Status: Married  Human resources officer Violence: Not At Risk (12/12/2020)   Humiliation, Afraid, Rape, and Kick questionnaire    Fear of Current or Ex-Partner: No    Emotionally Abused: No    Physically Abused: No    Sexually Abused: No   Family History:  Family History  Problem Relation Age of Onset   Arthritis Mother    Diabetes Father    Early death Father    Heart disease Father    Heart attack Father    Early death Maternal Grandfather    COPD Paternal Grandfather     Review of Systems: Constitutional: Doesn't report fevers, chills or abnormal weight loss Eyes: Doesn't report blurriness of vision Ears, nose, mouth, throat, and face: Doesn't report sore throat Respiratory: Doesn't report cough, dyspnea or wheezes Cardiovascular: Doesn't report palpitation, chest discomfort  Gastrointestinal:  Doesn't report nausea, constipation, diarrhea GU: Doesn't report incontinence Skin: Doesn't report skin rashes Neurological: Per HPI Musculoskeletal: Doesn't report joint pain Behavioral/Psych: Doesn't report anxiety  Physical Exam: Vitals:   09/30/21 1406  BP: (!) 144/84  Pulse: 90  Resp: 16  Temp: 98.3 F (36.8 C)  SpO2: 96%   KPS: 90. General: Alert, cooperative, pleasant, in no acute distress Head:  Normal EENT: No conjunctival injection or scleral icterus.  Lungs: Resp effort normal Cardiac: Regular rate Abdomen: Non-distended abdomen Skin: Scalp lesion noted. Extremities: No clubbing or edema  Neurologic Exam: Mental Status: Awake, alert, attentive to examiner. Oriented to self and environment. Language is fluent with intact comprehension.  Cranial Nerves: Visual acuity is grossly normal. Visual fields are full. Extra-ocular movements intact. No ptosis. Face is symmetric Motor: Tone and bulk are normal. Power is full in both arms and legs. Reflexes are symmetric, no pathologic reflexes present.  Sensory: Intact to light touch Gait: Normal.   Labs: I have reviewed the data as listed    Component Value Date/Time   NA 130 (L) 09/29/2021 1116   K 4.4 09/29/2021 1116   CL 93 (L) 09/29/2021 1116   CO2  30 09/29/2021 1116   GLUCOSE 116 (H) 09/29/2021 1116   BUN 11 09/29/2021 1116   CREATININE 0.95 09/29/2021 1116   CALCIUM 9.8 09/29/2021 1116   PROT 7.6 09/29/2021 1116   ALBUMIN 4.1 09/29/2021 1116   AST 22 09/29/2021 1116   ALT 32 09/29/2021 1116   ALKPHOS 188 (H) 09/29/2021 1116   BILITOT 0.4 09/29/2021 1116   GFRNONAA >60 09/26/2021 0939   Lab Results  Component Value Date   WBC 7.2 09/26/2021   NEUTROABS 5.4 09/26/2021   HGB 14.8 09/26/2021   HCT 41.9 09/26/2021   MCV 85.7 09/26/2021   PLT 333 09/26/2021    Imaging:  MR BRAIN WO CONTRAST  Result Date: 09/26/2021 CLINICAL DATA:  TIA. Patient fell today. Patient states legs gave out and went numb. Numbness sensation lasted approximately 20 minutes. EXAM: MRI HEAD WITHOUT CONTRAST TECHNIQUE: Multiplanar, multiecho pulse sequences of the brain and surrounding structures were obtained without intravenous contrast. COMPARISON:  CT head without contrast 09/26/2021 FINDINGS: Brain: A thin extra-axial collection is present over the right convexity measuring up to 5 mm. No significant mass effect is associated. Moderate  atrophy and scattered white matter disease is present otherwise. No other acute intracranial abnormality is present. No acute infarct or hemorrhage is present. The ventricles are proportionate to the degree of atrophy. No other significant extra-axial fluid collection is present. The internal auditory canals are within normal limits. The brainstem and cerebellum are within normal limits. Vascular: Flow is present in the major intracranial arteries. Skull and upper cervical spine: The craniocervical junction is normal. Upper cervical spine is within normal limits. Marrow signal is unremarkable. Postoperative and radiation changes to the posterior frontal scalp and skull are noted. Sinuses/Orbits: The paranasal sinuses and mastoid air cells are clear. The globes and orbits are within normal limits. IMPRESSION: 1. Thin extra-axial fluid collection over the right convexity measuring up to 5 mm without significant mass effect. This likely represents a hygroma. Acuity is indeterminate. No mass effect is present. No blood products are associated. 2. No acute intracranial abnormality. 3. Moderate atrophy and scattered white matter disease is moderately advanced for age. This likely reflects the sequela of chronic microvascular ischemia. 4. Post treatment changes to the scalp and vertex over the skull, likely related to prior radiation. Electronically Signed   By: San Morelle M.D.   On: 09/26/2021 14:48   CT Head Wo Contrast  Result Date: 09/26/2021 CLINICAL DATA:  Acute onset left lower extremity weakness and numbness. Transient ischemic attack. EXAM: CT HEAD WITHOUT CONTRAST TECHNIQUE: Contiguous axial images were obtained from the base of the skull through the vertex without intravenous contrast. RADIATION DOSE REDUCTION: This exam was performed according to the departmental dose-optimization program which includes automated exposure control, adjustment of the mA and/or kV according to patient size and/or use  of iterative reconstruction technique. COMPARISON:  None Available. FINDINGS: Brain: No evidence of intracranial hemorrhage, acute infarction, hydrocephalus, or mass lesion/mass effect. A small chronic right frontal subdural hygroma is seen measuring approximately 5 mm in thickness, without significant mass effect. Postsurgical changes seen involving the high right parietal scalp and calvarium. Moderate chronic small vessel disease is also seen. Vascular:  No hyperdense vessel or other acute findings. Skull: No evidence of fracture or other significant bone abnormality. Sinuses/Orbits:  No acute findings. Other: None. IMPRESSION: No acute intracranial abnormality. 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 Electronically Signed   By: Marlaine Hind M.D.   On:  09/26/2021 10:19   MR FACE/TRIGEMINAL WO/W CM  Addendum Date: 09/21/2021   ADDENDUM REPORT: 09/21/2021 13:25 ADDENDUM: Voice recognition error: The fourth sentence of the first section should read "No tumor or mass lesion is present. " Electronically Signed   By: San Morelle M.D.   On: 09/21/2021 13:25   Result Date: 09/21/2021 CLINICAL DATA:  Refractory trigeminal neuralgia. EXAM: MRI FACE TRIGEMINAL WITHOUT AND WITH CONTRAST TECHNIQUE: Multiplanar, multi-echo pulse sequences of the face and surrounding structures, including thin-slice imaging of the trigeminal nerves, were acquired before and after intravenous contrast administration. CONTRAST:  7m GADAVIST GADOBUTROL 1 MMOL/ML IV SOLN COMPARISON:  None Available. FINDINGS: Limited intracranial/Trigeminal nerves: The visualized intracranial contents are within normal limits. Foramen ovale is within normal limits. The 3 nerves are well visualized and within normal limits. Tumor mass lesion is present. Brainstem is within normal limits. The trigeminal nerve root entry zone is normal bilaterally. Other visualized cranial nerves are also within normal  limits. No pathologic enhancement is present. Vascular: Flow is present in the major intracranial arteries. Sinuses/Orbits: The paranasal sinuses and mastoid air cells are clear. The globes and orbits are within normal limits. Soft tissues: Soft tissues the face are within normal limits. Osseous: Marrow signal is within normal limits. No pathologic enhancement is present. Other: Muscles of mastication are within normal limits. Submandibular and parotid glands and ducts are normal. IMPRESSION: Negative MRI of the face and trigeminal nerves without and with contrast. Electronically Signed: By: CSan MorelleM.D. On: 09/11/2021 14:20    Assessment/Plan Bilateral leg weakness  PTAYTE MCWHERTERpresents with clinical syndrome localizing to the cervical or thoracic spine.  Brain MRI demonstrated significant burden of white matter disease, in the absence of traditional vascular risk factors aside from age.  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  Hygroma appears old and does not localize to current syndrome.  Do not suspect (brain) stroke/TIA or epilepsy.  We recommended moving forward with imaging of cervical and thoracic spine, with contrast given concurrent malignancy and suspicion for auto-immune syndrome.  He is agreeabel with this plan.  Hyponatremia could be exacerbating factor, so we recommended decreasing Tegretol back down to '100mg'$  BID.  He may discontinue it if preferred.  We could always switch to Trileptal as well if needed for facial pain.  We appreciate the opportunity to participate in the care of PTEANDRE HAMRE    We will give him a call in ~1 week following MRI studies if we able to get them scheduled before then.  All questions were answered. The patient knows to call the clinic with any problems, questions or concerns. No barriers to learning were detected.  The total time spent in the encounter was 40 minutes and more than  50% was on counseling and review of test results   ZVentura Sellers MD Medical Director of Neuro-Oncology [facility]07/06/23 3:02 PM

## 2021-10-01 ENCOUNTER — Telehealth: Payer: Self-pay | Admitting: Internal Medicine

## 2021-10-01 ENCOUNTER — Other Ambulatory Visit: Payer: Self-pay

## 2021-10-01 DIAGNOSIS — R748 Abnormal levels of other serum enzymes: Secondary | ICD-10-CM

## 2021-10-01 DIAGNOSIS — E871 Hypo-osmolality and hyponatremia: Secondary | ICD-10-CM

## 2021-10-01 NOTE — Progress Notes (Signed)
Please call this morning and let him know that his sodium level is low at 130.  This means he is probably not getting enough salt and possibly too many fluids.  Cut back on water and other fluids slightly and increase sodium intake.  Please see how he is feeling, any more leg weakness?  I recommend that we recheck his sodium level on Monday.  He will need a CMP for hyponatremia and elevated alkaline phosphatase

## 2021-10-01 NOTE — Telephone Encounter (Signed)
Per 7/6 los called and left message for pt about phone visit.  Details and call back number were left

## 2021-10-01 NOTE — Progress Notes (Signed)
CMP ordered per Vickie Henson's request.

## 2021-10-01 NOTE — Addendum Note (Signed)
Addended by: Rossie Muskrat on: 10/01/2021 09:00 AM   Modules accepted: Orders

## 2021-10-03 ENCOUNTER — Encounter: Payer: Self-pay | Admitting: Family Medicine

## 2021-10-03 LAB — TESTOSTERONE, FREE & TOTAL
Free Testosterone: 22.9 pg/mL — ABNORMAL LOW (ref 30.0–135.0)
Testosterone, Total, LC-MS-MS: 267 ng/dL (ref 250–1100)

## 2021-10-03 LAB — ZINC: Zinc: 106 ug/dL (ref 60–130)

## 2021-10-04 ENCOUNTER — Other Ambulatory Visit: Payer: Self-pay | Admitting: Radiation Therapy

## 2021-10-04 ENCOUNTER — Other Ambulatory Visit (INDEPENDENT_AMBULATORY_CARE_PROVIDER_SITE_OTHER): Payer: PPO

## 2021-10-04 DIAGNOSIS — E871 Hypo-osmolality and hyponatremia: Secondary | ICD-10-CM | POA: Diagnosis not present

## 2021-10-04 DIAGNOSIS — R748 Abnormal levels of other serum enzymes: Secondary | ICD-10-CM

## 2021-10-04 LAB — COMPREHENSIVE METABOLIC PANEL
ALT: 41 U/L (ref 0–53)
AST: 26 U/L (ref 0–37)
Albumin: 4 g/dL (ref 3.5–5.2)
Alkaline Phosphatase: 170 U/L — ABNORMAL HIGH (ref 39–117)
BUN: 12 mg/dL (ref 6–23)
CO2: 30 mEq/L (ref 19–32)
Calcium: 9.9 mg/dL (ref 8.4–10.5)
Chloride: 99 mEq/L (ref 96–112)
Creatinine, Ser: 0.97 mg/dL (ref 0.40–1.50)
GFR: 76.5 mL/min (ref >60.00)
Glucose, Bld: 94 mg/dL (ref 70–99)
Potassium: 3.9 mEq/L (ref 3.5–5.1)
Sodium: 137 mEq/L (ref 135–145)
Total Bilirubin: 0.4 mg/dL (ref 0.2–1.2)
Total Protein: 7.7 g/dL (ref 6.0–8.3)

## 2021-10-04 NOTE — Telephone Encounter (Signed)
Pt had a chance to go over lab results and saw that his testosterone was a little low. He would like to know if there is anything he can do to help bring those numbers up?

## 2021-10-07 ENCOUNTER — Emergency Department (HOSPITAL_COMMUNITY): Payer: PPO

## 2021-10-07 ENCOUNTER — Inpatient Hospital Stay (HOSPITAL_COMMUNITY)
Admission: EM | Admit: 2021-10-07 | Discharge: 2021-10-12 | DRG: 862 | Payer: PPO | Attending: Internal Medicine | Admitting: Internal Medicine

## 2021-10-07 DIAGNOSIS — W010XXA Fall on same level from slipping, tripping and stumbling without subsequent striking against object, initial encounter: Secondary | ICD-10-CM | POA: Diagnosis present

## 2021-10-07 DIAGNOSIS — R531 Weakness: Secondary | ICD-10-CM | POA: Diagnosis present

## 2021-10-07 DIAGNOSIS — Y92002 Bathroom of unspecified non-institutional (private) residence single-family (private) house as the place of occurrence of the external cause: Secondary | ICD-10-CM | POA: Diagnosis not present

## 2021-10-07 DIAGNOSIS — E876 Hypokalemia: Secondary | ICD-10-CM | POA: Diagnosis present

## 2021-10-07 DIAGNOSIS — G9341 Metabolic encephalopathy: Secondary | ICD-10-CM | POA: Diagnosis present

## 2021-10-07 DIAGNOSIS — I6201 Nontraumatic acute subdural hemorrhage: Secondary | ICD-10-CM | POA: Diagnosis present

## 2021-10-07 DIAGNOSIS — I6503 Occlusion and stenosis of bilateral vertebral arteries: Secondary | ICD-10-CM | POA: Diagnosis not present

## 2021-10-07 DIAGNOSIS — Z888 Allergy status to other drugs, medicaments and biological substances status: Secondary | ICD-10-CM | POA: Diagnosis not present

## 2021-10-07 DIAGNOSIS — Z8249 Family history of ischemic heart disease and other diseases of the circulatory system: Secondary | ICD-10-CM | POA: Diagnosis not present

## 2021-10-07 DIAGNOSIS — S065XAA Traumatic subdural hemorrhage with loss of consciousness status unknown, initial encounter: Secondary | ICD-10-CM | POA: Diagnosis present

## 2021-10-07 DIAGNOSIS — Y842 Radiological procedure and radiotherapy as the cause of abnormal reaction of the patient, or of later complication, without mention of misadventure at the time of the procedure: Secondary | ICD-10-CM | POA: Diagnosis present

## 2021-10-07 DIAGNOSIS — Z7982 Long term (current) use of aspirin: Secondary | ICD-10-CM

## 2021-10-07 DIAGNOSIS — Z8261 Family history of arthritis: Secondary | ICD-10-CM | POA: Diagnosis not present

## 2021-10-07 DIAGNOSIS — R29818 Other symptoms and signs involving the nervous system: Secondary | ICD-10-CM | POA: Diagnosis not present

## 2021-10-07 DIAGNOSIS — I672 Cerebral atherosclerosis: Secondary | ICD-10-CM | POA: Diagnosis not present

## 2021-10-07 DIAGNOSIS — I700 Atherosclerosis of aorta: Secondary | ICD-10-CM | POA: Diagnosis not present

## 2021-10-07 DIAGNOSIS — I6523 Occlusion and stenosis of bilateral carotid arteries: Secondary | ICD-10-CM | POA: Diagnosis not present

## 2021-10-07 DIAGNOSIS — Z833 Family history of diabetes mellitus: Secondary | ICD-10-CM

## 2021-10-07 DIAGNOSIS — R569 Unspecified convulsions: Secondary | ICD-10-CM

## 2021-10-07 DIAGNOSIS — C490 Malignant neoplasm of connective and soft tissue of head, face and neck: Secondary | ICD-10-CM | POA: Diagnosis not present

## 2021-10-07 DIAGNOSIS — G9340 Encephalopathy, unspecified: Secondary | ICD-10-CM | POA: Diagnosis not present

## 2021-10-07 DIAGNOSIS — G500 Trigeminal neuralgia: Secondary | ICD-10-CM | POA: Diagnosis present

## 2021-10-07 DIAGNOSIS — Z825 Family history of asthma and other chronic lower respiratory diseases: Secondary | ICD-10-CM

## 2021-10-07 DIAGNOSIS — I6203 Nontraumatic chronic subdural hemorrhage: Secondary | ICD-10-CM | POA: Diagnosis present

## 2021-10-07 DIAGNOSIS — D485 Neoplasm of uncertain behavior of skin: Secondary | ICD-10-CM | POA: Diagnosis present

## 2021-10-07 DIAGNOSIS — T8141XA Infection following a procedure, superficial incisional surgical site, initial encounter: Secondary | ICD-10-CM | POA: Diagnosis present

## 2021-10-07 DIAGNOSIS — R000 Tachycardia, unspecified: Secondary | ICD-10-CM | POA: Diagnosis not present

## 2021-10-07 DIAGNOSIS — Z8619 Personal history of other infectious and parasitic diseases: Secondary | ICD-10-CM

## 2021-10-07 DIAGNOSIS — S061XAA Traumatic cerebral edema with loss of consciousness status unknown, initial encounter: Secondary | ICD-10-CM | POA: Diagnosis present

## 2021-10-07 DIAGNOSIS — I6200 Nontraumatic subdural hemorrhage, unspecified: Principal | ICD-10-CM

## 2021-10-07 DIAGNOSIS — D181 Lymphangioma, any site: Secondary | ICD-10-CM | POA: Diagnosis present

## 2021-10-07 DIAGNOSIS — Z85828 Personal history of other malignant neoplasm of skin: Secondary | ICD-10-CM

## 2021-10-07 DIAGNOSIS — Z79899 Other long term (current) drug therapy: Secondary | ICD-10-CM

## 2021-10-07 DIAGNOSIS — Z9889 Other specified postprocedural states: Secondary | ICD-10-CM | POA: Diagnosis not present

## 2021-10-07 DIAGNOSIS — Z923 Personal history of irradiation: Secondary | ICD-10-CM | POA: Diagnosis not present

## 2021-10-07 DIAGNOSIS — Z20822 Contact with and (suspected) exposure to covid-19: Secondary | ICD-10-CM | POA: Diagnosis present

## 2021-10-07 DIAGNOSIS — I10 Essential (primary) hypertension: Secondary | ICD-10-CM | POA: Diagnosis not present

## 2021-10-07 DIAGNOSIS — R29898 Other symptoms and signs involving the musculoskeletal system: Secondary | ICD-10-CM

## 2021-10-07 DIAGNOSIS — Z8669 Personal history of other diseases of the nervous system and sense organs: Secondary | ICD-10-CM | POA: Diagnosis not present

## 2021-10-07 HISTORY — DX: Trigeminal neuralgia: G50.0

## 2021-10-07 LAB — I-STAT CHEM 8, ED
BUN: 18 mg/dL (ref 8–23)
Calcium, Ion: 1.16 mmol/L (ref 1.15–1.40)
Chloride: 102 mmol/L (ref 98–111)
Creatinine, Ser: 1.1 mg/dL (ref 0.61–1.24)
Glucose, Bld: 154 mg/dL — ABNORMAL HIGH (ref 70–99)
HCT: 41 % (ref 39.0–52.0)
Hemoglobin: 13.9 g/dL (ref 13.0–17.0)
Potassium: 3.6 mmol/L (ref 3.5–5.1)
Sodium: 137 mmol/L (ref 135–145)
TCO2: 26 mmol/L (ref 22–32)

## 2021-10-07 LAB — CBC
HCT: 40.7 % (ref 39.0–52.0)
Hemoglobin: 13.9 g/dL (ref 13.0–17.0)
MCH: 29.8 pg (ref 26.0–34.0)
MCHC: 34.2 g/dL (ref 30.0–36.0)
MCV: 87.3 fL (ref 80.0–100.0)
Platelets: 350 10*3/uL (ref 150–400)
RBC: 4.66 MIL/uL (ref 4.22–5.81)
RDW: 12.1 % (ref 11.5–15.5)
WBC: 8.3 10*3/uL (ref 4.0–10.5)
nRBC: 0 % (ref 0.0–0.2)

## 2021-10-07 LAB — DIFFERENTIAL
Abs Immature Granulocytes: 0.03 10*3/uL (ref 0.00–0.07)
Basophils Absolute: 0.1 10*3/uL (ref 0.0–0.1)
Basophils Relative: 1 %
Eosinophils Absolute: 0.1 10*3/uL (ref 0.0–0.5)
Eosinophils Relative: 2 %
Immature Granulocytes: 0 %
Lymphocytes Relative: 19 %
Lymphs Abs: 1.6 10*3/uL (ref 0.7–4.0)
Monocytes Absolute: 0.7 10*3/uL (ref 0.1–1.0)
Monocytes Relative: 9 %
Neutro Abs: 5.8 10*3/uL (ref 1.7–7.7)
Neutrophils Relative %: 69 %

## 2021-10-07 LAB — COMPREHENSIVE METABOLIC PANEL
ALT: 36 U/L (ref 0–44)
AST: 24 U/L (ref 15–41)
Albumin: 3.6 g/dL (ref 3.5–5.0)
Alkaline Phosphatase: 163 U/L — ABNORMAL HIGH (ref 38–126)
Anion gap: 12 (ref 5–15)
BUN: 17 mg/dL (ref 8–23)
CO2: 24 mmol/L (ref 22–32)
Calcium: 9.7 mg/dL (ref 8.9–10.3)
Chloride: 101 mmol/L (ref 98–111)
Creatinine, Ser: 1.07 mg/dL (ref 0.61–1.24)
GFR, Estimated: >60 mL/min (ref >60)
Glucose, Bld: 158 mg/dL — ABNORMAL HIGH (ref 70–99)
Potassium: 3.6 mmol/L (ref 3.5–5.1)
Sodium: 137 mmol/L (ref 135–145)
Total Bilirubin: 0.3 mg/dL (ref 0.3–1.2)
Total Protein: 7.3 g/dL (ref 6.5–8.1)

## 2021-10-07 LAB — RESP PANEL BY RT-PCR (FLU A&B, COVID) ARPGX2
Influenza A by PCR: NEGATIVE
Influenza B by PCR: NEGATIVE
SARS Coronavirus 2 by RT PCR: NEGATIVE

## 2021-10-07 LAB — ETHANOL: Alcohol, Ethyl (B): <10 mg/dL (ref <10)

## 2021-10-07 LAB — CBG MONITORING, ED: Glucose-Capillary: 157 mg/dL — ABNORMAL HIGH (ref 70–99)

## 2021-10-07 LAB — PROTIME-INR
INR: 1 (ref 0.8–1.2)
Prothrombin Time: 13.2 seconds (ref 11.4–15.2)

## 2021-10-07 LAB — APTT: aPTT: 28 seconds (ref 24–36)

## 2021-10-07 MED ORDER — LORAZEPAM 2 MG/ML IJ SOLN
INTRAMUSCULAR | Status: AC
  Administered 2021-10-07: 2 mg via INTRAVENOUS
  Filled 2021-10-07: qty 1

## 2021-10-07 MED ORDER — LORAZEPAM 2 MG/ML IJ SOLN: 2.0000 mg | Freq: Once | INTRAMUSCULAR | Status: AC

## 2021-10-07 MED ORDER — LEVETIRACETAM IN NACL 1000 MG/100ML IV SOLN
2000.0000 mg | Freq: Once | INTRAVENOUS | Status: AC
  Administered 2021-10-07: 2000 mg via INTRAVENOUS

## 2021-10-07 MED ORDER — IOHEXOL 350 MG/ML SOLN
65.0000 mL | Freq: Once | INTRAVENOUS | Status: AC | PRN
  Administered 2021-10-07: 65 mL via INTRAVENOUS

## 2021-10-07 MED ORDER — LEVETIRACETAM IN NACL 1000 MG/100ML IV SOLN: 2000.0000 mg | Freq: Two times a day (BID) | INTRAVENOUS | Status: DC

## 2021-10-07 MED ORDER — ACETAMINOPHEN 325 MG PO TABS
650.0000 mg | ORAL_TABLET | Freq: Four times a day (QID) | ORAL | Status: DC | PRN
  Administered 2021-10-12: 650 mg via ORAL
  Filled 2021-10-07: qty 2

## 2021-10-07 MED ORDER — STROKE: EARLY STAGES OF RECOVERY BOOK
Freq: Once | Status: AC
  Filled 2021-10-07: qty 1

## 2021-10-07 MED ORDER — LORAZEPAM 2 MG/ML IJ SOLN
INTRAMUSCULAR | Status: AC
  Administered 2021-10-07: 1 mg via INTRAVENOUS
  Filled 2021-10-07: qty 1

## 2021-10-07 MED ORDER — LORAZEPAM 2 MG/ML IJ SOLN: 1.0000 mg | Freq: Once | INTRAMUSCULAR | Status: AC

## 2021-10-07 MED ORDER — ACETAMINOPHEN 650 MG RE SUPP: 650.0000 mg | Freq: Four times a day (QID) | RECTAL | Status: DC | PRN

## 2021-10-07 NOTE — ED Triage Notes (Signed)
Pt BIB [facility] from home as a code stroke. LKN 2030, was watching tv in recliner, noticed L side numbness, got up to go to the bathroom and noticed abnormal gait, was able to urinate and then collapsed to L side, no LOC, did not hit head. Decreased sensation on L side per EMS, axo x4. L leg tremors noted upon arrival, no hx of seizures.

## 2021-10-07 NOTE — Code Documentation (Incomplete)
Stroke Response Nurse Documentation Code Documentation  Hunter Chang is a 76 y.o. male arriving to [facility]  via [facility] on 7/13 with past medical hx of bilateral leg weakness, Trigeminal neuralgia. On aspirin 81 mg daily. Code stroke was activated by EMS.   Patient from home where he was LKW at 2030 and now complaining of left sided weakness.   Stroke team at the bedside on patient arrival. Labs drawn and patient cleared for CT by Dr. Darl Householder. Patient to CT with team. NIHSS 3, see documentation for details and code stroke times. Patient with left arm weakness, left leg weakness, and left decreased sensation on exam. The following imaging was completed:  CT Head and CTA. Patient is not a candidate for IV Thrombolytic due to ***. Patient {ACTION; IS/IS TMA:[id number]} a candidate for IR due to ***.   Care Plan: ***.   Bedside handoff with ED RN ***.    Madelynn Done  Rapid Response RN

## 2021-10-07 NOTE — ED Provider Notes (Signed)
[place] EMERGENCY DEPARTMENT Provider Note   CSN: [id number] Arrival date & time: 10/07/21  2124     History  Chief Complaint  Patient presents with   Code Stroke    Hunter Chang is a 76 y.o. 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  Patient was with his wife and was watching TV and went to the bathroom around 8:30 PM.  He then called his wife and was unable to get up. He states that he had left-sided weakness.  He also noticed tremors in the left leg.  No history of seizures.  Patient was on Tegretol for trigeminal neuralgia and was discontinued 5 days ago.  The history is provided by the patient, the EMS personnel and the spouse.       Home Medications Prior to Admission medications   Medication Sig Start Date End Date Taking? Authorizing Provider  aspirin 81 MG EC tablet Take 81 mg by mouth daily. Swallow whole.    [provider]  carbamazepine (TEGRETOL-XR) 200 MG 12 hr tablet Take 1 tablet (200 mg total) by mouth 2 (two) times daily. 09/02/21   [name], MD  magnesium gluconate (MAGONATE) 500 MG tablet Take by mouth.    [provider]  Multiple Vitamins-Minerals (ONE-A-DAY MENS 50+ ADVANTAGE) TABS Take 1 tablet by mouth daily.    [provider]  Multiple Vitamins-Minerals (ZINC PO) Take 50 mg by mouth daily.    [provider]  sildenafil (VIAGRA) 100 MG tablet Take 0.5-1 tablets (50-100 mg total) by mouth daily as needed for erectile dysfunction. 04/28/21   Binnie Rail, MD  VITAMIN D PO Take 1 tablet by mouth daily.    [provider]      Allergies    Succinylcholine chloride    Review of Systems   Review of Systems  Neurological:  Positive for seizures.  All other systems reviewed and are negative.   Physical Exam Updated Vital Signs BP 135/71   Pulse (!) 102   Temp 97.7 F (36.5 C) (Oral)   Resp (!) 25    Wt 83.2 kg   SpO2 96%   BMI 27.89 kg/m  Physical Exam Vitals and nursing note reviewed.  Constitutional:      Comments: Ill-appearing  HENT:     Head: Normocephalic.     Nose: Nose normal.     Mouth/Throat:     Mouth: Mucous membranes are moist.  Eyes:     Extraocular Movements: Extraocular movements intact.     Pupils: Pupils are equal, round, and reactive to light.  Cardiovascular:     Rate and Rhythm: Normal rate and regular rhythm.     Pulses: Normal pulses.     Heart sounds: Normal heart sounds.  Pulmonary:     Effort: Pulmonary effort is normal.     Breath sounds: Normal breath sounds.  Abdominal:     General: Abdomen is flat.     Palpations: Abdomen is soft.  Musculoskeletal:        General: Normal range of motion.     Cervical back: Normal range of motion and neck supple.  Skin:    General: Skin is warm.  Neurological:     Mental Status: He is alert.     Comments: Left arm 4 out of 5 strength and there is tremors in the left leg.  No obvious facial droop  Psychiatric:  Mood and Affect: Mood normal.        Behavior: Behavior normal.     ED Results / Procedures / Treatments   Labs (all labs ordered are listed, but only abnormal results are displayed) Labs Reviewed  COMPREHENSIVE METABOLIC PANEL - Abnormal; Notable for the following components:      Result Value   Glucose, Bld 158 (*)    Alkaline Phosphatase 163 (*)    All other components within normal limits  CBG MONITORING, ED - Abnormal; Notable for the following components:   Glucose-Capillary 157 (*)    All other components within normal limits  RESP PANEL BY RT-PCR (FLU A&B, COVID) ARPGX2  ETHANOL  PROTIME-INR  APTT  CBC  DIFFERENTIAL  RAPID URINE DRUG SCREEN, HOSP PERFORMED  URINALYSIS, ROUTINE W REFLEX MICROSCOPIC  I-STAT CHEM 8, ED    EKG None  Radiology CT HEAD CODE STROKE WO CONTRAST  Result Date: 10/07/2021 CLINICAL DATA:  Code stroke. Initial evaluation for neuro deficit,  stroke suspected. EXAM: CT HEAD WITHOUT CONTRAST TECHNIQUE: Contiguous axial images were obtained from the base of the skull through the vertex without intravenous contrast. RADIATION DOSE REDUCTION: This exam was performed according to the departmental dose-optimization program which includes automated exposure control, adjustment of the mA and/or kV according to patient size and/or use of iterative reconstruction technique. COMPARISON:  Prior MRI and CT from 09/26/2021. FINDINGS: Brain: Cerebral volume within normal limits. 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 There is increased hypodensity at the subcortical right parietal region, new from prior exam, with appearance more typical of vasogenic edema. Finding raises the possibility for an underlying lesion or possibly PRES. No visible acute large vessel territory infarct. No other visible mass lesion. 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 Associated trace 2 mm right-to-left shift. Basilar cisterns remain patent. No hydrocephalus or trapping. No other acute intracranial hemorrhage. Vascular: No abnormal hyperdense vessel. Skull: Prior craniotomy defect noted at the right calvarium. Calvarium otherwise intact. No acute scalp soft tissue abnormality. Sinuses/Orbits: Globes and orbital soft tissues demonstrate no acute finding. Paranasal sinuses are clear. Mastoid air cells are clear. Other: None. ASPECTS [facility] Stroke Program Early CT Score) - Ganglionic level infarction (caudate, lentiform nuclei, internal capsule, insula, M1-M3 cortex): 7 - Supraganglionic infarction (M4-M6 cortex): 3 Total score (0-10 with 10 being normal): 10 IMPRESSION: 1. No definite acute ischemic changes. 2. ASPECTS is 10. 3. 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 4. Increased hypodensity involving the subcortical right parietal lobe, new/progressed as compared to 09/26/2021. Finding is indeterminate, but raises the possibility for an underlying lesion. Possible PRES could also be considered. Further evaluation with dedicated brain MRI suggested for further evaluation. These results were communicated to Dr. Leonel Ramsay at 9:47 pm on 10/07/2021 by text page via the [facility] messaging system. Results were also discussed by telephone at the time of interpretation on 10/07/2021 at 9:53 pm to provider Dr. Leonel Ramsay. Electronically Signed   By: Jeannine Boga M.D.   On: 10/07/2021 21:54    Procedures Procedures    Medications Ordered in ED Medications  levETIRAcetam (KEPPRA) IVPB 1000 mg/100 mL premix (2,000 mg Intravenous New Bag/Given 10/07/21 2204)  LORazepam (ATIVAN) injection 2 mg (2 mg Intravenous Given 10/07/21 2127)  LORazepam (ATIVAN) injection 1 mg (1 mg Intravenous Given 10/07/21 2137)  iohexol (  OMNIPAQUE) 350 MG/ML injection 65 mL (65 mLs Intravenous Contrast Given 10/07/21 2156)    ED Course/ Medical Decision Making/ A&P                           Medical Decision Making Hunter Chang is a 76 y.o. male here presenting with left arm and leg weakness as well as tremors in the left leg.  Concerned for possible stroke versus seizure.  Per the wife, patient did not hit his head.  Patient does have a history of hygroma on the right side.  Concern for possible subdural hemorrhage as well.  Plan to get CT head and stroke labs  10:28 PM I reviewed patient's labs and independently reviewed imaging studies.  CT showed acute on chronic subdural hemorrhage.  CTA is pending.  Dr. Leonel Ramsay already ordered IV Keppra. I consulted Dr. Ellene Route from neurosurgery.  He states that he will see patient in the morning.  Patient is very sedated after given IV Ativan.  At this point, hospitalist to admit for seizure, subdural hemorrhage   Problems  Addressed: Seizure [facility]): acute illness or injury Subdural hemorrhage [facility]): acute illness or injury  Amount and/or Complexity of Data Reviewed Labs: ordered. Decision-making details documented in ED Course. Radiology: ordered and independent interpretation performed. Decision-making details documented in ED Course. ECG/medicine tests: ordered and independent interpretation performed. Decision-making details documented in ED Course.  Risk Prescription drug management. Decision regarding hospitalization.    Final Clinical Impression(s) / ED Diagnoses Final diagnoses:  None    Rx / DC Orders ED Discharge Orders     None         Drenda Freeze, MD 10/07/21 2247

## 2021-10-08 ENCOUNTER — Observation Stay (HOSPITAL_COMMUNITY): Payer: PPO

## 2021-10-08 ENCOUNTER — Encounter (HOSPITAL_COMMUNITY): Payer: Self-pay | Admitting: Internal Medicine

## 2021-10-08 ENCOUNTER — Telehealth: Payer: Self-pay | Admitting: *Deleted

## 2021-10-08 ENCOUNTER — Other Ambulatory Visit: Payer: Self-pay

## 2021-10-08 DIAGNOSIS — W010XXA Fall on same level from slipping, tripping and stumbling without subsequent striking against object, initial encounter: Secondary | ICD-10-CM | POA: Diagnosis present

## 2021-10-08 DIAGNOSIS — Y842 Radiological procedure and radiotherapy as the cause of abnormal reaction of the patient, or of later complication, without mention of misadventure at the time of the procedure: Secondary | ICD-10-CM | POA: Diagnosis present

## 2021-10-08 DIAGNOSIS — I700 Atherosclerosis of aorta: Secondary | ICD-10-CM | POA: Diagnosis not present

## 2021-10-08 DIAGNOSIS — D181 Lymphangioma, any site: Secondary | ICD-10-CM | POA: Diagnosis present

## 2021-10-08 DIAGNOSIS — Z833 Family history of diabetes mellitus: Secondary | ICD-10-CM | POA: Diagnosis not present

## 2021-10-08 DIAGNOSIS — D485 Neoplasm of uncertain behavior of skin: Secondary | ICD-10-CM | POA: Diagnosis present

## 2021-10-08 DIAGNOSIS — R531 Weakness: Secondary | ICD-10-CM | POA: Diagnosis present

## 2021-10-08 DIAGNOSIS — Z8619 Personal history of other infectious and parasitic diseases: Secondary | ICD-10-CM | POA: Diagnosis not present

## 2021-10-08 DIAGNOSIS — S065XAA Traumatic subdural hemorrhage with loss of consciousness status unknown, initial encounter: Secondary | ICD-10-CM | POA: Diagnosis present

## 2021-10-08 DIAGNOSIS — Z7982 Long term (current) use of aspirin: Secondary | ICD-10-CM | POA: Diagnosis not present

## 2021-10-08 DIAGNOSIS — Z825 Family history of asthma and other chronic lower respiratory diseases: Secondary | ICD-10-CM | POA: Diagnosis not present

## 2021-10-08 DIAGNOSIS — I6201 Nontraumatic acute subdural hemorrhage: Secondary | ICD-10-CM | POA: Diagnosis present

## 2021-10-08 DIAGNOSIS — S061XAA Traumatic cerebral edema with loss of consciousness status unknown, initial encounter: Secondary | ICD-10-CM | POA: Diagnosis present

## 2021-10-08 DIAGNOSIS — Z923 Personal history of irradiation: Secondary | ICD-10-CM | POA: Diagnosis not present

## 2021-10-08 DIAGNOSIS — Z8249 Family history of ischemic heart disease and other diseases of the circulatory system: Secondary | ICD-10-CM | POA: Diagnosis not present

## 2021-10-08 DIAGNOSIS — E876 Hypokalemia: Secondary | ICD-10-CM | POA: Diagnosis present

## 2021-10-08 DIAGNOSIS — Z20822 Contact with and (suspected) exposure to covid-19: Secondary | ICD-10-CM | POA: Diagnosis present

## 2021-10-08 DIAGNOSIS — Z8261 Family history of arthritis: Secondary | ICD-10-CM | POA: Diagnosis not present

## 2021-10-08 DIAGNOSIS — Z85828 Personal history of other malignant neoplasm of skin: Secondary | ICD-10-CM | POA: Diagnosis not present

## 2021-10-08 DIAGNOSIS — Z8669 Personal history of other diseases of the nervous system and sense organs: Secondary | ICD-10-CM | POA: Diagnosis not present

## 2021-10-08 DIAGNOSIS — Z79899 Other long term (current) drug therapy: Secondary | ICD-10-CM | POA: Diagnosis not present

## 2021-10-08 DIAGNOSIS — G9341 Metabolic encephalopathy: Secondary | ICD-10-CM | POA: Diagnosis present

## 2021-10-08 DIAGNOSIS — G500 Trigeminal neuralgia: Secondary | ICD-10-CM | POA: Diagnosis present

## 2021-10-08 DIAGNOSIS — G9340 Encephalopathy, unspecified: Secondary | ICD-10-CM | POA: Diagnosis present

## 2021-10-08 DIAGNOSIS — T8141XA Infection following a procedure, superficial incisional surgical site, initial encounter: Secondary | ICD-10-CM | POA: Diagnosis present

## 2021-10-08 DIAGNOSIS — Y92002 Bathroom of unspecified non-institutional (private) residence single-family (private) house as the place of occurrence of the external cause: Secondary | ICD-10-CM | POA: Diagnosis not present

## 2021-10-08 DIAGNOSIS — R569 Unspecified convulsions: Secondary | ICD-10-CM | POA: Diagnosis present

## 2021-10-08 DIAGNOSIS — Z888 Allergy status to other drugs, medicaments and biological substances status: Secondary | ICD-10-CM | POA: Diagnosis not present

## 2021-10-08 DIAGNOSIS — I6203 Nontraumatic chronic subdural hemorrhage: Secondary | ICD-10-CM | POA: Diagnosis present

## 2021-10-08 LAB — RAPID URINE DRUG SCREEN, HOSP PERFORMED
Amphetamines: NOT DETECTED
Barbiturates: NOT DETECTED
Benzodiazepines: NOT DETECTED
Cocaine: NOT DETECTED
Opiates: NOT DETECTED
Tetrahydrocannabinol: NOT DETECTED

## 2021-10-08 LAB — CBC WITH DIFFERENTIAL/PLATELET
Abs Immature Granulocytes: 0.04 10*3/uL (ref 0.00–0.07)
Basophils Absolute: 0.1 10*3/uL (ref 0.0–0.1)
Basophils Relative: 1 %
Eosinophils Absolute: 0.2 10*3/uL (ref 0.0–0.5)
Eosinophils Relative: 2 %
HCT: 36.6 % — ABNORMAL LOW (ref 39.0–52.0)
Hemoglobin: 12.7 g/dL — ABNORMAL LOW (ref 13.0–17.0)
Immature Granulocytes: 1 %
Lymphocytes Relative: 20 %
Lymphs Abs: 1.5 10*3/uL (ref 0.7–4.0)
MCH: 29.8 pg (ref 26.0–34.0)
MCHC: 34.7 g/dL (ref 30.0–36.0)
MCV: 85.9 fL (ref 80.0–100.0)
Monocytes Absolute: 0.9 10*3/uL (ref 0.1–1.0)
Monocytes Relative: 12 %
Neutro Abs: 4.8 10*3/uL (ref 1.7–7.7)
Neutrophils Relative %: 64 %
Platelets: 283 10*3/uL (ref 150–400)
RBC: 4.26 MIL/uL (ref 4.22–5.81)
RDW: 12 % (ref 11.5–15.5)
WBC: 7.4 10*3/uL (ref 4.0–10.5)
nRBC: 0 % (ref 0.0–0.2)

## 2021-10-08 LAB — URINALYSIS, ROUTINE W REFLEX MICROSCOPIC
Bilirubin Urine: NEGATIVE
Glucose, UA: NEGATIVE mg/dL
Hgb urine dipstick: NEGATIVE
Ketones, ur: NEGATIVE mg/dL
Leukocytes,Ua: NEGATIVE
Nitrite: NEGATIVE
Protein, ur: NEGATIVE mg/dL
Specific Gravity, Urine: >1.046 — ABNORMAL HIGH (ref 1.005–1.030)
pH: 5 (ref 5.0–8.0)

## 2021-10-08 LAB — COMPREHENSIVE METABOLIC PANEL
ALT: 30 U/L (ref 0–44)
AST: 20 U/L (ref 15–41)
Albumin: 3 g/dL — ABNORMAL LOW (ref 3.5–5.0)
Alkaline Phosphatase: 134 U/L — ABNORMAL HIGH (ref 38–126)
Anion gap: 10 (ref 5–15)
BUN: 13 mg/dL (ref 8–23)
CO2: 26 mmol/L (ref 22–32)
Calcium: 8.9 mg/dL (ref 8.9–10.3)
Chloride: 103 mmol/L (ref 98–111)
Creatinine, Ser: 0.86 mg/dL (ref 0.61–1.24)
GFR, Estimated: >60 mL/min (ref >60)
Glucose, Bld: 105 mg/dL — ABNORMAL HIGH (ref 70–99)
Potassium: 3.4 mmol/L — ABNORMAL LOW (ref 3.5–5.1)
Sodium: 139 mmol/L (ref 135–145)
Total Bilirubin: 0.4 mg/dL (ref 0.3–1.2)
Total Protein: 6.5 g/dL (ref 6.5–8.1)

## 2021-10-08 LAB — MAGNESIUM
Magnesium: 2 mg/dL (ref 1.7–2.4)
Magnesium: 2.1 mg/dL (ref 1.7–2.4)

## 2021-10-08 MED ORDER — DEXAMETHASONE SODIUM PHOSPHATE 10 MG/ML IJ SOLN
10.0000 mg | Freq: Once | INTRAMUSCULAR | Status: AC
  Administered 2021-10-08: 10 mg via INTRAVENOUS
  Filled 2021-10-08: qty 1

## 2021-10-08 MED ORDER — POTASSIUM CHLORIDE CRYS ER 20 MEQ PO TBCR
40.0000 meq | EXTENDED_RELEASE_TABLET | Freq: Once | ORAL | Status: AC
Start: 2021-10-08 — End: 2021-10-08
  Administered 2021-10-08: 40 meq via ORAL
  Filled 2021-10-08: qty 2

## 2021-10-08 MED ORDER — ORAL CARE MOUTH RINSE: 15.0000 mL | OROMUCOSAL | Status: DC | PRN

## 2021-10-08 MED ORDER — GADOBUTROL 1 MMOL/ML IV SOLN
8.0000 mL | Freq: Once | INTRAVENOUS | Status: AC | PRN
  Administered 2021-10-08: 8 mL via INTRAVENOUS

## 2021-10-08 MED ORDER — DEXAMETHASONE SODIUM PHOSPHATE 4 MG/ML IJ SOLN
4.0000 mg | Freq: Four times a day (QID) | INTRAMUSCULAR | Status: DC
  Administered 2021-10-08 – 2021-10-11 (×10): 4 mg via INTRAVENOUS
  Filled 2021-10-08 (×10): qty 1

## 2021-10-08 MED ORDER — LEVETIRACETAM IN NACL 500 MG/100ML IV SOLN
500.0000 mg | Freq: Two times a day (BID) | INTRAVENOUS | Status: DC
  Administered 2021-10-08 – 2021-10-11 (×7): 500 mg via INTRAVENOUS
  Filled 2021-10-08 (×7): qty 100

## 2021-10-08 NOTE — Progress Notes (Addendum)
EEG complete - results pending 

## 2021-10-08 NOTE — Progress Notes (Signed)
  Transition of Care [facility]) Screening Note   Patient Details  Name: Hunter Chang Date of Birth: 1945-10-21   [facility] CM/SW Contact:    Pollie Friar, RN Phone Number: 10/08/2021, 2:59 PM   Pt is from home with his spouse. Recommendations are for CIR.  Transition of Care Department [facility]) has reviewed patient. We will continue to monitor patient advancement through interdisciplinary progression rounds. If new patient transition needs arise, please place a TOC consult.

## 2021-10-08 NOTE — ED Notes (Signed)
Patient transported to MRI 

## 2021-10-08 NOTE — Progress Notes (Addendum)
Neurology Progress Note   S:// Seen and examined.  No seizures overnight   O:// Current vital signs: BP 117/73   Pulse 83   Temp 97.7 F (36.5 C) (Oral)   Resp 17   Ht '5\' 8"'$  (1.727 m)   Wt 79.4 kg   SpO2 95%   BMI 26.61 kg/m  Vital signs in last 24 hours: Temp:  [97.7 F (36.5 C)] 97.7 F (36.5 C) (07/13 2206) Pulse Rate:  [66-102] 83 (07/14 1200) Resp:  [11-25] 17 (07/14 1200) BP: (94-135)/(61-86) 117/73 (07/14 1200) SpO2:  [93 %-98 %] 95 % (07/14 1200) Weight:  [79.4 kg-83.2 kg] 79.4 kg (07/14 0111) Well-developed but nourished Regular rate rhythm on cardiac exam Breathing well saturating normally on room air on respiratory exam Abdomen nondistended nontender Extremities warm well perfused HEENT: Large right parietal scalp defect with likely postradiation changes. Neurological exam Awake alert oriented x3, no aphasia, no dysarthria, cranial nerves II to XII intact, motor examination with maybe subtle left-sided weakness but no drift, sensation intact, no dysmetria, intact reflexes.  Medications  Current Facility-Administered Medications:    acetaminophen (TYLENOL) tablet 650 mg, 650 mg, Oral, Q6H PRN **OR** acetaminophen (TYLENOL) suppository 650 mg, 650 mg, Rectal, Q6H PRN, Howerter, Justin B, DO   levETIRAcetam (KEPPRA) IVPB 500 mg/100 mL premix, 500 mg, Intravenous, Q12H, Greta Doom, MD, Stopped at 10/08/21 0605   potassium chloride SA (KLOR-CON M) CR tablet 40 mEq, 40 mEq, Oral, Once, Darliss Cheney, MD Labs CBC    Component Value Date/Time   WBC 7.4 10/08/2021 0541   RBC 4.26 10/08/2021 0541   HGB 12.7 (L) 10/08/2021 0541   HCT 36.6 (L) 10/08/2021 0541   PLT 283 10/08/2021 0541   MCV 85.9 10/08/2021 0541   MCH 29.8 10/08/2021 0541   MCHC 34.7 10/08/2021 0541   RDW 12.0 10/08/2021 0541   LYMPHSABS 1.5 10/08/2021 0541   MONOABS 0.9 10/08/2021 0541   EOSABS 0.2 10/08/2021 0541   BASOSABS 0.1 10/08/2021 0541    CMP     Component Value  Date/Time   NA 139 10/08/2021 0541   K 3.4 (L) 10/08/2021 0541   CL 103 10/08/2021 0541   CO2 26 10/08/2021 0541   GLUCOSE 105 (H) 10/08/2021 0541   BUN 13 10/08/2021 0541   CREATININE 0.86 10/08/2021 0541   CALCIUM 8.9 10/08/2021 0541   PROT 6.5 10/08/2021 0541   ALBUMIN 3.0 (L) 10/08/2021 0541   AST 20 10/08/2021 0541   ALT 30 10/08/2021 0541   ALKPHOS 134 (H) 10/08/2021 0541   BILITOT 0.4 10/08/2021 0541   GFRNONAA >60 10/08/2021 0541  Neurodiagnostics EEG with cortical dysfunction in the right temporoparietal region likely secondary to underlying lesion.  No seizures.  Imaging I have reviewed images in epic and the results pertinent to this consultation are: MRI of the brain reveals interval rapid development of expansile edema in the subcortical right parietal lobe with irregular overlying dural enhancement subjacent to the prior scalp/calvarial postsurgical change.  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  Encephalitis remains in the differentials.  Similar versus mild increase size of the right cerebral convexity subdural with 2 to 3 mm of leftward midline shift.  Assessment:  76 year old man with new onset of seizures in the setting right-sided subdural hygroma with mild expansion and what on the initial CT appeared to  be new vasogenic edema in the right parietal region and the MRI appears to be interval rapid development of expansile edema in the subcortical right parietal lobe with irregular overlying dural enhancement concerning for recurrent/progressive tumor and surrounding reactive edema given the prior history of scapular resection and possible eroded overlying calvarium seen on the CT. No emergent neurosurgical intervention Does not raise concern for posterior  reversible encephalopathy syndrome.  Differentials in the radiology read include encephalitis but his clinical exam and history does not suggest any evidence of encephalitis.  Pattern of the edema is very vasogenic consistent more with a neoplastic process rather than an encephalitic process. Subdural hematoma stable to mildly large-no surgical intervention needed. Reached out to neurosurgeon who does not feel the role for biopsy at this time emergently. Also reached out to neurooncology-awaiting callback  Impression New onset seizures in the setting of right parietal lobe cerebral edema Right parietal lobe cerebral edema-question malignancy  Recommendations: Presumptively would like to start steroids given low suspicion for infection. Would like to readdress possible biopsy with neurosurgery after discussion with neurooncology Continue Keppra 500 twice daily I will update you after I speak with the neuro oncologist.   Addendum I discussed this case with Dr. Mickeal Skinner over the phone.  I appreciate him taking a look at the images and running this case with me. There is no real likelihood that this is radiation necrosis.  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  What ever this area of edema is, needs further investigation.  Dr. Mickeal Skinner is going to discuss this in the tumor board on Monday.  There is very low suspicion of this being anything infectious.  We will start him on high-dose dexamethasone. He will need further investigation also in terms of a spinal tap with cytology and flow cytometry-unfortunately those tests are not available on the weekend and doing an LP now would really not yield in any benefit as the sample would not be analyzed till Monday.  The sample needs to be analyzed soon after the tap and not wait for cells to be viable hence will defer this till  Monday. 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  -- Amie Portland, MD Neurologist [facility] Pager: [phone]

## 2021-10-08 NOTE — Progress Notes (Addendum)
PROGRESS NOTE    Hunter Chang  ATF:[id number] DOB: Mar 16, 1946 DOA: 10/07/2021 PCP: Binnie Rail, MD   Brief Narrative:  Hunter Chang is a 76 y.o. male with medical history significant for trigeminal neuralgia, who is admitted to [facility] on 10/07/2021 with suspected acute physical seizure involving the left lower extremity after presenting from home to [facility] ED complaining of fall.   Of note, about a week ago or so, he had an episode that was described as bilateral leg weakness about a week ago.  He was worked up with CTA and MRI at the time which was negative, given that it was bilateral symptoms of unclear etiology, was felt unlikely to be TIA and given that he was resolved with negative work-up he was discharged for outpatient follow-up.  He was also found to have a small chronic right frontal subdural hygroma measuring 5 mm on the CT scan done on 09/26/2021.  Assessment & Plan:   Principal Problem:   Seizures ([place]) Active Problems:   Transient weakness of left lower extremity   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HCC)   Acute encephalopathy  Left lower extremity weakness/?  Presumed seizure: Patient had repeat CT head which showed slightly increased chronic hygroma from 63m to 9 mm with slight midline shift.  He was seen by neurosurgery this morning but they do not seem to be impressed with the small increase in the hygroma and believe that his symptoms are secondary to something else, possibly seizure.  Neurology is on board as well.  Has had spot EEG and it is my understanding that neurology is planning for continuous EEG.  Patient's symptoms have improved, his strength is 4.5/5 in the left lower extremity but normal and rest of the lower extremities.  Speech is fluent.  He was loaded with Keppra and currently is on IV Keppra.  Will defer further work-up to neurology and neurosurgery.  MRI is pending.  Acute metabolic encephalopathy: Patient is now fully alert  and oriented.  Hypokalemia: Replace.  DVT prophylaxis: SCDs Start: 10/07/21 2338   Code Status: Full Code  Family Communication: Patient's wife present at bedside.  Daughter was over the speaker phone and pastor was also present at bedside.  Plan of care discussed with patient in length and he/she verbalized understanding and agreed with it.  Status is: Observation The patient will require care spanning > 2 midnights and should be moved to inpatient because: Patient needs further observation and reevaluation by neurosurgery.   Estimated body mass index is 26.61 kg/m as calculated from the following:   Height as of this encounter: '5\' 8"'$  (1.727 m).   Weight as of this encounter: 79.4 kg.    Nutritional Assessment: Body mass index is 26.61 kg/m..[name] KitchenSeen by dietician.  I agree with the assessment and plan as outlined below: Nutrition Status:        . Skin Assessment: I have examined the patient's skin and I agree with the wound assessment as performed by the wound care RN as outlined below:    Consultants:  Neurosurgery Neurology  Procedures:  None  Antimicrobials:  Anti-infectives (From admission, onward)    None         Subjective: Seen and examined this morning.  Wife and pastor at the bedside.  Patient is feeling better.  He has improved significantly.  Slight weakness in the left lower extremity.  Speech is fluent.  No other complaint.  Objective: Vitals:   10/08/21 0900  10/08/21 0915 10/08/21 0930 10/08/21 1200  BP: 127/77 131/79 125/73 117/73  Pulse: 90 89 83 83  Resp: 19 (!) 22 (!) 21 17  Temp:      TempSrc:      SpO2: 95% 95% 95% 95%  Weight:      Height:        Intake/Output Summary (Last 24 hours) at 10/08/2021 1205 Last data filed at 10/08/2021 4401 Gross per 24 hour  Intake 200 ml  Output --  Net 200 ml   Filed Weights   10/07/21 2131 10/08/21 0111  Weight: 83.2 kg 79.4 kg    Examination:  General exam: Appears calm and comfortable   Respiratory system: Clear to auscultation. Respiratory effort normal. Cardiovascular system: S1 & S2 heard, RRR. No JVD, murmurs, rubs, gallops or clicks. No pedal edema. Gastrointestinal system: Abdomen is nondistended, soft and nontender. No organomegaly or masses felt. Normal bowel sounds heard. Central nervous system: Alert and oriented.  4.5/5 power in left lower extremity.  No other focal deficit. Skin: No rashes, lesions or ulcers Psychiatry: Judgement and insight appear normal. Mood & affect appropriate.    Data Reviewed: I have personally reviewed following labs and imaging studies  CBC: Recent Labs  Lab 10/07/21 2128 10/07/21 2241 10/08/21 0541  WBC 8.3  --  7.4  NEUTROABS 5.8  --  4.8  HGB 13.9 13.9 12.7*  HCT 40.7 41.0 36.6*  MCV 87.3  --  85.9  PLT 350  --  027   Basic Metabolic Panel: Recent Labs  Lab 10/04/21 1227 10/07/21 2128 10/07/21 2241 10/08/21 0541  NA 137 137 137 139  K 3.9 3.6 3.6 3.4*  CL 99 101 102 103  CO2 30 24  --  26  GLUCOSE 94 158* 154* 105*  BUN '12 17 18 13  '$ CREATININE 0.97 1.07 1.10 0.86  CALCIUM 9.9 9.7  --  8.9  MG  --  2.0  --  2.1   GFR: Estimated Creatinine Clearance: 71.8 mL/min (by C-G formula based on SCr of 0.86 mg/dL). Liver Function Tests: Recent Labs  Lab 10/04/21 1227 10/07/21 2128 10/08/21 0541  AST '26 24 20  '$ ALT 41 36 30  ALKPHOS 170* 163* 134*  BILITOT 0.4 0.3 0.4  PROT 7.7 7.3 6.5  ALBUMIN 4.0 3.6 3.0*   No results for input(s): "LIPASE", "AMYLASE" in the last 168 hours. No results for input(s): "AMMONIA" in the last 168 hours. Coagulation Profile: Recent Labs  Lab 10/07/21 2128  INR 1.0   Cardiac Enzymes: No results for input(s): "CKTOTAL", "CKMB", "CKMBINDEX", "TROPONINI" in the last 168 hours. BNP (last 3 results) No results for input(s): "PROBNP" in the last 8760 hours. HbA1C: No results for input(s): "HGBA1C" in the last 72 hours. CBG: Recent Labs  Lab 10/07/21 2126  GLUCAP 157*    Lipid Profile: No results for input(s): "CHOL", "HDL", "LDLCALC", "TRIG", "CHOLHDL", "LDLDIRECT" in the last 72 hours. Thyroid Function Tests: No results for input(s): "TSH", "T4TOTAL", "FREET4", "T3FREE", "THYROIDAB" in the last 72 hours. Anemia Panel: No results for input(s): "VITAMINB12", "FOLATE", "FERRITIN", "TIBC", "IRON", "RETICCTPCT" in the last 72 hours. Sepsis Labs: No results for input(s): "PROCALCITON", "LATICACIDVEN" in the last 168 hours.  Recent Results (from the past 240 hour(s))  Resp Panel by RT-PCR (Flu A&B, Covid) Anterior Nasal Swab     Status: None   Collection Time: 10/07/21  9:26 PM   Specimen: Anterior Nasal Swab  Result Value Ref Range Status   SARS Coronavirus 2 by  RT PCR NEGATIVE NEGATIVE Final    Comment: (NOTE) SARS-CoV-2 target nucleic acids are NOT DETECTED.  The SARS-CoV-2 RNA is generally detectable in upper respiratory specimens during the acute phase of infection. The lowest concentration of SARS-CoV-2 viral copies this assay can detect is 138 copies/mL. A negative result does not preclude SARS-Cov-2 infection and should not be used as the sole basis for treatment or other patient management decisions. A negative result may occur with  improper specimen collection/handling, submission of specimen other than nasopharyngeal swab, presence of viral mutation(s) within the areas targeted by this assay, and inadequate number of viral copies(<138 copies/mL). A negative result must be combined with clinical observations, patient history, and epidemiological information. The expected result is Negative.  Fact Sheet for Patients:  EntrepreneurPulse.com.au  Fact Sheet for Healthcare Providers:  IncredibleEmployment.be  This test is no t yet approved or cleared by the [place] FDA and  has been authorized for detection and/or diagnosis of SARS-CoV-2 by FDA under an Emergency Use Authorization (EUA). This EUA  will remain  in effect (meaning this test can be used) for the duration of the COVID-19 declaration under Section 564(b)(1) of the Act, 21 U.S.C.section 360bbb-3(b)(1), unless the authorization is terminated  or revoked sooner.       Influenza A by PCR NEGATIVE NEGATIVE Final   Influenza B by PCR NEGATIVE NEGATIVE Final    Comment: (NOTE) The Xpert Xpress SARS-CoV-2/FLU/RSV plus assay is intended as an aid in the diagnosis of influenza from Nasopharyngeal swab specimens and should not be used as a sole basis for treatment. Nasal washings and aspirates are unacceptable for Xpert Xpress SARS-CoV-2/FLU/RSV testing.  Fact Sheet for Patients: EntrepreneurPulse.com.au  Fact Sheet for Healthcare Providers: IncredibleEmployment.be  This test is not yet approved or cleared by the [place] FDA and has been authorized for detection and/or diagnosis of SARS-CoV-2 by FDA under an Emergency Use Authorization (EUA). This EUA will remain in effect (meaning this test can be used) for the duration of the COVID-19 declaration under Section 564(b)(1) of the Act, 21 U.S.C. section 360bbb-3(b)(1), unless the authorization is terminated or revoked.  Performed at [facility], [place] [address], [place],  [address]      Radiology Studies: MR BRAIN W WO CONTRAST  Result Date: 10/08/2021 CLINICAL DATA:  Seizure, new-onset. Per chart review, reportedly had a fall and has H/O SCALP TUMOR REMOVAL WITH RADIATION EXAM: MRI HEAD WITHOUT AND WITH CONTRAST TECHNIQUE: Multiplanar, multiecho pulse sequences of the brain and surrounding structures were obtained without and with intravenous contrast. CONTRAST:  16m GADAVIST GADOBUTROL 1 MMOL/ML IV SOLN COMPARISON:  MRI 09/26/2021. FINDINGS: Motion limited. Brain: Similar versus mildly increased size of a right cerebral convexity extra-axial fluid collection which measures up to 12 mm superiorly, probably largely due  to redistribution. Mass effect is similar with approximately 2-3 mm of leftward midline shift. Interval rapid development of expansile edema in the subcortical right parietal lobe with irregular overlying dural enhancement subjacent to prior scalp/postsurgical change. No evidence of acute hemorrhage, acute infarct, or hydrocephalus. Additional scattered T2/FLAIR hyperintensities in the white matter nonspecific but compatible with chronic microvascular ischemic disease. Vascular: Major arterial flow voids are maintained at the skull base. Skull and upper cervical spine: Normal marrow signal. Sinuses/Orbits: Clear sinuses.  Unremarkable orbits. Other: No mastoid effusions. IMPRESSION: 1. Interval rapid development of expansile edema in the subcortical right parietal lobe with irregular overlying dural enhancement subjacent to prior scalp/calvarial postsurgical change. 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 Encephalitis is a differential consideration. The adjacent superior sagittal sinus is small, but appears patent making dural venous sinus thrombosis unlikely. A follow-up MRI after resolution of the patient's subdural hematoma may be helpful to ensure that findings are not reactive given adjacent hematoma. 2. Similar versus mildly increased size of a right cerebral convexity subdural hematoma which measures up to 12 mm superiorly, probably largely due to redistribution. Mass effect is similar with approximately 2-3 mm of leftward midline shift. Findings discussed with Dr. Alvino Chapel via telephone at 11:30 AM. Electronically Signed   By: Margaretha Sheffield M.D.   On: 10/08/2021 11:36   EEG adult  Result Date: 10/08/2021 Lora Havens, MD     10/08/2021  8:44 AM Patient Name: Hunter Chang MRN: [id number]  Epilepsy Attending: Lora Havens Referring Physician/Provider: Greta Doom, MD Date: 10/08/2021 Duration: 26.43 mins Patient history:  76 year old male with new onset seizures in the setting of right-sided hygroma with tiny amount of acute blood, as well as what appears to be new vasogenic edema of the right parietal region. EEG to evaluate for seizure. Level of alertness: Awake, asleep AEDs during EEG study: LEV Technical aspects: This EEG study was done with scalp electrodes positioned according to the 10-20 International system of electrode placement. Electrical activity was acquired at a sampling rate of '500Hz'$  and reviewed with a high frequency filter of '70Hz'$  and a low frequency filter of '1Hz'$ . EEG data were recorded continuously and digitally stored. Description: The posterior dominant rhythm consists of 9-10 Hz activity of moderate voltage (25-35 uV) seen predominantly in posterior head regions, symmetric and reactive to eye opening and eye closing. Sleep was characterized by vertex waves, sleep spindles (12 to 14 Hz), maximal frontocentral region.  EEG showed intermittent 3 to 5 Hz theta-delta slowing in right temporo-parietal region.  Hyperventilation and photic stimulation were not performed.   ABNORMALITY - Intermittent slow, right temporo-parietal region IMPRESSION: This study is suggestive of cortical dysfunction in right temporoparietal region, likely secondary to underlying lesion. No seizures or epileptiform discharges were seen throughout the recording. Lora Havens   DG CHEST PORT 1 VIEW  Result Date: 10/08/2021 CLINICAL DATA:  76 year old male with history of acute encephalopathy. EXAM: PORTABLE CHEST 1 VIEW COMPARISON:  No priors. FINDINGS: Lung volumes are low. No consolidative airspace disease. No pleural effusions. No pneumothorax. No pulmonary nodule or mass noted. Pulmonary vasculature and the cardiomediastinal silhouette are within normal limits. Atherosclerosis in the  thoracic aorta. IMPRESSION: 1.  No radiographic evidence of acute cardiopulmonary disease. 2. Aortic atherosclerosis. Electronically Signed   By: Vinnie Langton M.D.   On: 10/08/2021 07:29   CT ANGIO HEAD NECK W WO CM (CODE STROKE)  Result Date: 10/07/2021 CLINICAL DATA:  Initial evaluation for neuro deficit, stroke suspected. EXAM: CT ANGIOGRAPHY HEAD AND NECK TECHNIQUE: Multidetector CT imaging of the head and neck was performed using the standard protocol during bolus administration of intravenous contrast. Multiplanar CT image reconstructions and MIPs were obtained to evaluate the vascular anatomy. Carotid stenosis measurements (when applicable) are obtained utilizing NASCET criteria, using the distal internal carotid diameter as the denominator. RADIATION DOSE REDUCTION: This exam was performed according to the departmental dose-optimization program which includes automated exposure control, adjustment of the mA and/or kV according to patient size and/or use of iterative reconstruction technique. CONTRAST:  61m OMNIPAQUE IOHEXOL 350 MG/ML SOLN COMPARISON:  Prior CT from earlier the same day as well as earlier exams. FINDINGS: CTA NECK FINDINGS Aortic arch: Visualized aortic arch normal caliber with standard 3 vessel morphology. No stenosis about the origin the great vessels. Right carotid system: Right common carotid artery widely patent. Eccentric mixed plaque about the proximal cervical right ICA with associated stenosis of up to 50% by NASCET criteria. Right ICA patent distally without stenosis or dissection. Left carotid system: Left CCA patent from its origin to the bifurcation without stenosis. Eccentric plaque about the proximal cervical left ICA with associated stenosis of up to 40% by NASCET criteria. Left ICA patent distally without stenosis or dissection. Vertebral arteries: Both vertebral arteries arise from the subclavian arteries. No proximal subclavian artery stenosis. Atheromatous change  at the origins of both vertebral arteries with associated moderate to severe ostial stenosis on the left. Vertebral arteries otherwise patent distally without stenosis or dissection. Right vertebral artery slightly dominant. Skeleton: No discrete or worrisome osseous lesions. Reversal of the normal cervical lordosis with moderate spondylosis at C6-7. Other neck: No other acute soft tissue abnormality within the neck. Upper chest: Visualized upper chest demonstrates no other acute finding. Review of the MIP images confirms the above findings CTA HEAD FINDINGS Anterior circulation: Petrous segments widely patent. Mild for age atheromatous change within the carotid siphons without stenosis. A1 segments patent. Normal anterior compared to complex. Anterior cerebral arteries patent without stenosis. No M1 stenosis or occlusion. No proximal MCA branch occlusion. Distal MCA branches perfused and symmetric. Posterior circulation: Both vertebral arteries patent without stenosis. Both PICA patent. Basilar patent to its distal aspect without stenosis. Superior cerebral arteries patent bilaterally. Right PCA supplied via the basilar. Fetal type origin of the left PCA. Both PCAs patent to their distal aspects without stenosis. Venous sinuses: Dedicated delayed venous phase images were performed. Normal enhancement seen throughout the superior sagittal sinus anteriorly. Mildly heterogeneous enhancement seen subjacent to the patient's craniotomy defect within the mid-posterior superior sagittal sinus, with no convincing evidence for dural sinus thrombosis. Superior sagittal sinus patent distally to the torcula. Transverse and sigmoid sinuses are patent as are the jugular bulbs and visualized proximal internal jugular veins. Right transverse sinus dominant. Straight sinus, vein of Galen, internal cerebral veins, and basal veins of Rosenthal are patent. No convincing cortical vein thrombosis, although note is made of some asymmetric  filling of cortical veins overlying the right cerebral hemisphere, possibly due to the adjacent subdural collection and edema. Anatomic variants: None significant.  No aneurysm. Review of the MIP images confirms the above findings IMPRESSION: 1. Negative CTA for large vessel occlusion or other emergent finding. 2. Atheromatous change about the carotid bifurcations with associated stenosis of up to 50% on the right and 40% on the left. 3. 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 4. Grossly negative CTV. No convincing evidence for dural sinus thrombosis, although note is made of some heterogeneous filling of cortical veins overlying the right parietal convexity, possibly due to the adjacent subdural collection and edema. These results were communicated to Dr. KLeonel Ramsayat 10:11 pm on 10/07/2021 by text page via the [facility] system. Electronically Signed   By: BJeannine BogaM.D.   On: 10/07/2021 22:29   CT HEAD CODE STROKE WO CONTRAST  Result Date: 10/07/2021 CLINICAL DATA:  Code stroke. Initial evaluation for neuro deficit, stroke suspected. EXAM: CT HEAD WITHOUT CONTRAST TECHNIQUE: Contiguous axial images were obtained from  the base of the skull through the vertex without intravenous contrast. RADIATION DOSE REDUCTION: This exam was performed according to the departmental dose-optimization program which includes automated exposure control, adjustment of the mA and/or kV according to patient size and/or use of iterative reconstruction technique. COMPARISON:  Prior MRI and CT from 09/26/2021. FINDINGS: Brain: Cerebral volume within normal limits. 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 There is increased hypodensity at the subcortical right parietal region, new from prior exam, with appearance more typical of vasogenic edema. Finding raises the possibility for an underlying  lesion or possibly PRES. No visible acute large vessel territory infarct. No other visible mass lesion. 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 Associated trace 2 mm right-to-left shift. Basilar cisterns remain patent. No hydrocephalus or trapping. No other acute intracranial hemorrhage. Vascular: No abnormal hyperdense vessel. Skull: Prior craniotomy defect noted at the right calvarium. Calvarium otherwise intact. No acute scalp soft tissue abnormality. Sinuses/Orbits: Globes and orbital soft tissues demonstrate no acute finding. Paranasal sinuses are clear. Mastoid air cells are clear. Other: None. ASPECTS [facility] Stroke Program Early CT Score) - Ganglionic level infarction (caudate, lentiform nuclei, internal capsule, insula, M1-M3 cortex): 7 - Supraganglionic infarction (M4-M6 cortex): 3 Total score (0-10 with 10 being normal): 10 IMPRESSION: 1. No definite acute ischemic changes. 2. ASPECTS is 10. 3. 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 4. Increased hypodensity involving the subcortical right parietal lobe, new/progressed as compared to 09/26/2021. Finding is indeterminate, but raises the possibility for an underlying lesion. Possible PRES could also be considered. Further evaluation with dedicated brain MRI suggested for further evaluation. These results were communicated to Dr. Leonel Ramsay at 9:47 pm on 10/07/2021 by text page via the [facility] messaging system. Results were also discussed by telephone at the time of interpretation on 10/07/2021 at 9:53 pm to provider Dr. Leonel Ramsay. Electronically Signed   By: Jeannine Boga M.D.   On: 10/07/2021 21:54    Scheduled Meds: Continuous Infusions:  levETIRAcetam Stopped (10/08/21 4656)     LOS: 0 days   Darliss Cheney, MD [facility]  10/08/2021, 12:05 PM   *Please note that this is a verbal dictation  therefore any spelling or grammatical errors are due to the "[place] [employer]" system interpretation.  Please page via [place] and do not message via secure chat for urgent patient care matters. Secure chat can be used for non urgent patient care matters.  How to contact the [facility] Attending or Consulting provider [place] or covering provider during after hours Outlook, for this patient?  Check the care team in [facility] and look for a) attending/consulting [facility] provider listed and b) the [facility] team listed. Page or secure chat [phone]. Log into [email or web address] and use [place]'s universal password to access. If you do not have the password, please contact the hospital operator. Locate the [facility] provider you are looking for under [facility] and page to a number that you can be directly reached. If you still have difficulty reaching the provider, please page the [facility] (Director on Call) for the Hospitalists listed on amion for assistance.

## 2021-10-08 NOTE — Evaluation (Signed)
Occupational Therapy Evaluation Patient Details Name: Hunter Chang MRN: [id number] DOB: 10-23-1945 Today's Date: 10/08/2021   History of Present Illness 76 y.o. M admitted due to weakness in LLE and LUE. Pt being worked up for acute physical seizures. PMH significant for trigeminal neuralgia.   Clinical Impression   Pt admitted for concerns listed above. PTA pt reported that he was independent with all ADL's and IADL's, including driving and working. AT this time, pt presents with balance deficits, weakness, and decreased activity tolerance. He has decreased motor planning/proprioception of his LUE and LLE. Overall requiring mod-max A for functional mobility and ADL's. Will need +2 to progress ambulation due to balance. Recommending AIR at this time, as pt was very independent prior to this decline. OT will continue to follow acutely.      Recommendations for follow up therapy are one component of a multi-disciplinary discharge planning process, led by the attending physician.  Recommendations may be updated based on patient status, additional functional criteria and insurance authorization.   Follow Up Recommendations  Acute inpatient rehab (3hours/day)    Assistance Recommended at Discharge Frequent or constant Supervision/Assistance  Patient can return home with the following A lot of help with walking and/or transfers;Two people to help with walking and/or transfers;A lot of help with bathing/dressing/bathroom;Assistance with cooking/housework;Direct supervision/assist for medications management;Direct supervision/assist for financial management;Assist for transportation;Help with stairs or ramp for entrance    Functional Status Assessment  Patient has had a recent decline in their functional status and demonstrates the ability to make significant improvements in function in a reasonable and predictable amount of time.  Equipment Recommendations  Other (comment) (TBD)     Recommendations for Other Services Rehab consult     Precautions / Restrictions Precautions Precautions: Fall Restrictions Weight Bearing Restrictions: No      Mobility Bed Mobility Overal bed mobility: Needs Assistance Bed Mobility: Supine to Sit, Sit to Supine     Supine to sit: Min assist Sit to supine: Min guard   General bed mobility comments: Min A to pull to sitting and reposition on EOB (pt had difficulty moving his LLE around to in front of him)    Transfers Overall transfer level: Needs assistance Equipment used: 1 person hand held assist Transfers: Sit to/from Stand Sit to Stand: Mod assist           General transfer comment: Mod A to power up and steady - required face to face technique.      Balance Overall balance assessment: Needs assistance Sitting-balance support: Single extremity supported, Feet unsupported Sitting balance-Leahy Scale: Fair Sitting balance - Comments: Pt leaning to the L, however he is able to maintain static balance with min guard Postural control: Left lateral lean Standing balance support: Bilateral upper extremity supported Standing balance-Leahy Scale: Poor Standing balance comment: Pt unable to maintain upright balance, leaning forwards, backwards, and to the L, requiring mod A to maintain upright posture.                           ADL either performed or assessed with clinical judgement   ADL Overall ADL's : Needs assistance/impaired Eating/Feeding: Set up;Sitting   Grooming: Set up;Sitting   Upper Body Bathing: Minimal assistance;Sitting   Lower Body Bathing: Moderate assistance;Maximal assistance;+2 for safety/equipment;Sitting/lateral leans   Upper Body Dressing : Minimal assistance;Sitting   Lower Body Dressing: Moderate assistance;Maximal assistance;Sitting/lateral leans;Sit to/from stand;+2 for safety/equipment   Toilet Transfer: Maximal assistance;Stand-pivot  Toileting- Clothing Manipulation  and Hygiene: Moderate assistance;Sitting/lateral lean         General ADL Comments: Pt limited by poor balance and difficulty motor planning     Vision Baseline Vision/History: 3 Glaucoma Ability to See in Adequate Light: 0 Adequate Patient Visual Report: No change from baseline Vision Assessment?: No apparent visual deficits     Perception     Praxis      Pertinent Vitals/Pain Pain Assessment Pain Assessment: No/denies pain     Hand Dominance Right   Extremity/Trunk Assessment Upper Extremity Assessment Upper Extremity Assessment: Generalized weakness;LUE deficits/detail LUE Deficits / Details: Poor motor planning and proprioception. LUE Sensation: decreased proprioception LUE Coordination: decreased fine motor   Lower Extremity Assessment Lower Extremity Assessment: Generalized weakness   Cervical / Trunk Assessment Cervical / Trunk Assessment: Kyphotic   Communication Communication Communication: No difficulties   Cognition Arousal/Alertness: Awake/alert Behavior During Therapy: WFL for tasks assessed/performed Overall Cognitive Status: Within Functional Limits for tasks assessed                                       General Comments  VSS on RA    Exercises     Shoulder Instructions      Home Living Family/patient expects to be discharged to:: Private residence Living Arrangements: Spouse/significant other Available Help at Discharge: Family;Available 24 hours/day Type of Home: House Home Access: Level entry     Home Layout: One level     Bathroom Shower/Tub: Teacher, early years/pre: Handicapped height     Home Equipment: Rollator (4 wheels);Rolling Walker (2 wheels)          Prior Functioning/Environment Prior Level of Function : Independent/Modified Independent;Working/employed;Driving             Mobility Comments: no AD ADLs Comments: Indep        OT Problem List: Decreased strength;Decreased  range of motion;Decreased activity tolerance;Impaired balance (sitting and/or standing);Decreased safety awareness;Decreased coordination;Impaired sensation;Impaired UE functional use      OT Treatment/Interventions: Self-care/ADL training;Therapeutic exercise;Energy conservation;DME and/or AE instruction;Therapeutic activities;Patient/family education;Balance training    OT Goals(Current goals can be found in the care plan section) Acute Rehab OT Goals Patient Stated Goal: To get stronger OT Goal Formulation: With patient/family Time For Goal Achievement: 10/22/21 Potential to Achieve Goals: Good ADL Goals Pt Will Perform Grooming: with modified independence;standing Pt Will Perform Lower Body Bathing: with modified independence;sitting/lateral leans;sit to/from stand Pt Will Perform Lower Body Dressing: with modified independence;sit to/from stand;sitting/lateral leans Pt Will Transfer to Toilet: with modified independence;ambulating Pt Will Perform Toileting - Clothing Manipulation and hygiene: with modified independence;sitting/lateral leans;sit to/from stand  OT Frequency: Min 2X/week    Co-evaluation              AM-PAC OT "6 Clicks" Daily Activity     Outcome Measure Help from another person eating meals?: A Little Help from another person taking care of personal grooming?: A Little Help from another person toileting, which includes using toliet, bedpan, or urinal?: A Lot Help from another person bathing (including washing, rinsing, drying)?: A Lot Help from another person to put on and taking off regular upper body clothing?: A Little Help from another person to put on and taking off regular lower body clothing?: A Lot 6 Click Score: 15   End of Session Nurse Communication: Mobility status  Activity Tolerance: Patient tolerated treatment well Patient  left: in bed;with call bell/phone within reach;with family/visitor present  OT Visit Diagnosis: Unsteadiness on feet  (R26.81);Other abnormalities of gait and mobility (R26.89);Muscle weakness (generalized) (M62.81);Ataxia, unspecified (R27.0)                Time: 5329-9242 OT Time Calculation (min): 42 min Charges:  OT General Charges $OT Visit: 1 Visit OT Evaluation $OT Eval Moderate Complexity: 1 Mod OT Treatments $Self Care/Home Management : 8-22 mins $Therapeutic Activity: 8-22 mins  Bracen Schum H., OTR/L Acute Rehabilitation  Kamori Barbier Elane Asyria Kolander 10/08/2021, 1:48 PM

## 2021-10-08 NOTE — ED Notes (Addendum)
Pt remains lethargic due to ativan. Airway clear, respirations even and unlabored. This RN to hold off on swallow screen until pt becomes more alert

## 2021-10-08 NOTE — Consult Note (Signed)
Reason for Consult: Left-sided weakness, subdural collection Referring Physician: Dr. [name] is an 76 y.o. male.  HPI: Patient is a 76 year old individual whose had extensive radiation to his scalp for a skin cancer.  There is an area of skin loss with exposed bony surface that has been being followed by a Psychiatric nurse.  This is in the right parietal region.  Patient has had a couple of episodes now of significant weakness on the left side.  A few weeks ago he was in the hospital and was noted that he had a very small 5 mm subdural collection.  Another episode occurred yesterday and he came to the emergency department and a repeat CT scan demonstrates that the right frontal parietal subdural collection is approximately 9 mm in thickness.  There is a slight bit of mass effect from this.  The patient currently though is awake alert he does describe some weakness in the left side his wife has been concerned because of these recurrent episodes.  Past Medical History:  Diagnosis Date   Arthritis    Chicken pox    [place] measles    Trigeminal neuralgia     Past Surgical History:  Procedure Laterality Date   HERNIA REPAIR     3 times    Family History  Problem Relation Age of Onset   Arthritis Mother    Diabetes Father    Early death Father    Heart disease Father    Heart attack Father    Early death Maternal Grandfather    COPD Paternal Grandfather     Social History:  reports that he has never smoked. He has never used smokeless tobacco. He reports that he does not drink alcohol and does not use drugs.  Allergies:  Allergies  Allergen Reactions   Succinylcholine Chloride Other (See Comments)    "Paralyzed my diaphragm"    Medications: I have reviewed the patient's current medications.  Results for orders placed or performed during the hospital encounter of 10/07/21 (from the past 48 hour(s))  Resp Panel by RT-PCR (Flu A&B, Covid) Anterior Nasal Swab      Status: None   Collection Time: 10/07/21  9:26 PM   Specimen: Anterior Nasal Swab  Result Value Ref Range   SARS Coronavirus 2 by RT PCR NEGATIVE NEGATIVE    Comment: (NOTE) SARS-CoV-2 target nucleic acids are NOT DETECTED.  The SARS-CoV-2 RNA is generally detectable in upper respiratory specimens during the acute phase of infection. The lowest concentration of SARS-CoV-2 viral copies this assay can detect is 138 copies/mL. A negative result does not preclude SARS-Cov-2 infection and should not be used as the sole basis for treatment or other patient management decisions. A negative result may occur with  improper specimen collection/handling, submission of specimen other than nasopharyngeal swab, presence of viral mutation(s) within the areas targeted by this assay, and inadequate number of viral copies(<138 copies/mL). A negative result must be combined with clinical observations, patient history, and epidemiological information. The expected result is Negative.  Fact Sheet for Patients:  EntrepreneurPulse.com.au  Fact Sheet for Healthcare Providers:  IncredibleEmployment.be  This test is no t yet approved or cleared by the [place] FDA and  has been authorized for detection and/or diagnosis of SARS-CoV-2 by FDA under an Emergency Use Authorization (EUA). This EUA will remain  in effect (meaning this test can be used) for the duration of the COVID-19 declaration under Section 564(b)(1) of the Act, 21 U.S.C.section 360bbb-3(b)(1), unless  the authorization is terminated  or revoked sooner.       Influenza A by PCR NEGATIVE NEGATIVE   Influenza B by PCR NEGATIVE NEGATIVE    Comment: (NOTE) The Xpert Xpress SARS-CoV-2/FLU/RSV plus assay is intended as an aid in the diagnosis of influenza from Nasopharyngeal swab specimens and should not be used as a sole basis for treatment. Nasal washings and aspirates are unacceptable for Xpert Xpress  SARS-CoV-2/FLU/RSV testing.  Fact Sheet for Patients: EntrepreneurPulse.com.au  Fact Sheet for Healthcare Providers: IncredibleEmployment.be  This test is not yet approved or cleared by the [place] FDA and has been authorized for detection and/or diagnosis of SARS-CoV-2 by FDA under an Emergency Use Authorization (EUA). This EUA will remain in effect (meaning this test can be used) for the duration of the COVID-19 declaration under Section 564(b)(1) of the Act, 21 U.S.C. section 360bbb-3(b)(1), unless the authorization is terminated or revoked.  Performed at [facility], [place] [address], [place], [place] [address]   CBG monitoring, ED     Status: Abnormal   Collection Time: 10/07/21  9:26 PM  Result Value Ref Range   Glucose-Capillary 157 (H) 70 - 99 mg/dL    Comment: Glucose reference range applies only to samples taken after fasting for at least 8 hours.  Ethanol     Status: None   Collection Time: 10/07/21  9:28 PM  Result Value Ref Range   Alcohol, Ethyl (B) <10 <10 mg/dL    Comment: (NOTE) Lowest detectable limit for serum alcohol is 10 mg/dL.  For medical purposes only. Performed at [facility], [place] [address], [place], [place] [address]   Protime-INR     Status: None   Collection Time: 10/07/21  9:28 PM  Result Value Ref Range   Prothrombin Time 13.2 11.4 - 15.2 seconds   INR 1.0 0.8 - 1.2    Comment: (NOTE) INR goal varies based on device and disease states. Performed at [facility], [place] [address] [place], [place] [address]   APTT     Status: None   Collection Time: 10/07/21  9:28 PM  Result Value Ref Range   aPTT 28 24 - 36 seconds    Comment: Performed at [place] [address], [place], [place] [address]  CBC     Status: None   Collection Time: 10/07/21  9:28 PM  Result Value Ref Range   WBC 8.3 4.0 - 10.5 K/uL   RBC 4.66 4.22 - 5.81 MIL/uL   Hemoglobin 13.9 13.0 - 17.0 g/dL    HCT 40.7 39.0 - 52.0 %   MCV 87.3 80.0 - 100.0 fL   MCH 29.8 26.0 - 34.0 pg   MCHC 34.2 30.0 - 36.0 g/dL   RDW 12.1 11.5 - 15.5 %   Platelets 350 150 - 400 K/uL   nRBC 0.0 0.0 - 0.2 %    Comment: Performed at [facility], [place] [address], [place], [place] [address]  Differential     Status: None   Collection Time: 10/07/21  9:28 PM  Result Value Ref Range   Neutrophils Relative % 69 %   Neutro Abs 5.8 1.7 - 7.7 K/uL   Lymphocytes Relative 19 %   Lymphs Abs 1.6 0.7 - 4.0 K/uL   Monocytes Relative 9 %   Monocytes Absolute 0.7 0.1 - 1.0 K/uL   Eosinophils Relative 2 %   Eosinophils Absolute 0.1 0.0 - 0.5 K/uL   Basophils  Relative 1 %   Basophils Absolute 0.1 0.0 - 0.1 K/uL   Immature Granulocytes 0 %   Abs Immature Granulocytes 0.03 0.00 - 0.07 K/uL    Comment: Performed at [facility], [place] [address] [place], [place] [address]  Comprehensive metabolic panel     Status: Abnormal   Collection Time: 10/07/21  9:28 PM  Result Value Ref Range   Sodium 137 135 - 145 mmol/L   Potassium 3.6 3.5 - 5.1 mmol/L   Chloride 101 98 - 111 mmol/L   CO2 24 22 - 32 mmol/L   Glucose, Bld 158 (H) 70 - 99 mg/dL    Comment: Glucose reference range applies only to samples taken after fasting for at least 8 hours.   BUN 17 8 - 23 mg/dL   Creatinine, Ser 1.07 0.61 - 1.24 mg/dL   Calcium 9.7 8.9 - 10.3 mg/dL   Total Protein 7.3 6.5 - 8.1 g/dL   Albumin 3.6 3.5 - 5.0 g/dL   AST 24 15 - 41 U/L   ALT 36 0 - 44 U/L   Alkaline Phosphatase 163 (H) 38 - 126 U/L   Total Bilirubin 0.3 0.3 - 1.2 mg/dL   GFR, Estimated >60 >60 mL/min    Comment: (NOTE) Calculated using the CKD-EPI Creatinine Equation (2021)    Anion gap 12 5 - 15    Comment: Performed at [place] [address], [place], [place] [address]  Magnesium     Status: None   Collection Time: 10/07/21  9:28 PM  Result Value Ref Range   Magnesium 2.0 1.7 - 2.4 mg/dL    Comment: Performed at [facility],  [place] [address] [place], [place] [address]  I-stat chem 8, ED     Status: Abnormal   Collection Time: 10/07/21 10:41 PM  Result Value Ref Range   Sodium 137 135 - 145 mmol/L   Potassium 3.6 3.5 - 5.1 mmol/L   Chloride 102 98 - 111 mmol/L   BUN 18 8 - 23 mg/dL   Creatinine, Ser 1.10 0.61 - 1.24 mg/dL   Glucose, Bld 154 (H) 70 - 99 mg/dL    Comment: Glucose reference range applies only to samples taken after fasting for at least 8 hours.   Calcium, Ion 1.16 1.15 - 1.40 mmol/L   TCO2 26 22 - 32 mmol/L   Hemoglobin 13.9 13.0 - 17.0 g/dL   HCT 41.0 39.0 - 52.0 %  Urine rapid drug screen (hosp performed)     Status: None   Collection Time: 10/08/21  4:03 AM  Result Value Ref Range   Opiates NONE DETECTED NONE DETECTED   Cocaine NONE DETECTED NONE DETECTED   Benzodiazepines NONE DETECTED NONE DETECTED   Amphetamines NONE DETECTED NONE DETECTED   Tetrahydrocannabinol NONE DETECTED NONE DETECTED   Barbiturates NONE DETECTED NONE DETECTED    Comment: (NOTE) DRUG SCREEN FOR MEDICAL PURPOSES ONLY.  IF CONFIRMATION IS NEEDED FOR ANY PURPOSE, NOTIFY LAB WITHIN 5 DAYS.  LOWEST DETECTABLE LIMITS FOR URINE DRUG SCREEN Drug Class                     Cutoff (ng/mL) Amphetamine and metabolites    1000 Barbiturate and metabolites    200 Benzodiazepine                 650 Tricyclics and metabolites     300 Opiates and metabolites        300 Cocaine  and metabolites        300 THC                            50 Performed at [facility], [place] [address] [place], [place] [address]   Urinalysis, Routine w reflex microscopic     Status: Abnormal   Collection Time: 10/08/21  4:03 AM  Result Value Ref Range   Color, Urine YELLOW YELLOW   APPearance CLEAR CLEAR   Specific Gravity, Urine >1.046 (H) 1.005 - 1.030   pH 5.0 5.0 - 8.0   Glucose, UA NEGATIVE NEGATIVE mg/dL   Hgb urine dipstick NEGATIVE NEGATIVE   Bilirubin Urine NEGATIVE NEGATIVE   Ketones, ur NEGATIVE NEGATIVE mg/dL   Protein, ur  NEGATIVE NEGATIVE mg/dL   Nitrite NEGATIVE NEGATIVE   Leukocytes,Ua NEGATIVE NEGATIVE    Comment: Performed at [facility] [address], [place], [place] [address]  CBC with Differential/Platelet     Status: Abnormal   Collection Time: 10/08/21  5:41 AM  Result Value Ref Range   WBC 7.4 4.0 - 10.5 K/uL   RBC 4.26 4.22 - 5.81 MIL/uL   Hemoglobin 12.7 (L) 13.0 - 17.0 g/dL   HCT 36.6 (L) 39.0 - 52.0 %   MCV 85.9 80.0 - 100.0 fL   MCH 29.8 26.0 - 34.0 pg   MCHC 34.7 30.0 - 36.0 g/dL   RDW 12.0 11.5 - 15.5 %   Platelets 283 150 - 400 K/uL   nRBC 0.0 0.0 - 0.2 %   Neutrophils Relative % 64 %   Neutro Abs 4.8 1.7 - 7.7 K/uL   Lymphocytes Relative 20 %   Lymphs Abs 1.5 0.7 - 4.0 K/uL   Monocytes Relative 12 %   Monocytes Absolute 0.9 0.1 - 1.0 K/uL   Eosinophils Relative 2 %   Eosinophils Absolute 0.2 0.0 - 0.5 K/uL   Basophils Relative 1 %   Basophils Absolute 0.1 0.0 - 0.1 K/uL   Immature Granulocytes 1 %   Abs Immature Granulocytes 0.04 0.00 - 0.07 K/uL    Comment: Performed at [place] [facility] [address] [place], [place] [address]  Comprehensive metabolic panel     Status: Abnormal   Collection Time: 10/08/21  5:41 AM  Result Value Ref Range   Sodium 139 135 - 145 mmol/L   Potassium 3.4 (L) 3.5 - 5.1 mmol/L   Chloride 103 98 - 111 mmol/L   CO2 26 22 - 32 mmol/L   Glucose, Bld 105 (H) 70 - 99 mg/dL    Comment: Glucose reference range applies only to samples taken after fasting for at least 8 hours.   BUN 13 8 - 23 mg/dL   Creatinine, Ser 0.86 0.61 - 1.24 mg/dL   Calcium 8.9 8.9 - 10.3 mg/dL   Total Protein 6.5 6.5 - 8.1 g/dL   Albumin 3.0 (L) 3.5 - 5.0 g/dL   AST 20 15 - 41 U/L   ALT 30 0 - 44 U/L   Alkaline Phosphatase 134 (H) 38 - 126 U/L   Total Bilirubin 0.4 0.3 - 1.2 mg/dL   GFR, Estimated >60 >60 mL/min    Comment: (NOTE) Calculated using the CKD-EPI Creatinine Equation (2021)    Anion gap 10 5 - 15    Comment: Performed at [place] [address], [place],  [address]  Magnesium     Status: None  Collection Time: 10/08/21  5:41 AM  Result Value Ref Range   Magnesium 2.1 1.7 - 2.4 mg/dL    Comment: Performed at [facility], [place] [address] [place], [place] [address]    DG CHEST PORT 1 VIEW  Result Date: 10/08/2021 CLINICAL DATA:  76 year old male with history of acute encephalopathy. EXAM: PORTABLE CHEST 1 VIEW COMPARISON:  No priors. FINDINGS: Lung volumes are low. No consolidative airspace disease. No pleural effusions. No pneumothorax. No pulmonary nodule or mass noted. Pulmonary vasculature and the cardiomediastinal silhouette are within normal limits. Atherosclerosis in the thoracic aorta. IMPRESSION: 1.  No radiographic evidence of acute cardiopulmonary disease. 2. Aortic atherosclerosis. Electronically Signed   By: Vinnie Langton M.D.   On: 10/08/2021 07:29   CT ANGIO HEAD NECK W WO CM (CODE STROKE)  Result Date: 10/07/2021 CLINICAL DATA:  Initial evaluation for neuro deficit, stroke suspected. EXAM: CT ANGIOGRAPHY HEAD AND NECK TECHNIQUE: Multidetector CT imaging of the head and neck was performed using the standard protocol during bolus administration of intravenous contrast. Multiplanar CT image reconstructions and MIPs were obtained to evaluate the vascular anatomy. Carotid stenosis measurements (when applicable) are obtained utilizing NASCET criteria, using the distal internal carotid diameter as the denominator. RADIATION DOSE REDUCTION: This exam was performed according to the departmental dose-optimization program which includes automated exposure control, adjustment of the mA and/or kV according to patient size and/or use of iterative reconstruction technique. CONTRAST:  55m OMNIPAQUE IOHEXOL 350 MG/ML SOLN COMPARISON:  Prior CT from earlier the same day as well as earlier exams. FINDINGS: CTA NECK FINDINGS Aortic arch: Visualized aortic arch normal caliber with standard 3 vessel morphology. No  stenosis about the origin the great vessels. Right carotid system: Right common carotid artery widely patent. Eccentric mixed plaque about the proximal cervical right ICA with associated stenosis of up to 50% by NASCET criteria. Right ICA patent distally without stenosis or dissection. Left carotid system: Left CCA patent from its origin to the bifurcation without stenosis. Eccentric plaque about the proximal cervical left ICA with associated stenosis of up to 40% by NASCET criteria. Left ICA patent distally without stenosis or dissection. Vertebral arteries: Both vertebral arteries arise from the subclavian arteries. No proximal subclavian artery stenosis. Atheromatous change at the origins of both vertebral arteries with associated moderate to severe ostial stenosis on the left. Vertebral arteries otherwise patent distally without stenosis or dissection. Right vertebral artery slightly dominant. Skeleton: No discrete or worrisome osseous lesions. Reversal of the normal cervical lordosis with moderate spondylosis at C6-7. Other neck: No other acute soft tissue abnormality within the neck. Upper chest: Visualized upper chest demonstrates no other acute finding. Review of the MIP images confirms the above findings CTA HEAD FINDINGS Anterior circulation: Petrous segments widely patent. Mild for age atheromatous change within the carotid siphons without stenosis. A1 segments patent. Normal anterior compared to complex. Anterior cerebral arteries patent without stenosis. No M1 stenosis or occlusion. No proximal MCA branch occlusion. Distal MCA branches perfused and symmetric. Posterior circulation: Both vertebral arteries patent without stenosis. Both PICA patent. Basilar patent to its distal aspect without stenosis. Superior cerebral arteries patent bilaterally. Right PCA supplied via the basilar. Fetal type origin of the left PCA. Both PCAs patent to their distal aspects without stenosis. Venous sinuses: Dedicated  delayed venous phase images were performed. Normal enhancement seen throughout the superior sagittal sinus anteriorly. Mildly heterogeneous enhancement seen subjacent to the patient's craniotomy defect within the mid-posterior superior sagittal sinus, with no convincing  evidence for dural sinus thrombosis. Superior sagittal sinus patent distally to the torcula. Transverse and sigmoid sinuses are patent as are the jugular bulbs and visualized proximal internal jugular veins. Right transverse sinus dominant. Straight sinus, vein of Galen, internal cerebral veins, and basal veins of Rosenthal are patent. No convincing cortical vein thrombosis, although note is made of some asymmetric filling of cortical veins overlying the right cerebral hemisphere, possibly due to the adjacent subdural collection and edema. Anatomic variants: None significant.  No aneurysm. Review of the MIP images confirms the above findings IMPRESSION: 1. Negative CTA for large vessel occlusion or other emergent finding. 2. Atheromatous change about the carotid bifurcations with associated stenosis of up to 50% on the right and 40% on the left. 3. 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 4. Grossly negative CTV. No convincing evidence for dural sinus thrombosis, although note is made of some heterogeneous filling of cortical veins overlying the right parietal convexity, possibly due to the adjacent subdural collection and edema. These results were communicated to Dr. Leonel Ramsay at 10:11 pm on 10/07/2021 by text page via the [facility] messaging system. Electronically Signed   By: Jeannine Boga M.D.   On: 10/07/2021 22:29   CT HEAD CODE STROKE WO CONTRAST  Result Date: 10/07/2021 CLINICAL DATA:  Code stroke. Initial evaluation for neuro deficit, stroke suspected. EXAM: CT HEAD WITHOUT CONTRAST TECHNIQUE: Contiguous axial images were obtained from the base of the skull through the vertex without intravenous  contrast. RADIATION DOSE REDUCTION: This exam was performed according to the departmental dose-optimization program which includes automated exposure control, adjustment of the mA and/or kV according to patient size and/or use of iterative reconstruction technique. COMPARISON:  Prior MRI and CT from 09/26/2021. FINDINGS: Brain: Cerebral volume within normal limits. 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 There is increased hypodensity at the subcortical right parietal region, new from prior exam, with appearance more typical of vasogenic edema. Finding raises the possibility for an underlying lesion or possibly PRES. No visible acute large vessel territory infarct. No other visible mass lesion. 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 Associated trace 2 mm right-to-left shift. Basilar cisterns remain patent. No hydrocephalus or trapping. No other acute intracranial hemorrhage. Vascular: No abnormal hyperdense vessel. Skull: Prior craniotomy defect noted at the right calvarium. Calvarium otherwise intact. No acute scalp soft tissue abnormality. Sinuses/Orbits: Globes and orbital soft tissues demonstrate no acute finding. Paranasal sinuses are clear. Mastoid air cells are clear. Other: None. ASPECTS [facility] Stroke Program Early CT Score) - Ganglionic level infarction (caudate, lentiform nuclei, internal capsule, insula, M1-M3 cortex): 7 - Supraganglionic infarction (M4-M6 cortex): 3 Total score (0-10 with 10 being normal): 10 IMPRESSION: 1. No definite acute ischemic changes. 2. ASPECTS is 10. 3. 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 4. Increased hypodensity involving the subcortical right parietal lobe, new/progressed as compared to 09/26/2021. Finding is indeterminate, but raises the  possibility for an underlying lesion. Possible PRES could also be considered. Further evaluation with dedicated brain MRI suggested for further evaluation. These results were communicated to Dr. Leonel Ramsay at 9:47 pm on 10/07/2021 by text page via the [facility] messaging system. Results were also discussed by telephone at the time of interpretation on 10/07/2021 at 9:53 pm to provider Dr. Leonel Ramsay. Electronically Signed  By: Jeannine Boga M.D.   On: 10/07/2021 21:54    Review of Systems  Constitutional:  Positive for activity change.  Musculoskeletal:  Positive for gait problem.  Neurological:  Positive for weakness.  All other systems reviewed and are negative.  Blood pressure 113/66, pulse 84, temperature 97.7 F (36.5 C), temperature source Oral, resp. rate 13, height '5\' 8"'$  (1.727 m), weight 79.4 kg, SpO2 94 %. Physical Exam Constitutional:      Appearance: Normal appearance. He is normal weight.  HENT:     Head: Normocephalic.     Comments: Right parietal scalp defect with shiny retracted skin about the 2-1/2 cm area over the right parietal zone likely secondary to radiation changes.    Right Ear: Tympanic membrane normal.     Left Ear: Tympanic membrane normal.     Nose: Nose normal.     Mouth/Throat:     Mouth: Mucous membranes are moist.     Pharynx: Oropharynx is clear.  Eyes:     Extraocular Movements: Extraocular movements intact.     Conjunctiva/sclera: Conjunctivae normal.     Pupils: Pupils are equal, round, and reactive to light.  Neurological:     Mental Status: He is alert.     Comments: Patient is awake and alert speech is fluent pupils are 3 mm and briskly reactive extraocular movements are full face is symmetric to grimace tongue and uvula in the midline.  There is a slight suggestion of some left-sided drift but there is some myoclonic movements in the fingertips when the upper extremities are extended for prolonged period of time is only on the left side.  Lower  extremity strength to confrontational testing appears to be intact deep tendon reflexes are 2+ in the patellae absent in the Achilles Babinski is negative.     Assessment/Plan: Small subdural collection in the right frontoparietal region.  Minimal mass effect.  Plan: Observation.  I am hard put to believe that the small collection is creating significant weakness on the left side that the patient is experiencing.  I believe that other factors such as postradiation effect possible seizure activity may be involved in creating the weakness that the patient is experiencing.  I appreciate the consultation from Dr. Leonel Ramsay and I will follow along clinically but I advised no surgical intervention at this time.  Blanchie Dessert Hunter Chang 10/08/2021, 8:21 AM

## 2021-10-08 NOTE — Procedures (Signed)
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 EEG to evaluate for seizure.  Level of alertness: Awake, asleep  AEDs during EEG study: LEV  Technical aspects: This EEG study was done with scalp electrodes positioned according to the 10-20 International system of electrode placement. Electrical activity was acquired at a sampling rate of '500Hz'$  and reviewed with a high frequency filter of '70Hz'$  and a low frequency filter of '1Hz'$ . EEG data were recorded continuously and digitally stored.   Description: The posterior dominant rhythm consists of 9-10 Hz activity of moderate voltage (25-35 uV) seen predominantly in posterior head regions, symmetric and reactive to eye opening and eye closing. Sleep was characterized by vertex waves, sleep spindles (12 to 14 Hz), maximal frontocentral region.  EEG showed intermittent 3 to 5 Hz theta-delta slowing in right temporo-parietal region.  Hyperventilation and photic stimulation were not performed.     ABNORMALITY - Intermittent slow, right temporo-parietal region  IMPRESSION: This study is suggestive of cortical dysfunction in right temporoparietal region, likely secondary to underlying lesion. No seizures or epileptiform discharges were seen throughout the recording.   Danielle Lento Barbra Sarks

## 2021-10-08 NOTE — H&P (Signed)
History and Physical    PLEASE NOTE THAT DRAGON DICTATION SOFTWARE WAS USED IN THE CONSTRUCTION OF THIS NOTE.   Hunter Chang MWU:[id number] DOB: 09/05/1945 DOA: 10/07/2021  PCP: Binnie Rail, MD  Patient coming from: home   I have personally briefly reviewed patient's old medical records in [place]  Chief Complaint: Follow-up  HPI: Hunter Chang is a 76 y.o. male with medical history significant for trigeminal neuralgia, who is admitted to [facility] on 10/07/2021 with suspected acute physical seizure involving the left lower extremity after presenting from home to [facility] ED complaining of fall.   In the setting of the patient's altered, the following history is provided via my discussions with the patient's wife, who is present at bedside, in addition to my discussions with the EDP, and via chart review.  Per the patient's wife, the patient experienced a fall in the bathroom at home earlier in the day.  While this fall was not directly witnessed by the patient's wife, she heard a loud noise coming from the bathroom, prompting her response, noting that she was into the bathroom within 10 seconds after urine studies, at which time she noted the patient to be laying on the floor, but conscious.  At that time, he reported that he did tripped and fallen, without loss of consciousness, without hitting his head.  At that time he reported some weakness involving his left lower extremity, which is new for him prompting EMS to be contacted.  In route to [facility] emergency department, EMS noted evidence of tonic-clonic movement limited to the left lower extremity, during which the patient was alert and conversant.  Given initial report to acute onset left lower extremity weakness, the patient presented to the emergency department as a code stroke.   Wife conveys the patient has no known history of underlying seizures.  She notes that his medical history is notable for trigeminal neuralgia, for  which his Tegretol was discontinued last week.  Otherwise, he is not on any antiepileptic medications as an outpatient.  Aside from the fall in the bathroom earlier today, wife denies any knowledge of the patient experiencing any recent trauma or fall.  He is on a daily baby aspirin as an outpatient, otherwise on no blood thinners at home.  He was without complaints leading up to the fall in the bathroom earlier today.  The above tonic-clonic activity limited to left lower extremity reportedly lasted for 5 to 10 minutes, before abating with initiation of antiepileptic intervention, as further detailed below.  No ensuing evidence of additional tonic-clonic activity.    ED Course:  Labs were notable for the following: CBC notable for white blood cell count 8300, hemoglobin 13.9.  INR 1.0.  Urinalysis showed white blood cells.  Urine drug screen was pan negative.  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  CTA head and neck as a component of presenting code stroke, showed no evidence of large vessel occlusion or other emergent findings.  EDP discussed patient's case with on-call neurologist, Dr. Leonel Ramsay , Has evaluated the patient and will formally consult.   Patient has received Keppra 2 g IV x1 dose, as well as Ativan 2 mg IV x1, as well as Ativan 1 mg IV x1.  Dr. Leonel Ramsay Reportedly suspects acute focal seizure of the  left lower extremity.  He recommends Keppra 500 mg IV twice daily as well as MRI of the brain in addition to EEG monitoring.  The patient reportedly was at baseline mental status until he received 3 mg of IV Ativan as well as 2 g of IV Keppra.  Additionally, EDP discussed patient's case with Dr.Elsner Of neurosurgery, who conveyed that he will consult formally and see the patient in the morning, and may  no indication for interval neurosurgical intervention.   Subsequently, the patient was admitted for further evaluation and management of suspected acute focal seizure of the left lower extremity in the setting of acute on chronic subdural hematoma.     Review of Systems: As per HPI otherwise 10 point review of systems negative.   Past Medical History:  Diagnosis Date   Arthritis    Chicken pox    [place] measles    Trigeminal neuralgia     Past Surgical History:  Procedure Laterality Date   HERNIA REPAIR     3 times    Social History:  reports that he has never smoked. He has never used smokeless tobacco. He reports that he does not drink alcohol and does not use drugs.   Allergies  Allergen Reactions   Succinylcholine Chloride Other (See Comments)    "Paralyzed my diaphragm"    Family History  Problem Relation Age of Onset   Arthritis Mother    Diabetes Father    Early death Father    Heart disease Father    Heart attack Father    Early death Maternal Grandfather    COPD Paternal Grandfather     Family history reviewed and not pertinent    Prior to Admission medications   Medication Sig Start Date End Date Taking? Authorizing Provider  aspirin 81 MG EC tablet Take 81 mg by mouth daily. Swallow whole.    [provider]  carbamazepine (TEGRETOL-XR) 200 MG 12 hr tablet Take 1 tablet (200 mg total) by mouth 2 (two) times daily. 09/02/21   [name], MD  magnesium gluconate (MAGONATE) 500 MG tablet Take by mouth.    [provider]  Multiple Vitamins-Minerals (ONE-A-DAY MENS 50+ ADVANTAGE) TABS Take 1 tablet by mouth daily.    [provider]  Multiple Vitamins-Minerals (ZINC PO) Take 50 mg by mouth daily.    [provider]  sildenafil (VIAGRA) 100 MG tablet Take 0.5-1 tablets (50-100 mg total) by mouth daily as needed for erectile dysfunction. 04/28/21   Binnie Rail, MD  VITAMIN D PO Take 1 tablet by mouth daily.     [provider]     Objective    Physical Exam: Vitals:   10/08/21 0430 10/08/21 0445 10/08/21 0500 10/08/21 0515  BP: 109/70 102/66 94/65 113/66  Pulse: 76 70 69 84  Resp: '16 17 17 13  '$ Temp:      TempSrc:      SpO2: 93% 95% 93% 94%  Weight:      Height:        General: appears to be stated age; somnolent Skin: warm, dry, no rash Head:  AT/[place] Mouth:  Oral mucosa membranes appear moist, normal dentition Neck: supple; trachea midline Heart:  RRR; did not appreciate any M/R/G Lungs: CTAB, did not appreciate any wheezes, rales, or rhonchi Abdomen: + BS; soft, ND, NT Vascular: 2+ pedal pulses b/l; 2+ radial pulses b/l Extremities: no peripheral edema, no muscle wasting Neuro: In the setting of the patient's current mental status  and associated inability to follow instructions, unable to perform full neurologic exam at this time.  As such, assessment of strength, sensation, and cranial nerves is limited at this time. Patient noted to spontaneously move all 4 extremities. No tremors.     Labs on Admission: I have personally reviewed following labs and imaging studies  CBC: Recent Labs  Lab 10/07/21 2128 10/07/21 2241 10/08/21 0541  WBC 8.3  --  7.4  NEUTROABS 5.8  --  4.8  HGB 13.9 13.9 12.7*  HCT 40.7 41.0 36.6*  MCV 87.3  --  85.9  PLT 350  --  564   Basic Metabolic Panel: Recent Labs  Lab 10/04/21 1227 10/07/21 2128 10/07/21 2241 10/08/21 0541  NA 137 137 137 139  K 3.9 3.6 3.6 3.4*  CL 99 101 102 103  CO2 30 24  --  26  GLUCOSE 94 158* 154* 105*  BUN '12 17 18 13  '$ CREATININE 0.97 1.07 1.10 0.86  CALCIUM 9.9 9.7  --  8.9  MG  --  2.0  --  2.1   GFR: Estimated Creatinine Clearance: 71.8 mL/min (by C-G formula based on SCr of 0.86 mg/dL). Liver Function Tests: Recent Labs  Lab 10/04/21 1227 10/07/21 2128 10/08/21 0541  AST '26 24 20  '$ ALT 41 36 30  ALKPHOS 170* 163* 134*  BILITOT 0.4 0.3 0.4  PROT 7.7 7.3 6.5  ALBUMIN 4.0 3.6 3.0*   No  results for input(s): "LIPASE", "AMYLASE" in the last 168 hours. No results for input(s): "AMMONIA" in the last 168 hours. Coagulation Profile: Recent Labs  Lab 10/07/21 2128  INR 1.0   Cardiac Enzymes: No results for input(s): "CKTOTAL", "CKMB", "CKMBINDEX", "TROPONINI" in the last 168 hours. BNP (last 3 results) No results for input(s): "PROBNP" in the last 8760 hours. HbA1C: No results for input(s): "HGBA1C" in the last 72 hours. CBG: Recent Labs  Lab 10/07/21 2126  GLUCAP 157*   Lipid Profile: No results for input(s): "CHOL", "HDL", "LDLCALC", "TRIG", "CHOLHDL", "LDLDIRECT" in the last 72 hours. Thyroid Function Tests: No results for input(s): "TSH", "T4TOTAL", "FREET4", "T3FREE", "THYROIDAB" in the last 72 hours. Anemia Panel: No results for input(s): "VITAMINB12", "FOLATE", "FERRITIN", "TIBC", "IRON", "RETICCTPCT" in the last 72 hours. Urine analysis:    Component Value Date/Time   COLORURINE YELLOW 10/08/2021 0403   APPEARANCEUR CLEAR 10/08/2021 0403   LABSPEC >1.046 (H) 10/08/2021 0403   PHURINE 5.0 10/08/2021 0403   GLUCOSEU NEGATIVE 10/08/2021 0403   HGBUR NEGATIVE 10/08/2021 0403   BILIRUBINUR NEGATIVE 10/08/2021 0403   KETONESUR NEGATIVE 10/08/2021 0403   PROTEINUR NEGATIVE 10/08/2021 0403   NITRITE NEGATIVE 10/08/2021 0403   LEUKOCYTESUR NEGATIVE 10/08/2021 0403    Radiological Exams on Admission: CT ANGIO HEAD NECK W WO CM (CODE STROKE)  Result Date: 10/07/2021 CLINICAL DATA:  Initial evaluation for neuro deficit, stroke suspected. EXAM: CT ANGIOGRAPHY HEAD AND NECK TECHNIQUE: Multidetector CT imaging of the head and neck was performed using the standard protocol during bolus administration of intravenous contrast. Multiplanar CT image reconstructions and MIPs were obtained to evaluate the vascular anatomy. Carotid stenosis measurements (when applicable) are obtained utilizing NASCET criteria, using the distal internal carotid diameter as the denominator.  RADIATION DOSE REDUCTION: This exam was performed according to the departmental dose-optimization program which includes automated exposure control, adjustment of the mA and/or kV according to patient size and/or use of iterative reconstruction technique. CONTRAST:  96m OMNIPAQUE IOHEXOL 350 MG/ML SOLN COMPARISON:  Prior CT from earlier the same  day as well as earlier exams. FINDINGS: CTA NECK FINDINGS Aortic arch: Visualized aortic arch normal caliber with standard 3 vessel morphology. No stenosis about the origin the great vessels. Right carotid system: Right common carotid artery widely patent. Eccentric mixed plaque about the proximal cervical right ICA with associated stenosis of up to 50% by NASCET criteria. Right ICA patent distally without stenosis or dissection. Left carotid system: Left CCA patent from its origin to the bifurcation without stenosis. Eccentric plaque about the proximal cervical left ICA with associated stenosis of up to 40% by NASCET criteria. Left ICA patent distally without stenosis or dissection. Vertebral arteries: Both vertebral arteries arise from the subclavian arteries. No proximal subclavian artery stenosis. Atheromatous change at the origins of both vertebral arteries with associated moderate to severe ostial stenosis on the left. Vertebral arteries otherwise patent distally without stenosis or dissection. Right vertebral artery slightly dominant. Skeleton: No discrete or worrisome osseous lesions. Reversal of the normal cervical lordosis with moderate spondylosis at C6-7. Other neck: No other acute soft tissue abnormality within the neck. Upper chest: Visualized upper chest demonstrates no other acute finding. Review of the MIP images confirms the above findings CTA HEAD FINDINGS Anterior circulation: Petrous segments widely patent. Mild for age atheromatous change within the carotid siphons without stenosis. A1 segments patent. Normal anterior compared to complex. Anterior  cerebral arteries patent without stenosis. No M1 stenosis or occlusion. No proximal MCA branch occlusion. Distal MCA branches perfused and symmetric. Posterior circulation: Both vertebral arteries patent without stenosis. Both PICA patent. Basilar patent to its distal aspect without stenosis. Superior cerebral arteries patent bilaterally. Right PCA supplied via the basilar. Fetal type origin of the left PCA. Both PCAs patent to their distal aspects without stenosis. Venous sinuses: Dedicated delayed venous phase images were performed. Normal enhancement seen throughout the superior sagittal sinus anteriorly. Mildly heterogeneous enhancement seen subjacent to the patient's craniotomy defect within the mid-posterior superior sagittal sinus, with no convincing evidence for dural sinus thrombosis. Superior sagittal sinus patent distally to the torcula. Transverse and sigmoid sinuses are patent as are the jugular bulbs and visualized proximal internal jugular veins. Right transverse sinus dominant. Straight sinus, vein of Galen, internal cerebral veins, and basal veins of Rosenthal are patent. No convincing cortical vein thrombosis, although note is made of some asymmetric filling of cortical veins overlying the right cerebral hemisphere, possibly due to the adjacent subdural collection and edema. Anatomic variants: None significant.  No aneurysm. Review of the MIP images confirms the above findings IMPRESSION: 1. Negative CTA for large vessel occlusion or other emergent finding. 2. Atheromatous change about the carotid bifurcations with associated stenosis of up to 50% on the right and 40% on the left. 3. 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 4. Grossly negative CTV. No convincing evidence for dural sinus thrombosis, although note is made of some heterogeneous filling of cortical veins overlying the right parietal convexity, possibly due to the adjacent subdural collection and  edema. These results were communicated to Dr. Leonel Ramsay at 10:11 pm on 10/07/2021 by text page via the [facility] messaging system. Electronically Signed   By: Jeannine Boga M.D.   On: 10/07/2021 22:29   CT HEAD CODE STROKE WO CONTRAST  Result Date: 10/07/2021 CLINICAL DATA:  Code stroke. Initial evaluation for neuro deficit, stroke suspected. EXAM: CT HEAD WITHOUT CONTRAST TECHNIQUE: Contiguous axial images were obtained from the base of the skull through the vertex without intravenous contrast. RADIATION DOSE REDUCTION:  This exam was performed according to the departmental dose-optimization program which includes automated exposure control, adjustment of the mA and/or kV according to patient size and/or use of iterative reconstruction technique. COMPARISON:  Prior MRI and CT from 09/26/2021. FINDINGS: Brain: Cerebral volume within normal limits. 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 There is increased hypodensity at the subcortical right parietal region, new from prior exam, with appearance more typical of vasogenic edema. Finding raises the possibility for an underlying lesion or possibly PRES. No visible acute large vessel territory infarct. No other visible mass lesion. 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 Associated trace 2 mm right-to-left shift. Basilar cisterns remain patent. No hydrocephalus or trapping. No other acute intracranial hemorrhage. Vascular: No abnormal hyperdense vessel. Skull: Prior craniotomy defect noted at the right calvarium. Calvarium otherwise intact. No acute scalp soft tissue abnormality. Sinuses/Orbits: Globes and orbital soft tissues demonstrate no acute finding. Paranasal sinuses are clear. Mastoid air cells are clear. Other: None. ASPECTS [facility] Stroke Program Early CT Score) - Ganglionic level infarction  (caudate, lentiform nuclei, internal capsule, insula, M1-M3 cortex): 7 - Supraganglionic infarction (M4-M6 cortex): 3 Total score (0-10 with 10 being normal): 10 IMPRESSION: 1. No definite acute ischemic changes. 2. ASPECTS is 10. 3. 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 4. Increased hypodensity involving the subcortical right parietal lobe, new/progressed as compared to 09/26/2021. Finding is indeterminate, but raises the possibility for an underlying lesion. Possible PRES could also be considered. Further evaluation with dedicated brain MRI suggested for further evaluation. These results were communicated to Dr. Leonel Ramsay at 9:47 pm on 10/07/2021 by text page via the [facility] messaging system. Results were also discussed by telephone at the time of interpretation on 10/07/2021 at 9:53 pm to provider Dr. Leonel Ramsay. Electronically Signed   By: Jeannine Boga M.D.   On: 10/07/2021 21:54      Assessment/Plan   Principal Problem:   Seizures ([place]) Active Problems:   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HCC)   Acute encephalopathy     #) Acute focal seizure involving left lower extremity: Tonic-clonic activity limited to the left lower extremity, lasting for 5 to 10 minutes before abating following doses of IV Ativan and 2 g of IV Keppra.  Neurology formally consulted, suspecting acute focal seizure as a consequence of acute on chronic subdural hematoma identified on presenting CT head.  Neurology also recommends MRI brain as well as Keppra 500 mg IV twice daily, which has been ordered, as well as EEG monitoring.  No known history of underlying seizures.  Of note, outpatient Tegretol in the setting of trigeminal neuralgia was discontinued last week.  Plan: Neurology consult, as above.  Keppra 500 mg IV twice daily.  EEG monitoring.  MRI brain, with result currently pending.        #(Acute on chronic  subdural hematoma: Identified on today's CT head, in the setting of reported fall just prior to presenting to the hospital, noting that the patient is on daily baby aspirin as an outpatient, otherwise no additional blood thinners.  Case discussed with on-call neurosurgery, who will formally consult and see the patient in the morning, conveying no interval indication for neurosurgical intervention, as detailed above.   Plan: Serial neurochecks.  Neurosurgery consulted, as above.  Hold him daily baby aspirin.  Repeat CBC  in the morning.  Further evaluation and management of suspected presenting acute febrile seizure, as above.  Fall precautions ordered.       #) Acute encephalopathy: Suspect toxic encephalopathy in the setting of 3 mg of IV Ativan versus receipt of 2 grams of IV Keppra.  Less likely to be representative of postictal state given the suspected focal nature of the patient's seizure that is limited to the left lower extremity.  However,  with further assess via EEG monitoring that has been ordered by neurology.  Differential also includes the possibility of evolving acute on chronic subdural hematoma, although initial CT head showed minimal associated shift. no overt evidence of underlying action, including UA that was not suggestive of UTI.  Will check chest x-ray to further assess.  UDS pan negative.  Plan: Further evaluation management of acute focal seizure as well as acute on chronic subdural hematoma, as above.  All precautions ordered.  Serial neurochecks.  Neurology and neurosurgery consulted, as above.  Repeat CMP and CBC in the morning.  Check chest x-ray.  N.p.o. for now.       DVT prophylaxis: SCD's   Code Status: Full code Family Communication: I discussed the patient's case with his wife, who is present at bedside Disposition Plan: Per Rounding Team Consults called: Neurology and neurosurgery, as further detailed above;  Admission status: Inpatient   PLEASE NOTE THAT  DRAGON DICTATION SOFTWARE WAS USED IN THE CONSTRUCTION OF THIS NOTE.   [place] DO [facility]  From [place]   10/08/2021, 6:58 AM

## 2021-10-08 NOTE — Telephone Encounter (Signed)
Received vm message from pt's wife.  She states that her husband has been admitted to [facility] as of 10/07/21 with what they thought might be a stroke. She states that there are other things going on and he will be in the hospital for a few days. She is asking if the MRIs of the cervical and thoracic spine can be done while he is an in-patient.

## 2021-10-08 NOTE — Progress Notes (Signed)
? ?  Inpatient Rehab Admissions Coordinator : ? ?Per therapy recommendations, patient was screened for CIR candidacy by Denna Fryberger RN MSN.  At this time patient appears to be a potential candidate for CIR. I will place a rehab consult per protocol for full assessment. Please call me with any questions. ? ?Kamden Reber RN MSN ?Admissions Coordinator ?[phone] ?  ?

## 2021-10-08 NOTE — Consult Note (Signed)
Neurology Consultation Reason for Consult: Left-sided weakness Referring Physician: Ralene Bathe, E  CC: Left-sided weakness  History is obtained from: Patient  HPI: GERADO NABERS is a 76 y.o. male who had an episode that was described as bilateral leg weakness about a week ago.  He was worked up with CTA and MRI at the time which was negative, given that it was bilateral symptoms of unclear etiology, was felt unlikely to be TIA and given that he was resolved with negative work-up he was discharged for outpatient follow-up.  Today, he had left-sided weakness it was clearly unilateral, starting with left-sided numbness then progressing to weakness.  He was brought into the emergency department as a code stroke, and on arrival it was noted that he had persistent clonic activity of the left leg.  Despite this, he was wide-awake with no change in mentation.    ROS: A 14 point ROS was performed and is negative except as noted in the HPI.   Past Medical History:  Diagnosis Date   Arthritis    Chicken pox    [place] measles      Family History  Problem Relation Age of Onset   Arthritis Mother    Diabetes Father    Early death Father    Heart disease Father    Heart attack Father    Early death Maternal Grandfather    COPD Paternal Grandfather      Social History:  reports that he has never smoked. He has never used smokeless tobacco. He reports that he does not drink alcohol and does not use drugs.   Exam: Current vital signs: BP 108/82   Pulse 71   Temp 97.7 F (36.5 C) (Oral)   Resp 16   Ht '5\' 8"'$  (1.727 m)   Wt 79.4 kg   SpO2 95%   BMI 26.61 kg/m  Vital signs in last 24 hours: Temp:  [97.7 F (36.5 C)] 97.7 F (36.5 C) (07/13 2206) Pulse Rate:  [67-102] 71 (07/14 0100) Resp:  [15-25] 16 (07/14 0100) BP: (98-135)/(61-82) 108/82 (07/14 0100) SpO2:  [94 %-98 %] 95 % (07/14 0100) Weight:  [79.4 kg-83.2 kg] 79.4 kg (07/14 0111)   Physical Exam  Constitutional: Appears  well-developed and well-nourished.  Psych: Affect appropriate to situation Eyes: No scleral injection HENT: No OP obstruction MSK: no joint deformities.  Cardiovascular: Normal rate and regular rhythm.  Respiratory: Effort normal, non-labored breathing GI: Soft.  No distension. There is no tenderness.  Skin: WDI  Neuro: Mental Status: Patient is awake, alert, oriented to person, place, month, year, and situation. Patient is able to give a clear and coherent history. No signs of aphasia or neglect Cranial Nerves: II: Visual Fields are full. Pupils are equal, round, and reactive to light.   III,IV, VI: EOMI without ptosis or diploplia.  VII: Facial movement with left sided weakness VIII: hearing is intact to voice X: Uvula elevates symmetrically XI: Shoulder shrug is symmetric. XII: tongue is midline without atrophy or fasciculations.  Motor: He is able to lift both the arm and leg against gravity but he does have some drift. Sensory: Sensation is completely absent throughout the left side Cerebellar: No clear ataxia in the right  He has persistent clearly clonic activity of the left lower extremity that reduced in amplitude after 2 mg of IV Ativan, and then finally ceased after an additional 1 mg.   I have reviewed labs in epic and the results pertinent to this consultation are: Creatinine 1.07  I have reviewed the images obtained: CT-tiny subdural hematoma overlying the hygroma.  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  The edema is unusual, especially given how fast it developed.  Tumor related edema sometimes can develop this quickly, and there is significant T2 change on the previous scan that was  attributed to nonspecific T2 change, but without contrast there could be a small tumor that is not clearly distinguishable there.  Posterior reversible encephalopathy syndrome (PRES) is also possible.  He has a history of trigeminal neuralgia, and recently stopped Tegretol as it was not helping.  Tegretol cessation is not typically problematic, but if he did have some mild underlying seizure predisposition, certainly he is within the time window for this to be lowering his seizure threshold as well.  Recommendations: 1) MRI brain with and without contrast 2) Keppra 500 mg twice daily after 2 g load 3) EEG 4) further recommendations following the above testing.   Roland Rack, MD [facility] [phone]  If 7pm- 7am, please page neurology on call as listed in [place].

## 2021-10-09 ENCOUNTER — Ambulatory Visit (HOSPITAL_COMMUNITY): Payer: PPO

## 2021-10-09 ENCOUNTER — Encounter (HOSPITAL_COMMUNITY): Payer: Self-pay

## 2021-10-09 ENCOUNTER — Ambulatory Visit (HOSPITAL_COMMUNITY): Admission: RE | Admit: 2021-10-09 | Payer: PPO | Source: Ambulatory Visit

## 2021-10-09 DIAGNOSIS — R569 Unspecified convulsions: Secondary | ICD-10-CM | POA: Diagnosis not present

## 2021-10-09 MED ORDER — OXYCODONE HCL 5 MG PO TABS: 5.0000 mg | ORAL_TABLET | ORAL | Status: DC | PRN

## 2021-10-09 MED ORDER — GUAIFENESIN 100 MG/5ML PO LIQD: 5.0000 mL | ORAL | Status: DC | PRN

## 2021-10-09 MED ORDER — IPRATROPIUM-ALBUTEROL 0.5-2.5 (3) MG/3ML IN SOLN: 3.0000 mL | RESPIRATORY_TRACT | Status: DC | PRN

## 2021-10-09 MED ORDER — TRAZODONE HCL 50 MG PO TABS: 50.0000 mg | ORAL_TABLET | Freq: Every evening | ORAL | Status: DC | PRN

## 2021-10-09 MED ORDER — SENNOSIDES-DOCUSATE SODIUM 8.6-50 MG PO TABS: 1.0000 | ORAL_TABLET | Freq: Every evening | ORAL | Status: DC | PRN

## 2021-10-09 MED ORDER — HYDRALAZINE HCL 20 MG/ML IJ SOLN: 10.0000 mg | INTRAMUSCULAR | Status: DC | PRN

## 2021-10-09 MED ORDER — METOPROLOL TARTRATE 5 MG/5ML IV SOLN: 5.0000 mg | INTRAVENOUS | Status: DC | PRN

## 2021-10-09 NOTE — Progress Notes (Addendum)
Patient seen and examined.  Remains asymptomatic from seizure perspective. I have discussed neuroimaging findings with neuro rads with details listed in the consult yesterday. Plan is to continue steroids for now as well as antiepileptics. Spinal tap Monday with cytology and flow cytometry to be sent. Will be discussed in tumor board on Monday.  Dr. Mickeal Skinner aware. Consider dural sampling as well. Discussed the utility of MR spectroscopy with neurology who do not feel that would be much beneficial since not much of that imaging is done in the center. Dr. Mickeal Skinner had thought of C-spine imaging because the brain imaging with his findings on clinical exam early in June would not localizing but now with the expansile edema in the right hemisphere, I do not think we need to image spine for any emergent reason. I will continue to follow. Plan discussed with Dr. Reesa Chew  -- Amie Portland, MD Neurologist [facility] Pager: [phone]

## 2021-10-09 NOTE — Evaluation (Addendum)
Physical Therapy Evaluation Patient Details Name: Hunter Chang MRN: [id number] DOB: Mar 06, 1946 Today's Date: 10/09/2021  History of Present Illness  76 y.o. male presents to [facility] on 10/07/2021 with suspected acute seizure involving his LLE after having a fall at home. EEG performed 10/08/2021 that suggests cortical dysfunction in R temporoparietal region. PMH: arthritis, trigeminal neuralgia  Clinical Impression  Pt presents with impairments in strength (especially LLE), balance, gait, and coordination. Pt tolerates therapy well today, ambulating household distances with an AD. Pt attempts ambulation without an AD to walk to bathroom, but has notable LOB while doing so. Due to his impairments, the pt is currently at an increased risk of falls. PT recommends AIR placement upon discharge for continued therapy to increase functional independence and decrease caregiver burden. Continued therapy will assist the pt in improving his strength, balance, and coordination for safer gait and decreased risk of falling.     Recommendations for follow up therapy are one component of a multi-disciplinary discharge planning process, led by the attending physician.  Recommendations may be updated based on patient status, additional functional criteria and insurance authorization.  Follow Up Recommendations Acute inpatient rehab (3hours/day)      Assistance Recommended at Discharge Frequent or constant Supervision/Assistance  Patient can return home with the following  A little help with walking and/or transfers;A little help with bathing/dressing/bathroom;Assistance with cooking/housework;Direct supervision/assist for medications management;Assist for transportation;Help with stairs or ramp for entrance    Equipment Recommendations Rolling walker (2 wheels) (Pending confirmation on if he already has one)  Recommendations for Other Services       Functional Status Assessment Patient has had a recent  decline in their functional status and demonstrates the ability to make significant improvements in function in a reasonable and predictable amount of time.     Precautions / Restrictions Precautions Precautions: Fall Restrictions Weight Bearing Restrictions: No      Mobility  Bed Mobility Overal bed mobility: Modified Independent Bed Mobility: Supine to Sit     Supine to sit: Modified independent (Device/Increase time)          Transfers Overall transfer level: Needs assistance Equipment used: None, Rolling walker (2 wheels) (Transfers with RW before ambulating in hall, transfers with no AD before ambulating to bathroom) Transfers: Sit to/from Stand Sit to Stand: Supervision, Min guard (Min guard initially for safety as pt was unsteady; was fairly shaky in standing and required close supervision for remainder of standing transfers)           General transfer comment: PT gives verbal cues for hand placement, but pt ignores them    Ambulation/Gait Ambulation/Gait assistance: Min guard Gait Distance (Feet): 120 Feet Assistive device: Rolling walker (2 wheels), None (RW in hall; none in room to bathroom) Gait Pattern/deviations: Step-through pattern, Decreased stride length, Trunk flexed Gait velocity: decreased Gait velocity interpretation: <1.8 ft/sec, indicate of risk for recurrent falls   General Gait Details: Pt has decreased safety awareness when turning with RW; when ambulating without AD, pt has one posterior LOB despite being persistent that he can walk without one; pt also uses walls for balance while in bathroom; generally unsteady throughout with short step length  Stairs            Wheelchair Mobility    Modified Rankin (Stroke Patients Only)       Balance Overall balance assessment: Needs assistance Sitting-balance support: No upper extremity supported, Feet supported Sitting balance-Leahy Scale: Good     Standing balance support:  Bilateral  upper extremity supported, During functional activity, Reliant on assistive device for balance, No upper extremity supported Standing balance-Leahy Scale: Poor Standing balance comment: Pt with posterior LOB during static standing at toilet without AD                             Pertinent Vitals/Pain Pain Assessment Pain Assessment: No/denies pain    Home Living Family/patient expects to be discharged to:: Inpatient rehab Living Arrangements: Spouse/significant other Available Help at Discharge: Family;Available 24 hours/day Type of Home: House Home Access: Level entry       Home Layout: One level Home Equipment: Rollator (4 wheels);Rolling Walker (2 wheels) (Based off OT's note; pt doesn't report these when asked about them but may have impaired cognition currently) Additional Comments: Wife in agreeance with plan for CIR; will update family with discharge recommendations pending pt progression    Prior Function Prior Level of Function : Independent/Modified Independent;Driving             Mobility Comments: No AD at baseline ADLs Comments: Ind at baseline     Hand Dominance        Extremity/Trunk Assessment   Upper Extremity Assessment Upper Extremity Assessment: Generalized weakness    Lower Extremity Assessment Lower Extremity Assessment: Generalized weakness;LLE deficits/detail LLE Deficits / Details: B weakness but L is weaker (4-/5) LLE Sensation: WNL LLE Coordination: WNL    Cervical / Trunk Assessment Cervical / Trunk Assessment: Kyphotic  Communication   Communication: No difficulties  Cognition Arousal/Alertness: Awake/alert Behavior During Therapy: WFL for tasks assessed/performed Overall Cognitive Status: No family/caregiver present to determine baseline cognitive functioning Area of Impairment: Following commands, Awareness, Safety/judgement, Problem solving                       Following Commands: Follows one step  commands inconsistently (Pt has difficulty sequencing movements during MMT and requires tactile cues) Safety/Judgement: Decreased awareness of safety, Decreased awareness of deficits Awareness: Intellectual (Pt reports he doesn't think he would have any issues ambulating without AD, but stumbles while doing so in bathroom) Problem Solving: Decreased initiation, Difficulty sequencing, Requires verbal cues General Comments: Also reports that his wife is able to provide physical assistance if necessary, but she was not there to confirm this        General Comments General comments (skin integrity, edema, etc.): VSS on RA; Pt reports feeling woozy at beginning of session, but it never gets worse during the session    Exercises     Assessment/Plan    PT Assessment Patient needs continued PT services  PT Problem List Decreased strength;Decreased balance;Decreased mobility;Decreased coordination;Decreased cognition;Decreased knowledge of use of DME;Decreased safety awareness       PT Treatment Interventions DME instruction;Gait training;Stair training;Functional mobility training;Therapeutic activities;Therapeutic exercise;Balance training;Patient/family education    PT Goals (Current goals can be found in the Care Plan section)  Acute Rehab PT Goals Patient Stated Goal: "Return to playing tennis and golf" PT Goal Formulation: With patient Time For Goal Achievement: 10/23/21 Potential to Achieve Goals: Good    Frequency Min 3X/week     Co-evaluation               AM-PAC PT "6 Clicks" Mobility  Outcome Measure Help needed turning from your back to your side while in a flat bed without using bedrails?: None Help needed moving from lying on your back to sitting on the side of a  flat bed without using bedrails?: None Help needed moving to and from a bed to a chair (including a wheelchair)?: A Little Help needed standing up from a chair using your arms (e.g., wheelchair or bedside  chair)?: A Little Help needed to walk in hospital room?: A Little Help needed climbing 3-5 steps with a railing? : A Lot 6 Click Score: 19    End of Session Equipment Utilized During Treatment: Gait belt Activity Tolerance: Patient tolerated treatment well Patient left: in chair;with chair alarm set;with call bell/phone within reach Nurse Communication: Mobility status PT Visit Diagnosis: Unsteadiness on feet (R26.81);Other abnormalities of gait and mobility (R26.89);Muscle weakness (generalized) (M62.81);History of falling (Z91.81)    Time: 1204-1228 PT Time Calculation (min) (ACUTE ONLY): 24 min   Charges:   PT Evaluation $PT Eval Moderate Complexity: 1 Mod PT Treatments $Gait Training: 8-22 mins        Hall Busing, SPT Acute Rehabilitation Office #: [phone]   Hall Busing 10/09/2021, 1:34 PM

## 2021-10-09 NOTE — Progress Notes (Signed)
Patient ID: Hunter Chang, male   DOB: 1946/02/14, 77 y.o.   MRN: [id number] Vital signs are stable Patient is awake and alert Left-sided weakness appears essentially unchanged I have reviewed the MRI which demonstrates involvement of the cortex with some edematous changes and what I fear may be some radiation changes Patient and his family are concerned about the potential for there being tumor as this was noted in the report Dr. Mickeal Skinner has wanted an MRI of the thoracic and cervical spines and the family is asking when this will be done He was to be ordered as an outpatient We can obtain an MRI of the cervical and thoracic spine now though I believe the intracranial findings explain the reason for the weakness on the left side Patient had seen a surgeon regarding his scalp in the past and he is being followed by that person I have some concern for coverage and closure of the exposed area.  It is uncertain whether the physician following him is a Psychiatric nurse. I will follow-up with patient on Monday after multidisciplinary brain tumor conference.

## 2021-10-09 NOTE — Progress Notes (Signed)
PROGRESS NOTE    Hunter Chang  GUR:[id number] DOB: 28-Nov-1945 DOA: 10/07/2021 PCP: Binnie Rail, MD   Brief Narrative:  76 year old with history of trigeminal neuralgia to the hospital for acute focal seizure of left lower extremity at home.  CTA head and MRI brain were negative during recent admission.  Admitted for presumed seizures, neurology team was consulted.  EEG was started on the patient.   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  Previous provider discussed with neurosurgery, no further intervention at this time. - EEG-suggestive of cortical dysfunction in the right temporoparietal lobe area with no obvious seizures - Neurology-discussed case with Dr. Mickeal Skinner.  Case will be discussed at tumor board on Monday, high-dose dexamethasone.  Will need lumbar puncture on Monday. - Keppra 500 mg twice daily  Acute metabolic encephalopathy - Resolved  Hypokalemia - Replete as necessary      DVT prophylaxis: SCDs Start: 10/07/21 2338 Code Status: Full Code Family Communication:  Friend at bedside.   Status is: Inpatient Remains inpatient appropriate because: Plans for LP on Monday and case being presented at Tumor board by NeuroOnc. Maintain hosp stay      Subjective: Feels ok no complaints overall at this time.    Examination:  General exam: Appears calm and comfortable  Respiratory system: Clear to auscultation. Respiratory effort normal. Cardiovascular system: S1 & S2 heard, RRR. No JVD, murmurs, rubs, gallops or clicks. No pedal edema. Gastrointestinal system: Abdomen is nondistended, soft and nontender. No  organomegaly or masses felt. Normal bowel sounds heard. Central nervous system: Alert and oriented. No focal neurological deficits. Extremities: Symmetric 5 x 5 power. Skin: No rashes, lesions or ulcers Psychiatry: Judgement and insight appear normal. Mood & affect appropriate.     Objective: Vitals:   10/08/21 2346 10/09/21 0330 10/09/21 0510 10/09/21 0806  BP: (!) 114/56 129/68  123/84  Pulse: 88 86  86  Resp: '20 16  20  '$ Temp: 98.8 F (37.1 C) 98.2 F (36.8 C)  (!) 97.5 F (36.4 C)  TempSrc: Oral Oral  Oral  SpO2: 99% 96%  96%  Weight:   76.5 kg   Height:       No intake or output data in the 24 hours ending 10/09/21 0916 Filed Weights   10/07/21 2131 10/08/21 0111 10/09/21 0510  Weight: 83.2 kg 79.4 kg 76.5 kg     Data Reviewed:   CBC: Recent Labs  Lab 10/07/21 2128 10/07/21 2241 10/08/21 0541  WBC 8.3  --  7.4  NEUTROABS 5.8  --  4.8  HGB 13.9 13.9 12.7*  HCT 40.7 41.0 36.6*  MCV 87.3  --  85.9  PLT 350  --  283   Basic Metabolic Panel: Recent Labs  Lab 10/04/21 1227 10/07/21 2128 10/07/21 2241 10/08/21 0541  NA 137 137 137 139  K 3.9 3.6 3.6 3.4*  CL 99 101 102 103  CO2 30 24  --  26  GLUCOSE 94 158* 154* 105*  BUN '12 17 18 13  '$ CREATININE 0.97 1.07 1.10 0.86  CALCIUM 9.9 9.7  --  8.9  MG  --  2.0  --  2.1   GFR: Estimated Creatinine Clearance: 71.8 mL/min (by C-G formula based on SCr of 0.86 mg/dL). Liver Function Tests: Recent Labs  Lab 10/04/21 1227 10/07/21 2128 10/08/21 0541  AST '26 24 20  '$ ALT 41 36 30  ALKPHOS 170* 163* 134*  BILITOT 0.4 0.3 0.4  PROT 7.7 7.3 6.5  ALBUMIN 4.0 3.6 3.0*   No results for input(s): "LIPASE", "AMYLASE" in the last 168 hours. No results for input(s): "AMMONIA" in the last 168 hours. Coagulation Profile: Recent Labs  Lab 10/07/21 2128  INR 1.0   Cardiac Enzymes: No results for input(s): "CKTOTAL", "CKMB", "CKMBINDEX", "TROPONINI" in the last 168 hours. BNP (last 3 results) No results for  input(s): "PROBNP" in the last 8760 hours. HbA1C: No results for input(s): "HGBA1C" in the last 72 hours. CBG: Recent Labs  Lab 10/07/21 2126  GLUCAP 157*   Lipid Profile: No results for input(s): "CHOL", "HDL", "LDLCALC", "TRIG", "CHOLHDL", "LDLDIRECT" in the last 72 hours. Thyroid Function Tests: No results for input(s): "TSH", "T4TOTAL", "FREET4", "T3FREE", "THYROIDAB" in the last 72 hours. Anemia Panel: No results for input(s): "VITAMINB12", "FOLATE", "FERRITIN", "TIBC", "IRON", "RETICCTPCT" in the last 72 hours. Sepsis Labs: No results for input(s): "PROCALCITON", "LATICACIDVEN" in the last 168 hours.  Recent Results (from the past 240 hour(s))  Resp Panel by RT-PCR (Flu A&B, Covid) Anterior Nasal Swab     Status: None   Collection Time: 10/07/21  9:26 PM   Specimen: Anterior Nasal Swab  Result Value Ref Range Status   SARS Coronavirus 2 by RT PCR NEGATIVE NEGATIVE Final    Comment: (NOTE) SARS-CoV-2 target nucleic acids are NOT DETECTED.  The SARS-CoV-2 RNA is generally detectable in upper respiratory specimens during the acute phase of infection. The lowest concentration of SARS-CoV-2 viral copies this assay can detect is 138 copies/mL. A negative result does not preclude SARS-Cov-2 infection and should not be used as the sole basis for treatment or other patient management decisions. A negative result may occur with  improper specimen collection/handling, submission of specimen other than nasopharyngeal swab, presence of viral mutation(s) within the areas targeted by this assay, and inadequate number of viral copies(<138 copies/mL). A negative result must be combined with clinical observations, patient history, and epidemiological information. The expected result is Negative.  Fact Sheet for Patients:  EntrepreneurPulse.com.au  Fact Sheet for Healthcare Providers:  IncredibleEmployment.be  This test is no t yet approved or  cleared by the [place] FDA and  has been authorized for detection and/or diagnosis of SARS-CoV-2 by FDA under an Emergency Use Authorization (EUA). This EUA will remain  in effect (meaning this test can be used) for the duration of the COVID-19 declaration under Section 564(b)(1) of the Act, 21 U.S.C.section 360bbb-3(b)(1), unless the authorization is terminated  or revoked sooner.       Influenza A by PCR NEGATIVE NEGATIVE Final   Influenza B by PCR NEGATIVE NEGATIVE Final    Comment: (NOTE) The Xpert Xpress SARS-CoV-2/FLU/RSV plus assay is intended as an aid in the diagnosis of influenza from Nasopharyngeal swab specimens and should not be used as a sole basis for treatment. Nasal washings and aspirates are unacceptable for Xpert Xpress SARS-CoV-2/FLU/RSV testing.  Fact Sheet for Patients: EntrepreneurPulse.com.au  Fact Sheet for Healthcare Providers: IncredibleEmployment.be  This test is not yet approved or cleared by the [place] FDA and has been authorized for detection and/or diagnosis of SARS-CoV-2 by FDA under an Emergency Use Authorization (EUA). This EUA will remain in effect (meaning  this test can be used) for the duration of the COVID-19 declaration under Section 564(b)(1) of the Act, 21 U.S.C. section 360bbb-3(b)(1), unless the authorization is terminated or revoked.  Performed at [facility], [place] [facility] [address], [place], [place] [address]          Radiology Studies: MR BRAIN W WO CONTRAST  Result Date: 10/08/2021 CLINICAL DATA:  Seizure, new-onset. Per chart review, reportedly had a fall and has H/O SCALP TUMOR REMOVAL WITH RADIATION EXAM: MRI HEAD WITHOUT AND WITH CONTRAST TECHNIQUE: Multiplanar, multiecho pulse sequences of the brain and surrounding structures were obtained without and with intravenous contrast. CONTRAST:  58m GADAVIST GADOBUTROL 1 MMOL/ML IV SOLN COMPARISON:  MRI 09/26/2021. FINDINGS:  Motion limited. Brain: Similar versus mildly increased size of a right cerebral convexity extra-axial fluid collection which measures up to 12 mm superiorly, probably largely due to redistribution. Mass effect is similar with approximately 2-3 mm of leftward midline shift. Interval rapid development of expansile edema in the subcortical right parietal lobe with irregular overlying dural enhancement subjacent to prior scalp/postsurgical change. No evidence of acute hemorrhage, acute infarct, or hydrocephalus. Additional scattered T2/FLAIR hyperintensities in the white matter nonspecific but compatible with chronic microvascular ischemic disease. Vascular: Major arterial flow voids are maintained at the skull base. Skull and upper cervical spine: Normal marrow signal. Sinuses/Orbits: Clear sinuses.  Unremarkable orbits. Other: No mastoid effusions. IMPRESSION: 1. Interval rapid development of expansile edema in the subcortical right parietal lobe with irregular overlying dural enhancement subjacent to prior scalp/calvarial postsurgical change. 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 Encephalitis is a differential consideration. The adjacent superior sagittal sinus is small, but appears patent making dural venous sinus thrombosis unlikely. A follow-up MRI after resolution of the patient's subdural hematoma may be helpful to ensure that findings are not reactive given adjacent hematoma. 2. Similar versus mildly increased size of a right cerebral convexity subdural hematoma which measures up to 12 mm superiorly, probably largely due to redistribution. Mass effect is similar with approximately 2-3 mm of leftward midline shift. Findings discussed with Dr. PAlvino Chapelvia telephone at 11:30 AM. Electronically Signed    By: FMargaretha SheffieldM.D.   On: 10/08/2021 11:36   EEG adult  Result Date: 10/08/2021 YLora Havens MD     10/08/2021  8:44 AM Patient Name: Hunter HILLMERMRN: [id number] Attending: PLora HavensReferring Physician/Provider: KGreta Doom MD Date: 10/08/2021 Duration: 26.43 mins Patient history:  76year old male with new onset seizures in the setting of right-sided hygroma with tiny amount of acute blood, as well as what appears to be new vasogenic edema of the right parietal region. EEG to evaluate for seizure. Level of alertness: Awake, asleep AEDs during EEG study: LEV Technical aspects: This EEG study was done with scalp electrodes positioned according to the 10-20 International system of electrode placement. Electrical activity was acquired at a sampling rate of '500Hz'$  and reviewed with a high frequency filter of '70Hz'$  and a low frequency filter of '1Hz'$ . EEG data were recorded continuously and digitally stored. Description: The posterior dominant rhythm consists of 9-10 Hz activity of moderate voltage (25-35 uV) seen predominantly in posterior head regions, symmetric and reactive to eye opening and eye closing. Sleep was characterized by vertex waves, sleep spindles (12 to 14 Hz), maximal frontocentral region.  EEG showed intermittent  3 to 5 Hz theta-delta slowing in right temporo-parietal region.  Hyperventilation and photic stimulation were not performed.   ABNORMALITY - Intermittent slow, right temporo-parietal region IMPRESSION: This study is suggestive of cortical dysfunction in right temporoparietal region, likely secondary to underlying lesion. No seizures or epileptiform discharges were seen throughout the recording. Lora Havens   DG CHEST PORT 1 VIEW  Result Date: 10/08/2021 CLINICAL DATA:  76 year old male with history of acute encephalopathy. EXAM: PORTABLE CHEST 1 VIEW COMPARISON:  No priors. FINDINGS: Lung volumes are low. No consolidative airspace disease. No  pleural effusions. No pneumothorax. No pulmonary nodule or mass noted. Pulmonary vasculature and the cardiomediastinal silhouette are within normal limits. Atherosclerosis in the thoracic aorta. IMPRESSION: 1.  No radiographic evidence of acute cardiopulmonary disease. 2. Aortic atherosclerosis. Electronically Signed   By: Vinnie Langton M.D.   On: 10/08/2021 07:29   CT ANGIO HEAD NECK W WO CM (CODE STROKE)  Result Date: 10/07/2021 CLINICAL DATA:  Initial evaluation for neuro deficit, stroke suspected. EXAM: CT ANGIOGRAPHY HEAD AND NECK TECHNIQUE: Multidetector CT imaging of the head and neck was performed using the standard protocol during bolus administration of intravenous contrast. Multiplanar CT image reconstructions and MIPs were obtained to evaluate the vascular anatomy. Carotid stenosis measurements (when applicable) are obtained utilizing NASCET criteria, using the distal internal carotid diameter as the denominator. RADIATION DOSE REDUCTION: This exam was performed according to the departmental dose-optimization program which includes automated exposure control, adjustment of the mA and/or kV according to patient size and/or use of iterative reconstruction technique. CONTRAST:  40m OMNIPAQUE IOHEXOL 350 MG/ML SOLN COMPARISON:  Prior CT from earlier the same day as well as earlier exams. FINDINGS: CTA NECK FINDINGS Aortic arch: Visualized aortic arch normal caliber with standard 3 vessel morphology. No stenosis about the origin the great vessels. Right carotid system: Right common carotid artery widely patent. Eccentric mixed plaque about the proximal cervical right ICA with associated stenosis of up to 50% by NASCET criteria. Right ICA patent distally without stenosis or dissection. Left carotid system: Left CCA patent from its origin to the bifurcation without stenosis. Eccentric plaque about the proximal cervical left ICA with associated stenosis of up to 40% by NASCET criteria. Left ICA patent  distally without stenosis or dissection. Vertebral arteries: Both vertebral arteries arise from the subclavian arteries. No proximal subclavian artery stenosis. Atheromatous change at the origins of both vertebral arteries with associated moderate to severe ostial stenosis on the left. Vertebral arteries otherwise patent distally without stenosis or dissection. Right vertebral artery slightly dominant. Skeleton: No discrete or worrisome osseous lesions. Reversal of the normal cervical lordosis with moderate spondylosis at C6-7. Other neck: No other acute soft tissue abnormality within the neck. Upper chest: Visualized upper chest demonstrates no other acute finding. Review of the MIP images confirms the above findings CTA HEAD FINDINGS Anterior circulation: Petrous segments widely patent. Mild for age atheromatous change within the carotid siphons without stenosis. A1 segments patent. Normal anterior compared to complex. Anterior cerebral arteries patent without stenosis. No M1 stenosis or occlusion. No proximal MCA branch occlusion. Distal MCA branches perfused and symmetric. Posterior circulation: Both vertebral arteries patent without stenosis. Both PICA patent. Basilar patent to its distal aspect without stenosis. Superior cerebral arteries patent bilaterally. Right PCA supplied via the basilar. Fetal type origin of the left PCA. Both PCAs patent to their distal aspects without stenosis. Venous sinuses: Dedicated delayed venous phase images were performed. Normal enhancement seen throughout the superior sagittal sinus  anteriorly. Mildly heterogeneous enhancement seen subjacent to the patient's craniotomy defect within the mid-posterior superior sagittal sinus, with no convincing evidence for dural sinus thrombosis. Superior sagittal sinus patent distally to the torcula. Transverse and sigmoid sinuses are patent as are the jugular bulbs and visualized proximal internal jugular veins. Right transverse sinus  dominant. Straight sinus, vein of Galen, internal cerebral veins, and basal veins of Rosenthal are patent. No convincing cortical vein thrombosis, although note is made of some asymmetric filling of cortical veins overlying the right cerebral hemisphere, possibly due to the adjacent subdural collection and edema. Anatomic variants: None significant.  No aneurysm. Review of the MIP images confirms the above findings IMPRESSION: 1. Negative CTA for large vessel occlusion or other emergent finding. 2. Atheromatous change about the carotid bifurcations with associated stenosis of up to 50% on the right and 40% on the left. 3. 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 4. Grossly negative CTV. No convincing evidence for dural sinus thrombosis, although note is made of some heterogeneous filling of cortical veins overlying the right parietal convexity, possibly due to the adjacent subdural collection and edema. These results were communicated to Dr. Leonel Ramsay at 10:11 pm on 10/07/2021 by text page via the [facility] messaging system. Electronically Signed   By: Jeannine Boga M.D.   On: 10/07/2021 22:29   CT HEAD CODE STROKE WO CONTRAST  Result Date: 10/07/2021 CLINICAL DATA:  Code stroke. Initial evaluation for neuro deficit, stroke suspected. EXAM: CT HEAD WITHOUT CONTRAST TECHNIQUE: Contiguous axial images were obtained from the base of the skull through the vertex without intravenous contrast. RADIATION DOSE REDUCTION: This exam was performed according to the departmental dose-optimization program which includes automated exposure control, adjustment of the mA and/or kV according to patient size and/or use of iterative reconstruction technique. COMPARISON:  Prior MRI and CT from 09/26/2021. FINDINGS: Brain: Cerebral volume within normal limits. 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 There  is increased hypodensity at the subcortical right parietal region, new from prior exam, with appearance more typical of vasogenic edema. Finding raises the possibility for an underlying lesion or possibly PRES. No visible acute large vessel territory infarct. No other visible mass lesion. 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 Associated trace 2 mm right-to-left shift. Basilar cisterns remain patent. No hydrocephalus or trapping. No other acute intracranial hemorrhage. Vascular: No abnormal hyperdense vessel. Skull: Prior craniotomy defect noted at the right calvarium. Calvarium otherwise intact. No acute scalp soft tissue abnormality. Sinuses/Orbits: Globes and orbital soft tissues demonstrate no acute finding. Paranasal sinuses are clear. Mastoid air cells are clear. Other: None. ASPECTS [facility] Stroke Program Early CT Score) - Ganglionic level infarction (caudate, lentiform nuclei, internal capsule, insula, M1-M3 cortex): 7 - Supraganglionic infarction (M4-M6 cortex): 3 Total score (0-10 with 10 being normal): 10 IMPRESSION: 1. No definite acute ischemic changes. 2. ASPECTS is 10. 3. 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 4. Increased hypodensity involving the subcortical right parietal lobe, new/progressed as compared to 09/26/2021. Finding is indeterminate, but raises the possibility for an underlying lesion. Possible PRES could also be considered. Further evaluation with dedicated brain MRI suggested for further evaluation. These results were communicated to Dr. Leonel Ramsay at 9:47 pm on 10/07/2021 by text page via the [facility] messaging system. Results were also  discussed by telephone at the time of interpretation on 10/07/2021 at 9:53 pm to provider Dr. Leonel Ramsay. Electronically Signed   By: Jeannine Boga M.D.   On: 10/07/2021 21:54        Scheduled Meds:   dexamethasone (DECADRON) injection  4 mg Intravenous Q6H   Continuous Infusions:  levETIRAcetam 500 mg (10/09/21 0535)     LOS: 1 day   Time spent= 35 mins    Jeromy Borcherding Arsenio Loader, MD [facility]  If 7PM-7AM, please contact night-coverage  10/09/2021, 9:16 AM

## 2021-10-09 NOTE — Progress Notes (Signed)
Inpatient Rehab Admissions:  Inpatient Rehab Consult received.  I met with patient at the bedside for rehabilitation assessment and to discuss goals and expectations of an inpatient rehab admission.  Pt acknowledged understanding of CIR goals and expectations. Pt would like to discuss CIR with his wife before making a decision. Will continue to follow.  Signed: Gayland Curry, [place], [place] Admissions Coordinator [phone]

## 2021-10-09 NOTE — Progress Notes (Signed)
PT Cancellation Note  Patient Details Name: Hunter Chang MRN: [id number] DOB: [birth year]   Cancelled Treatment:    Reason Eval/Treat Not Completed: Active bedrest order will follow up as activity orders updated.   Lou Miner, DPT  Acute Rehabilitation Services  Office: [phone]    Rudean Hitt 10/09/2021, 7:44 AM

## 2021-10-10 DIAGNOSIS — R569 Unspecified convulsions: Secondary | ICD-10-CM | POA: Diagnosis not present

## 2021-10-10 LAB — BASIC METABOLIC PANEL
Anion gap: 7 (ref 5–15)
BUN: 18 mg/dL (ref 8–23)
CO2: 24 mmol/L (ref 22–32)
Calcium: 9.3 mg/dL (ref 8.9–10.3)
Chloride: 105 mmol/L (ref 98–111)
Creatinine, Ser: 0.8 mg/dL (ref 0.61–1.24)
GFR, Estimated: >60 mL/min (ref >60)
Glucose, Bld: 150 mg/dL — ABNORMAL HIGH (ref 70–99)
Potassium: 3.9 mmol/L (ref 3.5–5.1)
Sodium: 136 mmol/L (ref 135–145)

## 2021-10-10 LAB — MAGNESIUM: Magnesium: 2.3 mg/dL (ref 1.7–2.4)

## 2021-10-10 NOTE — Progress Notes (Signed)
PROGRESS NOTE    Hunter Chang  WNI:[id number] DOB: 02-13-1946 DOA: 10/07/2021 PCP: Binnie Rail, MD   Brief Narrative:  76 year old with history of trigeminal neuralgia to the hospital for acute focal seizure of left lower extremity at home.  CTA head and MRI brain were negative during recent admission.  Admitted for presumed seizures, neurology team was consulted.  EEG was started on the patient.  Neurology spoke with neurooncology who will discuss his case report, started high-dose steroids.  Plan on lumbar puncture on 7/17.   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  Previous provider discussed with neurosurgery, no further intervention at this time. - EEG-suggestive of cortical dysfunction in the right temporoparietal lobe area with no obvious seizures - Neurology-discussed case with Dr. Mickeal Skinner.  Case will be discussed at tumor board on Monday, high-dose dexamethasone.  Will need lumbar puncture on Monday. - Keppra 500 mg twice daily  Acute metabolic encephalopathy - Resolved  Hypokalemia - Replete as necessary   PT/OT = CIR. CIR team consulted.    DVT prophylaxis: SCDs Start: 10/07/21 2338 Code Status: Full Code Family Communication: Wife and brother at bedside  Status is: Inpatient Remains inpatient appropriate because: Plans for LP on Monday and case being presented at Tumor board by NeuroOnc. Maintain hosp stay    Subjective: Feels great no complaints this morning.  Family members are at bedside.  According to wife patient is more talkative today and appears to be much better than even his normal  self.    Examination:  Constitutional: Not in acute distress Respiratory: Clear to auscultation bilaterally Cardiovascular: Normal sinus rhythm, no rubs Abdomen: Nontender nondistended good bowel sounds Musculoskeletal: No edema noted Skin: No rashes seen Neurologic: CN 2-12 grossly intact.  And nonfocal Psychiatric: Normal judgment and insight. Alert and oriented x 3. Normal mood.   Objective: Vitals:   10/09/21 2325 10/10/21 0411 10/10/21 0500 10/10/21 0816  BP: (!) 142/82 108/65  129/78  Pulse: 85 74  76  Resp: '18 19  20  '$ Temp: 97.7 F (36.5 C) 98.4 F (36.9 C)  97.8 F (36.6 C)  TempSrc: Oral Oral  Oral  SpO2: 97% 97%  99%  Weight:   76.7 kg   Height:        Intake/Output Summary (Last 24 hours) at 10/10/2021 0857 Last data filed at 10/09/2021 1800 Gross per 24 hour  Intake 100 ml  Output --  Net 100 ml   Filed Weights   10/08/21 0111 10/09/21 0510 10/10/21 0500  Weight: 79.4 kg 76.5 kg 76.7 kg     Data Reviewed:   CBC: Recent Labs  Lab 10/07/21 2128 10/07/21 2241 10/08/21 0541  WBC 8.3  --  7.4  NEUTROABS 5.8  --  4.8  HGB 13.9 13.9 12.7*  HCT 40.7 41.0 36.6*  MCV 87.3  --  85.9  PLT 350  --  381   Basic Metabolic Panel: Recent Labs  Lab 10/04/21 1227 10/07/21 2128 10/07/21 2241 10/08/21 0541 10/10/21 0208  NA 137 137 137 139 136  K 3.9 3.6 3.6 3.4* 3.9  CL 99 101 102 103 105  CO2 30  24  --  26 24  GLUCOSE 94 158* 154* 105* 150*  BUN '12 17 18 13 18  '$ CREATININE 0.97 1.07 1.10 0.86 0.80  CALCIUM 9.9 9.7  --  8.9 9.3  MG  --  2.0  --  2.1 2.3   GFR: Estimated Creatinine Clearance: 77.2 mL/min (by C-G formula based on SCr of 0.8 mg/dL). Liver Function Tests: Recent Labs  Lab 10/04/21 1227 10/07/21 2128 10/08/21 0541  AST '26 24 20  '$ ALT 41 36 30  ALKPHOS 170* 163* 134*  BILITOT 0.4 0.3 0.4  PROT 7.7 7.3 6.5  ALBUMIN 4.0 3.6 3.0*   No results for input(s): "LIPASE", "AMYLASE" in the last 168 hours. No results for input(s):  "AMMONIA" in the last 168 hours. Coagulation Profile: Recent Labs  Lab 10/07/21 2128  INR 1.0   Cardiac Enzymes: No results for input(s): "CKTOTAL", "CKMB", "CKMBINDEX", "TROPONINI" in the last 168 hours. BNP (last 3 results) No results for input(s): "PROBNP" in the last 8760 hours. HbA1C: No results for input(s): "HGBA1C" in the last 72 hours. CBG: Recent Labs  Lab 10/07/21 2126  GLUCAP 157*   Lipid Profile: No results for input(s): "CHOL", "HDL", "LDLCALC", "TRIG", "CHOLHDL", "LDLDIRECT" in the last 72 hours. Thyroid Function Tests: No results for input(s): "TSH", "T4TOTAL", "FREET4", "T3FREE", "THYROIDAB" in the last 72 hours. Anemia Panel: No results for input(s): "VITAMINB12", "FOLATE", "FERRITIN", "TIBC", "IRON", "RETICCTPCT" in the last 72 hours. Sepsis Labs: No results for input(s): "PROCALCITON", "LATICACIDVEN" in the last 168 hours.  Recent Results (from the past 240 hour(s))  Resp Panel by RT-PCR (Flu A&B, Covid) Anterior Nasal Swab     Status: None   Collection Time: 10/07/21  9:26 PM   Specimen: Anterior Nasal Swab  Result Value Ref Range Status   SARS Coronavirus 2 by RT PCR NEGATIVE NEGATIVE Final    Comment: (NOTE) SARS-CoV-2 target nucleic acids are NOT DETECTED.  The SARS-CoV-2 RNA is generally detectable in upper respiratory specimens during the acute phase of infection. The lowest concentration of SARS-CoV-2 viral copies this assay can detect is 138 copies/mL. A negative result does not preclude SARS-Cov-2 infection and should not be used as the sole basis for treatment or other patient management decisions. A negative result may occur with  improper specimen collection/handling, submission of specimen other than nasopharyngeal swab, presence of viral mutation(s) within the areas targeted by this assay, and inadequate number of viral copies(<138 copies/mL). A negative result must be combined with clinical observations, patient history, and  epidemiological information. The expected result is Negative.  Fact Sheet for Patients:  EntrepreneurPulse.com.au  Fact Sheet for Healthcare Providers:  IncredibleEmployment.be  This test is no t yet approved or cleared by the [place] FDA and  has been authorized for detection and/or diagnosis of SARS-CoV-2 by FDA under an Emergency Use Authorization (EUA). This EUA will remain  in effect (meaning this test can be used) for the duration of the COVID-19 declaration under Section 564(b)(1) of the Act, 21 U.S.C.section 360bbb-3(b)(1), unless the authorization is terminated  or revoked sooner.       Influenza A by PCR NEGATIVE NEGATIVE Final   Influenza B by PCR NEGATIVE NEGATIVE Final    Comment: (NOTE) The Xpert Xpress SARS-CoV-2/FLU/RSV plus assay is intended as an aid in the diagnosis of influenza from Nasopharyngeal swab specimens and should not be used as a sole basis for treatment. Nasal washings and aspirates are unacceptable for Xpert Xpress SARS-CoV-2/FLU/RSV testing.  Fact Sheet for Patients: EntrepreneurPulse.com.au  Fact Sheet for Healthcare Providers: IncredibleEmployment.be  This test is not yet approved or cleared by the [place] FDA and has been authorized for detection and/or diagnosis of SARS-CoV-2 by FDA under an Emergency Use Authorization (EUA). This EUA will remain in effect (meaning this test can be used) for the duration of the COVID-19 declaration under Section 564(b)(1) of the Act, 21 U.S.C. section 360bbb-3(b)(1), unless the authorization is terminated or revoked.  Performed at [facility], [place] [address] [place], [place] [address]          Radiology Studies: MR BRAIN W WO CONTRAST  Result Date: 10/08/2021 CLINICAL DATA:  Seizure, new-onset. Per chart review, reportedly had a fall and has H/O SCALP TUMOR REMOVAL WITH RADIATION EXAM: MRI HEAD WITHOUT  AND WITH CONTRAST TECHNIQUE: Multiplanar, multiecho pulse sequences of the brain and surrounding structures were obtained without and with intravenous contrast. CONTRAST:  12m GADAVIST GADOBUTROL 1 MMOL/ML IV SOLN COMPARISON:  MRI 09/26/2021. FINDINGS: Motion limited. Brain: Similar versus mildly increased size of a right cerebral convexity extra-axial fluid collection which measures up to 12 mm superiorly, probably largely due to redistribution. Mass effect is similar with approximately 2-3 mm of leftward midline shift. Interval rapid development of expansile edema in the subcortical right parietal lobe with irregular overlying dural enhancement subjacent to prior scalp/postsurgical change. No evidence of acute hemorrhage, acute infarct, or hydrocephalus. Additional scattered T2/FLAIR hyperintensities in the white matter nonspecific but compatible with chronic microvascular ischemic disease. Vascular: Major arterial flow voids are maintained at the skull base. Skull and upper cervical spine: Normal marrow signal. Sinuses/Orbits: Clear sinuses.  Unremarkable orbits. Other: No mastoid effusions. IMPRESSION: 1. Interval rapid development of expansile edema in the subcortical right parietal lobe with irregular overlying dural enhancement subjacent to prior scalp/calvarial postsurgical change. 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 Encephalitis is a differential consideration. The adjacent superior sagittal sinus is small, but appears patent making dural venous sinus thrombosis unlikely. A follow-up MRI after resolution of the patient's subdural hematoma may be helpful to ensure that findings are not reactive given adjacent hematoma. 2. Similar versus mildly increased size of a right cerebral convexity subdural  hematoma which measures up to 12 mm superiorly, probably largely due to redistribution. Mass effect is similar with approximately 2-3 mm of leftward midline shift. Findings discussed with Dr. PAlvino Chapelvia telephone at 11:30 AM. Electronically Signed   By: FMargaretha SheffieldM.D.   On: 10/08/2021 11:36        Scheduled Meds:  dexamethasone (DECADRON) injection  4 mg Intravenous Q6H   Continuous Infusions:  levETIRAcetam 500 mg (10/10/21 0630)     LOS: 2 days   Time spent= 35 mins    Jowell Bossi CArsenio Loader MD [facility]  If 7PM-7AM, please contact night-coverage  10/10/2021, 8:57 AM

## 2021-10-10 NOTE — Progress Notes (Signed)
Briefly seen this morning.  Remains subtly weak on the left lower extremity in comparison to the right.  Continue steroids.  Spinal tap as recommended before to be done tomorrow.  Tumor board discussion tomorrow.  My team will also reach out to Dr. Mickeal Skinner for updates on the tumor board discussion and will await further recommendations.  -- Amie Portland, MD Neurologist [facility] Pager: [phone]    No charge note

## 2021-10-11 ENCOUNTER — Inpatient Hospital Stay: Payer: PPO | Admitting: Internal Medicine

## 2021-10-11 ENCOUNTER — Inpatient Hospital Stay: Payer: PPO

## 2021-10-11 DIAGNOSIS — R569 Unspecified convulsions: Secondary | ICD-10-CM | POA: Diagnosis not present

## 2021-10-11 LAB — BASIC METABOLIC PANEL
Anion gap: 9 (ref 5–15)
BUN: 20 mg/dL (ref 8–23)
CO2: 25 mmol/L (ref 22–32)
Calcium: 9.3 mg/dL (ref 8.9–10.3)
Chloride: 103 mmol/L (ref 98–111)
Creatinine, Ser: 0.82 mg/dL (ref 0.61–1.24)
GFR, Estimated: >60 mL/min (ref >60)
Glucose, Bld: 149 mg/dL — ABNORMAL HIGH (ref 70–99)
Potassium: 3.8 mmol/L (ref 3.5–5.1)
Sodium: 137 mmol/L (ref 135–145)

## 2021-10-11 LAB — MAGNESIUM: Magnesium: 2.3 mg/dL (ref 1.7–2.4)

## 2021-10-11 MED ORDER — LEVETIRACETAM 500 MG PO TABS
500.0000 mg | ORAL_TABLET | Freq: Two times a day (BID) | ORAL | Status: DC
  Administered 2021-10-11 – 2021-10-12 (×3): 500 mg via ORAL
  Filled 2021-10-11 (×3): qty 1

## 2021-10-11 MED ORDER — LEVETIRACETAM 500 MG PO TABS: 500.0000 mg | ORAL_TABLET | Freq: Two times a day (BID) | ORAL | Status: AC

## 2021-10-11 NOTE — Care Management Important Message (Signed)
Important Message  Patient Details  Name: Hunter Chang MRN: [id number] Date of Birth: 1946/02/20   Medicare Important Message Given:  Yes     Orbie Pyo 10/11/2021, 3:13 PM

## 2021-10-11 NOTE — Discharge Summary (Addendum)
Physician Discharge Summary  Hunter Chang ZDG:[id number] DOB: 10/25/1945 DOA: 10/07/2021  PCP: Binnie Rail, MD  Admit date: 10/07/2021 Discharge date: 10/12/2021  Admitted From: Home Disposition:  [facility], Patient accepted by Dr Aliene Altes.   Recommendations for Outpatient Follow-up:  Transfer to [facility].    Discharge Condition: Stable CODE STATUS: Full  Diet recommendation: Regular  Brief/Interim Summary:  76 year old with history of trigeminal neuralgia to the hospital for acute focal seizure of left lower extremity at home.  CTA head and MRI brain were negative during recent admission.  Admitted for presumed seizures, neurology team was consulted.  EEG was started on the patient.  Neurology spoke with neurooncology who will discuss his case report, started high-dose steroids.  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  At this time plan is to continue Keppra, discontinue steroids. After discussing case with Dr Ellene Route from Neurosurgery, who recommended transferring patient to his primary surgeons at [facility], which patient and family agreed to.  I called [facility] and Dr Aliene Altes accepted the patient for further care and management. Per [facility] transfer line, anticipated wait time 1-2 days but can be sooner  Patient and family extensively updated by me.   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   - EEG-suggestive of cortical dysfunction in the right temporoparietal lobe area with no obvious seizures - Dr. Mickeal Skinner presented case at tumor board today and I was notified by neurology, Dr. Curly Sh[place]: This is likely a surgical site infection from tumor resection/radiation and reactive changes which will likely require surgical intervention with Intra-Op cultures and antibiotic treatment.  In the meantime continue Keppra, seizure precautions and outpatient follow-up with neurology. We will consult infectious disease while patient is here.  ID recommends holding off on Abx until we obtain intraOp cultures. Spoke with Dr. Ellene Route from neurosurgery who recommends transferring patient to [facility]. Transfer details as mentioned above.  Acute metabolic encephalopathy - Resolved   Hypokalemia - Replete as necessary   PT/OT = inpatient rehab        Discharge Diagnoses:  Principal Problem:   Seizures ([place]) Active Problems:   Transient weakness of left lower extremity   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HCC)   Acute encephalopathy   Left-sided weakness      Consultations: Neurology Neurosurgery  Subjective: No complaints doing well.  Remains asymptomatic.  Discharge Exam: Vitals:   10/12/21 0743 10/12/21 1059  BP: 115/70 (!) 141/85  Pulse: 65 84  Resp: 18 20  Temp: 97.7 F (36.5 C) 98.4 F (36.9 C)  SpO2: 95% 98%   Vitals:   10/11/21 2330 10/12/21 0518 10/12/21 0743 10/12/21 1059  BP: (!) 157/89 (!) 142/85 115/70 (!) 141/85  Pulse:   65 84  Resp: '18 18 18 20  '$ Temp: 98.1 F (36.7 C) (!) 97.5 F (  36.4 C) 97.7 F (36.5 C) 98.4 F (36.9 C)  TempSrc: Oral Oral Oral Oral  SpO2: 97% 97% 95% 98%  Weight:      Height:        General: Pt is alert, awake, not in acute distress Cardiovascular: RRR,  S1/S2 +, no rubs, no gallops Respiratory: CTA bilaterally, no wheezing, no rhonchi Abdominal: Soft, NT, ND, bowel sounds + Extremities: no edema, no cyanosis  Discharge Instructions   Allergies as of 10/12/2021       Reactions   Succinylcholine Chloride Other (See Comments)   "Paralyzed my diaphragm"        Medication List     STOP taking these medications    aspirin EC 81 MG tablet   carbamazepine 200 MG 12 hr tablet Commonly known as: TEGretol-XR   sildenafil 100 MG tablet Commonly known as: VIAGRA       TAKE these medications    latanoprost 0.005 % ophthalmic solution Commonly known as: XALATAN Place 1 drop into both eyes at bedtime.   levETIRAcetam 500 MG tablet Commonly known as: KEPPRA Take 1 tablet (500 mg total) by mouth 2 (two) times daily.   magnesium gluconate 500 MG tablet Commonly known as: MAGONATE Take 500 mg by mouth daily.   One-A-Day Mens 50+ Advantage Tabs Take 1 tablet by mouth daily.   VITAMIN D PO Take 1 tablet by mouth daily.   ZINC PO Take 50 mg by mouth daily.        Allergies  Allergen Reactions   Succinylcholine Chloride Other (See Comments)    "Paralyzed my diaphragm"    You were cared for by a hospitalist during your hospital stay. If you have any questions about your discharge medications or the care you received while you were in the hospital after you are discharged, you can call the unit and asked to speak with the hospitalist on call if the hospitalist that took care of you is not available. Once you are discharged, your primary care physician will handle any further medical issues. Please note that no refills for any discharge medications will be authorized once you are discharged, as it is imperative that you return to your primary care physician (or establish a relationship with a primary care physician if you do not have one) for your aftercare needs so that they can reassess your need for medications and monitor  your lab values.   Procedures/Studies: MR BRAIN W WO CONTRAST  Result Date: 10/08/2021 CLINICAL DATA:  Seizure, new-onset. Per chart review, reportedly had a fall and has H/O SCALP TUMOR REMOVAL WITH RADIATION EXAM: MRI HEAD WITHOUT AND WITH CONTRAST TECHNIQUE: Multiplanar, multiecho pulse sequences of the brain and surrounding structures were obtained without and with intravenous contrast. CONTRAST:  60m GADAVIST GADOBUTROL 1 MMOL/ML IV SOLN COMPARISON:  MRI 09/26/2021. FINDINGS: Motion limited. Brain: Similar versus mildly increased size of a right cerebral convexity extra-axial fluid collection which measures up to 12 mm superiorly, probably largely due to redistribution. Mass effect is similar with approximately 2-3 mm of leftward midline shift. Interval rapid development of expansile edema in the subcortical right parietal lobe with irregular overlying dural enhancement subjacent to prior scalp/postsurgical change. No evidence of acute hemorrhage, acute infarct, or hydrocephalus. Additional scattered T2/FLAIR hyperintensities in the white matter nonspecific but compatible with chronic microvascular ischemic disease. Vascular: Major arterial flow voids are maintained at the skull base. Skull and upper cervical spine: Normal marrow signal. Sinuses/Orbits: Clear sinuses.  Unremarkable orbits. Other: No mastoid  effusions. IMPRESSION: 1. Interval rapid development of expansile edema in the subcortical right parietal lobe with irregular overlying dural enhancement subjacent to prior scalp/calvarial postsurgical change. 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 Encephalitis is a differential consideration. The adjacent superior sagittal sinus is small, but appears patent making dural venous sinus thrombosis  unlikely. A follow-up MRI after resolution of the patient's subdural hematoma may be helpful to ensure that findings are not reactive given adjacent hematoma. 2. Similar versus mildly increased size of a right cerebral convexity subdural hematoma which measures up to 12 mm superiorly, probably largely due to redistribution. Mass effect is similar with approximately 2-3 mm of leftward midline shift. Findings discussed with Dr. Alvino Chapel via telephone at 11:30 AM. Electronically Signed   By: Margaretha Sheffield M.D.   On: 10/08/2021 11:36   EEG adult  Result Date: 10/08/2021 Lora Havens, MD     10/08/2021  8:44 AM Patient Name: Hunter Chang MRN: [id number] Epilepsy Attending: Lora Havens Referring Physician/Provider: Greta Doom, MD Date: 10/08/2021 Duration: 26.43 mins Patient history:  76 year old male with new onset seizures in the setting of right-sided hygroma with tiny amount of acute blood, as well as what appears to be new vasogenic edema of the right parietal region. EEG to evaluate for seizure. Level of alertness: Awake, asleep AEDs during EEG study: LEV Technical aspects: This EEG study was done with scalp electrodes positioned according to the 10-20 International system of electrode placement. Electrical activity was acquired at a sampling rate of '500Hz'$  and reviewed with a high frequency filter of '70Hz'$  and a low frequency filter of '1Hz'$ . EEG data were recorded continuously and digitally stored. Description: The posterior dominant rhythm consists of 9-10 Hz activity of moderate voltage (25-35 uV) seen predominantly in posterior head regions, symmetric and reactive to eye opening and eye closing. Sleep was characterized by vertex waves, sleep spindles (12 to 14 Hz), maximal frontocentral region.  EEG showed intermittent 3 to 5 Hz theta-delta slowing in right temporo-parietal region.  Hyperventilation and photic stimulation were not performed.   ABNORMALITY - Intermittent slow, right  temporo-parietal region IMPRESSION: This study is suggestive of cortical dysfunction in right temporoparietal region, likely secondary to underlying lesion. No seizures or epileptiform discharges were seen throughout the recording. Lora Havens   DG CHEST PORT 1 VIEW  Result Date: 10/08/2021 CLINICAL DATA:  76 year old male with history of acute encephalopathy. EXAM: PORTABLE CHEST 1 VIEW COMPARISON:  No priors. FINDINGS: Lung volumes are low. No consolidative airspace disease. No pleural effusions. No pneumothorax. No pulmonary nodule or mass noted. Pulmonary vasculature and the cardiomediastinal silhouette are within normal limits. Atherosclerosis in the thoracic aorta. IMPRESSION: 1.  No radiographic evidence of acute cardiopulmonary disease. 2. Aortic atherosclerosis. Electronically Signed   By: Vinnie Langton M.D.   On: 10/08/2021 07:29   CT ANGIO HEAD NECK W WO CM (CODE STROKE)  Result Date: 10/07/2021 CLINICAL DATA:  Initial evaluation for neuro deficit, stroke suspected. EXAM: CT ANGIOGRAPHY HEAD AND NECK TECHNIQUE: Multidetector CT imaging of the head and neck was performed using the standard protocol during bolus administration of intravenous contrast. Multiplanar CT image reconstructions and MIPs were obtained to evaluate the vascular anatomy. Carotid stenosis measurements (when applicable) are obtained utilizing NASCET criteria, using the distal internal carotid diameter as the denominator. RADIATION DOSE REDUCTION: This  exam was performed according to the departmental dose-optimization program which includes automated exposure control, adjustment of the mA and/or kV according to patient size and/or use of iterative reconstruction technique. CONTRAST:  57m OMNIPAQUE IOHEXOL 350 MG/ML SOLN COMPARISON:  Prior CT from earlier the same day as well as earlier exams. FINDINGS: CTA NECK FINDINGS Aortic arch: Visualized aortic arch normal caliber with standard 3 vessel morphology. No stenosis  about the origin the great vessels. Right carotid system: Right common carotid artery widely patent. Eccentric mixed plaque about the proximal cervical right ICA with associated stenosis of up to 50% by NASCET criteria. Right ICA patent distally without stenosis or dissection. Left carotid system: Left CCA patent from its origin to the bifurcation without stenosis. Eccentric plaque about the proximal cervical left ICA with associated stenosis of up to 40% by NASCET criteria. Left ICA patent distally without stenosis or dissection. Vertebral arteries: Both vertebral arteries arise from the subclavian arteries. No proximal subclavian artery stenosis. Atheromatous change at the origins of both vertebral arteries with associated moderate to severe ostial stenosis on the left. Vertebral arteries otherwise patent distally without stenosis or dissection. Right vertebral artery slightly dominant. Skeleton: No discrete or worrisome osseous lesions. Reversal of the normal cervical lordosis with moderate spondylosis at C6-7. Other neck: No other acute soft tissue abnormality within the neck. Upper chest: Visualized upper chest demonstrates no other acute finding. Review of the MIP images confirms the above findings CTA HEAD FINDINGS Anterior circulation: Petrous segments widely patent. Mild for age atheromatous change within the carotid siphons without stenosis. A1 segments patent. Normal anterior compared to complex. Anterior cerebral arteries patent without stenosis. No M1 stenosis or occlusion. No proximal MCA branch occlusion. Distal MCA branches perfused and symmetric. Posterior circulation: Both vertebral arteries patent without stenosis. Both PICA patent. Basilar patent to its distal aspect without stenosis. Superior cerebral arteries patent bilaterally. Right PCA supplied via the basilar. Fetal type origin of the left PCA. Both PCAs patent to their distal aspects without stenosis. Venous sinuses: Dedicated delayed  venous phase images were performed. Normal enhancement seen throughout the superior sagittal sinus anteriorly. Mildly heterogeneous enhancement seen subjacent to the patient's craniotomy defect within the mid-posterior superior sagittal sinus, with no convincing evidence for dural sinus thrombosis. Superior sagittal sinus patent distally to the torcula. Transverse and sigmoid sinuses are patent as are the jugular bulbs and visualized proximal internal jugular veins. Right transverse sinus dominant. Straight sinus, vein of Galen, internal cerebral veins, and basal veins of Rosenthal are patent. No convincing cortical vein thrombosis, although note is made of some asymmetric filling of cortical veins overlying the right cerebral hemisphere, possibly due to the adjacent subdural collection and edema. Anatomic variants: None significant.  No aneurysm. Review of the MIP images confirms the above findings IMPRESSION: 1. Negative CTA for large vessel occlusion or other emergent finding. 2. Atheromatous change about the carotid bifurcations with associated stenosis of up to 50% on the right and 40% on the left. 3. 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 4. Grossly negative CTV. No convincing evidence for dural sinus thrombosis, although note is made of some heterogeneous filling of cortical veins overlying the right parietal convexity, possibly due to the adjacent subdural collection and edema. These results were communicated to Dr. KLeonel Ramsayat 10:11 pm on 10/07/2021 by text page via the [facility] system. Electronically Signed   By: BJeannine BogaM.D.   On: 10/07/2021 22:29   CT HEAD  CODE STROKE WO CONTRAST  Result Date: 10/07/2021 CLINICAL DATA:  Code stroke. Initial evaluation for neuro deficit, stroke suspected. EXAM: CT HEAD WITHOUT CONTRAST TECHNIQUE: Contiguous axial images were obtained from the base of the skull through the vertex without intravenous  contrast. RADIATION DOSE REDUCTION: This exam was performed according to the departmental dose-optimization program which includes automated exposure control, adjustment of the mA and/or kV according to patient size and/or use of iterative reconstruction technique. COMPARISON:  Prior MRI and CT from 09/26/2021. FINDINGS: Brain: Cerebral volume within normal limits. 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 There is increased hypodensity at the subcortical right parietal region, new from prior exam, with appearance more typical of vasogenic edema. Finding raises the possibility for an underlying lesion or possibly PRES. No visible acute large vessel territory infarct. No other visible mass lesion. 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 Associated trace 2 mm right-to-left shift. Basilar cisterns remain patent. No hydrocephalus or trapping. No other acute intracranial hemorrhage. Vascular: No abnormal hyperdense vessel. Skull: Prior craniotomy defect noted at the right calvarium. Calvarium otherwise intact. No acute scalp soft tissue abnormality. Sinuses/Orbits: Globes and orbital soft tissues demonstrate no acute finding. Paranasal sinuses are clear. Mastoid air cells are clear. Other: None. ASPECTS [facility] Stroke Program Early CT Score) - Ganglionic level infarction (caudate, lentiform nuclei, internal capsule, insula, M1-M3 cortex): 7 - Supraganglionic infarction (M4-M6 cortex): 3 Total score (0-10 with 10 being normal): 10 IMPRESSION: 1. No definite acute ischemic changes. 2. ASPECTS is 10. 3. 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 4. Increased hypodensity involving the subcortical right parietal lobe, new/progressed as compared to 09/26/2021. Finding is indeterminate, but raises the  possibility for an underlying lesion. Possible PRES could also be considered. Further evaluation with dedicated brain MRI suggested for further evaluation. These results were communicated to Dr. Leonel Ramsay at 9:47 pm on 10/07/2021 by text page via the [facility] messaging system. Results were also discussed by telephone at the time of interpretation on 10/07/2021 at 9:53 pm to provider Dr. Leonel Ramsay. Electronically Signed   By: Jeannine Boga M.D.   On: 10/07/2021 21:54   MR BRAIN WO CONTRAST  Result Date: 09/26/2021 CLINICAL DATA:  TIA. Patient fell today. Patient states legs gave out and went numb. Numbness sensation lasted approximately 20 minutes. EXAM: MRI HEAD WITHOUT CONTRAST TECHNIQUE: Multiplanar, multiecho pulse sequences of the brain and surrounding structures were obtained without intravenous contrast. COMPARISON:  CT head without contrast 09/26/2021 FINDINGS: Brain: A thin extra-axial collection is present over the right convexity measuring up to 5 mm. No significant mass effect is associated. Moderate atrophy and scattered white matter disease is present otherwise. No other acute intracranial abnormality is present. No acute infarct or hemorrhage is present. The ventricles are proportionate to the degree of atrophy. No other significant extra-axial fluid collection is present. The internal auditory canals are within normal limits. The brainstem and cerebellum are within normal limits. Vascular: Flow is present in the major intracranial arteries. Skull and upper cervical spine: The craniocervical junction is normal. Upper cervical spine is within normal limits. Marrow signal is unremarkable. Postoperative and radiation changes to the posterior frontal scalp and skull are noted. Sinuses/Orbits: The paranasal sinuses and mastoid air cells are clear. The globes and orbits are within normal limits. IMPRESSION: 1. Thin extra-axial fluid collection  over the right convexity measuring up to 5 mm without  significant mass effect. This likely represents a hygroma. Acuity is indeterminate. No mass effect is present. No blood products are associated. 2. No acute intracranial abnormality. 3. Moderate atrophy and scattered white matter disease is moderately advanced for age. This likely reflects the sequela of chronic microvascular ischemia. 4. Post treatment changes to the scalp and vertex over the skull, likely related to prior radiation. Electronically Signed   By: San Morelle M.D.   On: 09/26/2021 14:48   CT Head Wo Contrast  Result Date: 09/26/2021 CLINICAL DATA:  Acute onset left lower extremity weakness and numbness. Transient ischemic attack. EXAM: CT HEAD WITHOUT CONTRAST TECHNIQUE: Contiguous axial images were obtained from the base of the skull through the vertex without intravenous contrast. RADIATION DOSE REDUCTION: This exam was performed according to the departmental dose-optimization program which includes automated exposure control, adjustment of the mA and/or kV according to patient size and/or use of iterative reconstruction technique. COMPARISON:  None Available. FINDINGS: Brain: No evidence of intracranial hemorrhage, acute infarction, hydrocephalus, or mass lesion/mass effect. A small chronic right frontal subdural hygroma is seen measuring approximately 5 mm in thickness, without significant mass effect. Postsurgical changes seen involving the high right parietal scalp and calvarium. Moderate chronic small vessel disease is also seen. Vascular:  No hyperdense vessel or other acute findings. Skull: No evidence of fracture or other significant bone abnormality. Sinuses/Orbits:  No acute findings. Other: None. IMPRESSION: No acute intracranial abnormality. 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 Electronically Signed   By: Marlaine Hind M.D.   On: 09/26/2021 10:19     The results of significant diagnostics from this hospitalization (including  imaging, microbiology, ancillary and laboratory) are listed below for reference.     Microbiology: Recent Results (from the past 240 hour(s))  Resp Panel by RT-PCR (Flu A&B, Covid) Anterior Nasal Swab     Status: None   Collection Time: 10/07/21  9:26 PM   Specimen: Anterior Nasal Swab  Result Value Ref Range Status   SARS Coronavirus 2 by RT PCR NEGATIVE NEGATIVE Final    Comment: (NOTE) SARS-CoV-2 target nucleic acids are NOT DETECTED.  The SARS-CoV-2 RNA is generally detectable in upper respiratory specimens during the acute phase of infection. The lowest concentration of SARS-CoV-2 viral copies this assay can detect is 138 copies/mL. A negative result does not preclude SARS-Cov-2 infection and should not be used as the sole basis for treatment or other patient management decisions. A negative result may occur with  improper specimen collection/handling, submission of specimen other than nasopharyngeal swab, presence of viral mutation(s) within the areas targeted by this assay, and inadequate number of viral copies(<138 copies/mL). A negative result must be combined with clinical observations, patient history, and epidemiological information. The expected result is Negative.  Fact Sheet for Patients:  EntrepreneurPulse.com.au  Fact Sheet for Healthcare Providers:  IncredibleEmployment.be  This test is no t yet approved or cleared by the [place] FDA and  has been authorized for detection and/or diagnosis of SARS-CoV-2 by FDA under an Emergency Use Authorization (EUA). This EUA will remain  in effect (meaning this test can be used) for the duration of the COVID-19 declaration under Section 564(b)(1) of the Act, 21 U.S.C.section 360bbb-3(b)(1), unless the authorization is terminated  or revoked sooner.       Influenza A by PCR NEGATIVE NEGATIVE Final   Influenza B by PCR NEGATIVE NEGATIVE Final  Comment: (NOTE) The Xpert Xpress  SARS-CoV-2/FLU/RSV plus assay is intended as an aid in the diagnosis of influenza from Nasopharyngeal swab specimens and should not be used as a sole basis for treatment. Nasal washings and aspirates are unacceptable for Xpert Xpress SARS-CoV-2/FLU/RSV testing.  Fact Sheet for Patients: EntrepreneurPulse.com.au  Fact Sheet for Healthcare Providers: IncredibleEmployment.be  This test is not yet approved or cleared by the [place] FDA and has been authorized for detection and/or diagnosis of SARS-CoV-2 by FDA under an Emergency Use Authorization (EUA). This EUA will remain in effect (meaning this test can be used) for the duration of the COVID-19 declaration under Section 564(b)(1) of the Act, 21 U.S.C. section 360bbb-3(b)(1), unless the authorization is terminated or revoked.  Performed at [facility], [place] [address], [place], [place] [address]      Labs: BNP (last 3 results) No results for input(s): "BNP" in the last 8760 hours. Basic Metabolic Panel: Recent Labs  Lab 10/07/21 2128 10/07/21 2241 10/08/21 0541 10/10/21 0208 10/11/21 0432 10/12/21 0445  NA 137 137 139 136 137 139  K 3.6 3.6 3.4* 3.9 3.8 3.3*  CL 101 102 103 105 103 101  CO2 24  --  '26 24 25 26  '$ GLUCOSE 158* 154* 105* 150* 149* 106*  BUN '17 18 13 18 20 18  '$ CREATININE 1.07 1.10 0.86 0.80 0.82 0.88  CALCIUM 9.7  --  8.9 9.3 9.3 9.0  MG 2.0  --  2.1 2.3 2.3 2.1   Liver Function Tests: Recent Labs  Lab 10/07/21 2128 10/08/21 0541  AST 24 20  ALT 36 30  ALKPHOS 163* 134*  BILITOT 0.3 0.4  PROT 7.3 6.5  ALBUMIN 3.6 3.0*   No results for input(s): "LIPASE", "AMYLASE" in the last 168 hours. No results for input(s): "AMMONIA" in the last 168 hours. CBC: Recent Labs  Lab 10/07/21 2128 10/07/21 2241 10/08/21 0541  WBC 8.3  --  7.4  NEUTROABS 5.8  --  4.8  HGB 13.9 13.9 12.7*  HCT 40.7 41.0 36.6*  MCV 87.3  --  85.9  PLT 350  --  283   Cardiac  Enzymes: No results for input(s): "CKTOTAL", "CKMB", "CKMBINDEX", "TROPONINI" in the last 168 hours. BNP: Invalid input(s): "POCBNP" CBG: Recent Labs  Lab 10/07/21 2126  GLUCAP 157*   D-Dimer No results for input(s): "DDIMER" in the last 72 hours. Hgb A1c No results for input(s): "HGBA1C" in the last 72 hours. Lipid Profile No results for input(s): "CHOL", "HDL", "LDLCALC", "TRIG", "CHOLHDL", "LDLDIRECT" in the last 72 hours. Thyroid function studies No results for input(s): "TSH", "T4TOTAL", "T3FREE", "THYROIDAB" in the last 72 hours.  Invalid input(s): "FREET3" Anemia work up No results for input(s): "VITAMINB12", "FOLATE", "FERRITIN", "TIBC", "IRON", "RETICCTPCT" in the last 72 hours. Urinalysis    Component Value Date/Time   COLORURINE YELLOW 10/08/2021 0403   APPEARANCEUR CLEAR 10/08/2021 0403   LABSPEC >1.046 (H) 10/08/2021 0403   PHURINE 5.0 10/08/2021 0403   GLUCOSEU NEGATIVE 10/08/2021 0403   HGBUR NEGATIVE 10/08/2021 0403   BILIRUBINUR NEGATIVE 10/08/2021 0403   KETONESUR NEGATIVE 10/08/2021 0403   PROTEINUR NEGATIVE 10/08/2021 0403   NITRITE NEGATIVE 10/08/2021 0403   LEUKOCYTESUR NEGATIVE 10/08/2021 0403   Sepsis Labs Recent Labs  Lab 10/07/21 2128 10/08/21 0541  WBC 8.3 7.4   Microbiology Recent Results (from the past 240 hour(s))  Resp Panel by RT-PCR (Flu A&B, Covid) Anterior Nasal Swab     Status: None   Collection Time: 10/07/21  9:26 PM   Specimen: Anterior Nasal Swab  Result Value Ref Range Status   SARS Coronavirus 2 by RT PCR NEGATIVE NEGATIVE Final    Comment: (NOTE) SARS-CoV-2 target nucleic acids are NOT DETECTED.  The SARS-CoV-2 RNA is generally detectable in upper respiratory specimens during the acute phase of infection. The lowest concentration of SARS-CoV-2 viral copies this assay can detect is 138 copies/mL. A negative result does not preclude SARS-Cov-2 infection and should not be used as the sole basis for treatment or other  patient management decisions. A negative result may occur with  improper specimen collection/handling, submission of specimen other than nasopharyngeal swab, presence of viral mutation(s) within the areas targeted by this assay, and inadequate number of viral copies(<138 copies/mL). A negative result must be combined with clinical observations, patient history, and epidemiological information. The expected result is Negative.  Fact Sheet for Patients:  EntrepreneurPulse.com.au  Fact Sheet for Healthcare Providers:  IncredibleEmployment.be  This test is no t yet approved or cleared by the [place] FDA and  has been authorized for detection and/or diagnosis of SARS-CoV-2 by FDA under an Emergency Use Authorization (EUA). This EUA will remain  in effect (meaning this test can be used) for the duration of the COVID-19 declaration under Section 564(b)(1) of the Act, 21 U.S.C.section 360bbb-3(b)(1), unless the authorization is terminated  or revoked sooner.       Influenza A by PCR NEGATIVE NEGATIVE Final   Influenza B by PCR NEGATIVE NEGATIVE Final    Comment: (NOTE) The Xpert Xpress SARS-CoV-2/FLU/RSV plus assay is intended as an aid in the diagnosis of influenza from Nasopharyngeal swab specimens and should not be used as a sole basis for treatment. Nasal washings and aspirates are unacceptable for Xpert Xpress SARS-CoV-2/FLU/RSV testing.  Fact Sheet for Patients: EntrepreneurPulse.com.au  Fact Sheet for Healthcare Providers: IncredibleEmployment.be  This test is not yet approved or cleared by the [place] FDA and has been authorized for detection and/or diagnosis of SARS-CoV-2 by FDA under an Emergency Use Authorization (EUA). This EUA will remain in effect (meaning this test can be used) for the duration of the COVID-19 declaration under Section 564(b)(1) of the Act, 21 U.S.C. section  360bbb-3(b)(1), unless the authorization is terminated or revoked.  Performed at [facility], [place] [address], [place], [place] [address]      Time coordinating discharge:  I have spent 35 minutes face to face with the patient and on the ward discussing the patients care, assessment, plan and disposition with other care givers. >50% of the time was devoted counseling the patient about the risks and benefits of treatment/Discharge disposition and coordinating care.   SIGNED:   Damita Lack, MD  [facility] 10/12/2021, 11:26 AM   If 7PM-7AM, please contact night-coverage

## 2021-10-11 NOTE — Discharge Instructions (Signed)
Standard seizure precautions: Per [place] DMV statutes, patients with seizures are not allowed to drive until  they have been seizure-free for six months. Use caution when using heavy equipment or power tools. Avoid working on ladders or at heights. Take showers instead of baths. Ensure the water temperature is not too high on the home water heater. Do not go swimming alone. When caring for infants or small children, sit down when holding, feeding, or changing them to minimize risk of injury to the child in the event you have a seizure.  To reduce risk of seizures, maintain good sleep hygiene avoid alcohol and illicit drug use, take all anti-seizure medications as prescribed.  

## 2021-10-11 NOTE — Progress Notes (Signed)
Neurology Progress Note  Patient ID: Hunter Chang is a 76 y.o. with PMHx of  has a past medical history of Arthritis, Chicken pox, German measles, and Trigeminal neuralgia.  Initially consulted for: Code stroke found to be having a focal seizure of the left leg  Subjective: -Feels fully back to his baseline with no persistent numbness and no weakness -No further shaking events -No headaches or systemic symptoms -Tolerating Keppra well without any side effects -Notes he has not had any episodes of his trigeminal neuralgia since starting Keppra and being treated with steroids   Exam: Current vital signs: BP 125/79 (BP Location: Right Arm)   Pulse 78   Temp 98.5 F (36.9 C) (Oral)   Resp 18   Ht '5\' 8"'$  (1.727 m)   Wt 77.6 kg   SpO2 97%   BMI 26.01 kg/m  Vital signs in last 24 hours: Temp:  [97.3 F (36.3 C)-98.5 F (36.9 C)] 98.5 F (36.9 C) (07/17 0735) Pulse Rate:  [65-84] 78 (07/17 0735) Resp:  [15-20] 18 (07/17 0735) BP: (122-165)/(74-82) 125/79 (07/17 0735) SpO2:  [96 %-97 %] 97 % (07/17 0735) Weight:  [77.6 kg] 77.6 kg (07/17 0500)   Gen: In bed, comfortable  Resp: non-labored breathing, no grossly audible wheezing Cardiac: Perfusing extremities well  Abd: soft, nt  Neuro: MS: Awake, alert, oriented to person place and situation CN: EOMI with mildly saccadic pursuits, pupils equal and round, face with a very subtle left facial droop, symmetric slow sensation bilaterally in the face Motor: Mild pronation without drift of the right upper extremity, no pronation of the left upper extremity.  5/5 strength throughout the bilateral lower extremities Sensory: Symmetric sensation to temperature and light touch in the face arm and leg  Pertinent Labs:  Basic Metabolic Panel: Recent Labs  Lab 10/04/21 1227 10/07/21 2128 10/07/21 2241 10/08/21 0541 10/10/21 0208 10/11/21 0432  NA 137 137 137 139 136 137  K 3.9 3.6 3.6 3.4* 3.9 3.8  CL 99 101 102 103 105 103   CO2 30 24  --  '26 24 25  '$ GLUCOSE 94 158* 154* 105* 150* 149*  BUN '12 17 18 13 18 20  '$ CREATININE 0.97 1.07 1.10 0.86 0.80 0.82  CALCIUM 9.9 9.7  --  8.9 9.3 9.3  MG  --  2.0  --  2.1 2.3 2.3    CBC: Recent Labs  Lab 10/07/21 2128 10/07/21 2241 10/08/21 0541  WBC 8.3  --  7.4  NEUTROABS 5.8  --  4.8  HGB 13.9 13.9 12.7*  HCT 40.7 41.0 36.6*  MCV 87.3  --  85.9  PLT 350  --  283    Impression: Discussed in tumor board, per Dr. 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 Extended discussion with family (daughter at bedside, wife joining via video call).  Imaging reviewed with family as well as tumor board discussion as relayed to myself.  At this time the patient is at his baseline and well controlled on Keppra.  Given he will likely need surgical debridement, at which time sample could be obtained for guiding antimicrobial treatment, LP not indicated at this time especially given his clinical improvement and overall nontoxic examination  Family at bedside had multiple questions about further surgical management which I defer to Dr. Ellene Route  Recommendations: -Stop steroids, contraindicated given high concern for infection -Convert Keppra to 500 mg twice daily oral from  IV -Defer discussion of surgical management to Dr. Ellene Route -Seizure precautions discussed with family, recommend no driving until outpatient neurology follow-up.  If patient remains well controlled on Keppra 500 mg twice daily given that he only had focal seizures, outpatient neurologist may be able to write a letter to the [facility] in support of reduced driving restrictions.  Defer to outpatient neurologist based on patient's clinical course but recommended no driving at this time.  Please include seizure precautions detailed below in discharge instructions -Neurology will be available on an as-needed basis going forward,  please reach out if any questions or concerns arise  Standard seizure precautions: Per [facility] statutes, patients with seizures are not allowed to drive until  they have been seizure-free for six months. Use caution when using heavy equipment or power tools. Avoid working on ladders or at heights. Take showers instead of baths. Ensure the water temperature is not too high on the home water heater. Do not go swimming alone. When caring for infants or small children, sit down when holding, feeding, or changing them to minimize risk of injury to the child in the event you have a seizure.  To reduce risk of seizures, maintain good sleep hygiene avoid alcohol and illicit drug use, take all anti-seizure medications as prescribed.   Lesleigh Noe MD-PhD [facility] [phone]

## 2021-10-11 NOTE — Progress Notes (Addendum)
Inpatient Rehab Admissions Coordinator:  Spoke with pt and his wife [name]. Both are in agreement about pt pursuing CIR. Will begin insurance authorization. Will continue to follow.  ADDEDUM 1536: Per Dr. Reesa Chew, pt may be transferring to outside facility for further treatment. Pt will not need CIR.Charna Archer will sign off.    Gayland Curry, [place], [place] Admissions Coordinator [phone]

## 2021-10-11 NOTE — TOC Initial Note (Signed)
Transition of Care [facility]) - Initial/Assessment Note    Patient Details  Name: Hunter Chang MRN: [id number] Date of Birth: 08/14/1945  [facility]) CM/SW Contact:    Pollie Friar, RN Phone Number: 10/11/2021, 1:44 PM  Clinical Narrative:                 Per Lauren with CIR pt  is agreeable to CIR and insurance is being started for an admission to rehab.  TOC following.  Expected Discharge Plan: IP Rehab Facility Barriers to Discharge: Continued Medical Work up   Patient Goals and CMS Choice   CMS Medicare.gov Compare Post Acute Care list provided to:: Patient Choice offered to / list presented to : Patient, Spouse  Expected Discharge Plan and Services Expected Discharge Plan: [place]     Post Acute Care Choice: IP Rehab                                        Prior Living Arrangements/Services                       Activities of Daily Living Home Assistive Devices/Equipment: None ADL Screening (condition at time of admission) Patient's cognitive ability adequate to safely complete daily activities?: Yes Is the patient deaf or have difficulty hearing?: No Does the patient have difficulty seeing, even when wearing glasses/contacts?: No Does the patient have difficulty concentrating, remembering, or making decisions?: No Patient able to express need for assistance with ADLs?: Yes Does the patient have difficulty dressing or bathing?: Yes Independently performs ADLs?: No Communication: Independent Dressing (OT): Needs assistance Is this a change from baseline?: Change from baseline, expected to last >3 days Grooming: Independent Feeding: Independent Bathing: Needs assistance Is this a change from baseline?: Change from baseline, expected to last >3 days Toileting: Needs assistance Is this a change from baseline?: Change from baseline, expected to last >3days In/Out Bed: Needs assistance Is this a change from baseline?: Change  from baseline, expected to last >3 days Walks in Home: Needs assistance Is this a change from baseline?: Change from baseline, expected to last >3 days Does the patient have difficulty walking or climbing stairs?: Yes Weakness of Legs: Left Weakness of Arms/Hands: None  Permission Sought/Granted                  Emotional Assessment              Admission diagnosis:  Subdural hemorrhage ([place]) [I62.00] Seizure ([place]) [R56.9] Seizures ([place]) [R56.9] Left-sided weakness [R53.1] Patient Active Problem List   Diagnosis Date Noted   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 [place] [place]: [phone]: [phone]  [place] [address] Phone: [phone] Fax: [phone]     Social Determinants of Health (SDOH) Interventions    Readmission Risk Interventions     No data to display

## 2021-10-11 NOTE — Progress Notes (Signed)
PROGRESS NOTE    Hunter Chang  OVF:[id number] DOB: January 07, 1946 DOA: 10/07/2021 PCP: Binnie Rail, MD   Brief Narrative:  76 year old with history of trigeminal neuralgia to the hospital for acute focal seizure of left lower extremity at home.  CTA head and MRI brain were negative during recent admission.  Admitted for presumed seizures, neurology team was consulted.  EEG was started on the patient.  Neurology spoke with neurooncology who will discuss his case report, started high-dose steroids.  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  At this time plan is to continue Keppra, discontinue steroids.  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   - EEG-suggestive of cortical dysfunction in the right temporoparietal lobe area with no obvious seizures - Dr. Mickeal Skinner presented case at tumor board today and I was notified by neurology, Dr. Curly [place]: This is likely a surgical site infection from tumor resection/radiation and reactive changes which will likely require surgical intervention with Intra-Op cultures and antibiotic treatment.  In the meantime continue Keppra, seizure precautions and outpatient follow-up with neurology. Will consult ID as well.  Neurosurgery to reevaluate the patient.  Acute metabolic encephalopathy -  Resolved  Hypokalemia - Replete as necessary   PT/OT = CIR. CIR team consulted.    DVT prophylaxis: SCDs Start: 10/07/21 2338 Code Status: Full Code Family Communication: Family at bedside Status is: Inpatient Remains inpatient appropriate because:  Subjective:  Seen and examined at bedside.  No complaints  Examination:  Constitutional: Not in acute distress Respiratory: Clear to auscultation bilaterally Cardiovascular: Normal sinus rhythm, no rubs Abdomen: Nontender nondistended good bowel sounds Musculoskeletal: No edema noted Skin: Skin on the right scalp has lesion around right parietal area. Neurologic: CN 2-12 grossly intact.  And nonfocal Psychiatric: Normal judgment and insight. Alert and oriented x 3. Normal mood.  Objective: Vitals:   10/10/21 2150 10/10/21 2339 10/11/21 0500 10/11/21 0735  BP: (!) 165/82 (!) 152/78 128/76 125/79  Pulse:  84 65 78  Resp: '18 16 15 18  '$ Temp: 98.2 F (36.8 C) 97.7 F (36.5 C) 97.6 F (36.4 C) 98.5 F (36.9 C)  TempSrc: Oral Oral Oral Oral  SpO2: 96% 97% 97% 97%  Weight:   77.6 kg   Height:        Intake/Output Summary (Last 24 hours) at 10/11/2021 1106 Last data filed at 10/11/2021 0800 Gross per 24 hour  Intake 250 ml  Output 1050 ml  Net -800 ml   Filed Weights   10/09/21 0510 10/10/21 0500 10/11/21 0500  Weight: 76.5 kg 76.7 kg 77.6 kg     Data Reviewed:   CBC: Recent Labs  Lab 10/07/21 2128 10/07/21 2241 10/08/21 0541  WBC 8.3  --  7.4  NEUTROABS 5.8  --  4.8  HGB 13.9 13.9 12.7*  HCT 40.7 41.0 36.6*  MCV 87.3  --  85.9  PLT 350  --  756   Basic Metabolic Panel: Recent Labs  Lab 10/04/21 1227 10/07/21 2128 10/07/21 2241 10/08/21 0541 10/10/21 0208 10/11/21 0432  NA 137 137 137 139 136 137  K 3.9 3.6 3.6 3.4* 3.9 3.8  CL 99 101 102 103 105 103  CO2 30 24  --  '26 24 25  '$ GLUCOSE 94 158* 154* 105* 150* 149*  BUN '12 17 18 13 18 20  '$ CREATININE 0.97 1.07 1.10 0.86 0.80 0.82  CALCIUM 9.9 9.7   --  8.9 9.3 9.3  MG  --  2.0  --  2.1 2.3 2.3   GFR: Estimated Creatinine Clearance: 75.3 mL/min (by C-G formula based on SCr of 0.82 mg/dL). Liver Function Tests: Recent Labs  Lab 10/04/21 1227 10/07/21 2128 10/08/21 0541  AST '26 24 20  '$ ALT 41 36 30  ALKPHOS 170* 163* 134*  BILITOT 0.4 0.3 0.4  PROT 7.7 7.3 6.5  ALBUMIN 4.0 3.6 3.0*   No results for input(s): "LIPASE", "AMYLASE" in the last 168 hours. No results for input(s): "AMMONIA" in the last 168 hours. Coagulation Profile: Recent Labs  Lab 10/07/21 2128  INR 1.0   Cardiac Enzymes: No results for input(s): "CKTOTAL", "CKMB", "CKMBINDEX", "TROPONINI" in the last 168 hours. BNP (last 3 results) No results for input(s): "PROBNP" in the last 8760 hours. HbA1C: No results for input(s): "HGBA1C" in the last 72 hours. CBG: Recent Labs  Lab 10/07/21 2126  GLUCAP 157*   Lipid Profile: No results for input(s): "CHOL", "HDL", "LDLCALC", "TRIG", "CHOLHDL", "LDLDIRECT" in the last 72 hours. Thyroid Function Tests: No results for input(s): "TSH", "T4TOTAL", "FREET4", "T3FREE", "THYROIDAB" in the last 72 hours. Anemia Panel: No results for input(s): "VITAMINB12", "FOLATE", "FERRITIN", "TIBC", "IRON", "RETICCTPCT" in the last 72 hours. Sepsis Labs: No results for input(s): "PROCALCITON", "LATICACIDVEN" in the last 168 hours.  Recent Results (from the past 240 hour(s))  Resp Panel by RT-PCR (Flu A&B, Covid) Anterior Nasal Swab     Status: None   Collection Time: 10/07/21  9:26 PM   Specimen: Anterior Nasal Swab  Result Value Ref Range Status   SARS Coronavirus 2 by RT PCR NEGATIVE NEGATIVE Final    Comment: (NOTE) SARS-CoV-2 target nucleic acids are NOT DETECTED.  The SARS-CoV-2 RNA is generally detectable in upper respiratory specimens during the acute phase of infection. The lowest concentration of SARS-CoV-2 viral copies this assay can detect is 138 copies/mL. A negative result does not preclude  SARS-Cov-2 infection and should not be used as the sole basis for treatment or other patient management decisions. A negative result may occur with  improper specimen collection/handling, submission of specimen other than nasopharyngeal swab, presence of viral mutation(s) within the areas targeted by this assay, and inadequate number of viral copies(<138 copies/mL). A negative result must be combined with clinical observations, patient history, and epidemiological information. The expected result is Negative.  Fact Sheet for Patients:  EntrepreneurPulse.com.au  Fact Sheet for Healthcare Providers:  IncredibleEmployment.be  This test is no t yet approved or cleared by the [place] FDA and  has been authorized for detection and/or diagnosis of SARS-CoV-2 by FDA under an Emergency Use Authorization (EUA). This EUA will remain  in effect (meaning this test can be used) for the duration of the COVID-19 declaration under Section 564(b)(1) of the Act, 21 U.S.C.section 360bbb-3(b)(1), unless the authorization is terminated  or revoked sooner.       Influenza A by PCR NEGATIVE NEGATIVE Final   Influenza B by PCR NEGATIVE  NEGATIVE Final    Comment: (NOTE) The Xpert Xpress SARS-CoV-2/FLU/RSV plus assay is intended as an aid in the diagnosis of influenza from Nasopharyngeal swab specimens and should not be used as a sole basis for treatment. Nasal washings and aspirates are unacceptable for Xpert Xpress SARS-CoV-2/FLU/RSV testing.  Fact Sheet for Patients: EntrepreneurPulse.com.au  Fact Sheet for Healthcare Providers: IncredibleEmployment.be  This test is not yet approved or cleared by the [place] FDA and has been authorized for detection and/or diagnosis of SARS-CoV-2 by FDA under an Emergency Use Authorization (EUA). This EUA will remain in effect (meaning this test can be used) for the duration of  the COVID-19 declaration under Section 564(b)(1) of the Act, 21 U.S.C. section 360bbb-3(b)(1), unless the authorization is terminated or revoked.  Performed at [facility], [place] [address], [place], [place] [address]          Radiology Studies: No results found.      Scheduled Meds:  levETIRAcetam  500 mg Oral BID   Continuous Infusions:     LOS: 3 days   Time spent= 35 mins    Kambria Grima Arsenio Loader, MD [facility]  If 7PM-7AM, please contact night-coverage  10/11/2021, 11:06 AM

## 2021-10-11 NOTE — Progress Notes (Signed)
Occupational Therapy Treatment Patient Details Name: Hunter Chang MRN: [id number] DOB: 01-Jan-1946 Today's Date: 10/11/2021   History of present illness 76 y.o. male presents to [facility] on 10/07/2021 with suspected acute seizure involving his LLE after having a fall at home. EEG performed 10/08/2021 that suggests cortical dysfunction in R temporoparietal region. PMH: arthritis, trigeminal neuralgia   OT comments  Pt progressing towards established OT goals, and highly motivated to participate in therapy. Pt requiring min guard for LB dressing, toilet transfer, and functional mobility using RW during session. Pt required min verbal cues for safety during transfers and for RW management during functional mobility to open door, approach restroom, and perform sit<>stand. Pt continues to present with decreased balance, awareness, safety, problem solving, and ability to follow commands. Pt and family are highly motivated for him to participate in AIR. Continue to recommend discharge at AIR and will continue to follow acutely to optimize functional mobility and independence in ADLs and IADLs.    Recommendations for follow up therapy are one component of a multi-disciplinary discharge planning process, led by the attending physician.  Recommendations may be updated based on patient status, additional functional criteria and insurance authorization.    Follow Up Recommendations  Acute inpatient rehab (3hours/day)    Assistance Recommended at Discharge Frequent or constant Supervision/Assistance  Patient can return home with the following  A little help with walking and/or transfers;A little help with bathing/dressing/bathroom;Assistance with cooking/housework;Direct supervision/assist for medications management;Direct supervision/assist for financial management;Help with stairs or ramp for entrance;Assist for transportation   Equipment Recommendations  Other (comment) (TBD)    Recommendations for  Other Services Rehab consult    Precautions / Restrictions Precautions Precautions: Fall Restrictions Weight Bearing Restrictions: No       Mobility Bed Mobility Overal bed mobility: Modified Independent Bed Mobility: Supine to Sit     Supine to sit: Modified independent (Device/Increase time)          Transfers Overall transfer level: Needs assistance Equipment used: Rolling walker (2 wheels) Transfers: Sit to/from Stand Sit to Stand: Min guard           General transfer comment: Pt requires min VCs for hand placement and safety during sit<>stand transfers.     Balance Overall balance assessment: Needs assistance Sitting-balance support: No upper extremity supported, Feet supported Sitting balance-Leahy Scale: Good Sitting balance - Comments: Pt with skill to doff and don socks sitting EOB   Standing balance support: Bilateral upper extremity supported, During functional activity Standing balance-Leahy Scale: Poor Standing balance comment: Pt reliant on BUE support during functional mobility and dynamic standing tasks.                           ADL either performed or assessed with clinical judgement   ADL Overall ADL's : Needs assistance/impaired     Grooming: Min guard;Standing;Wash/dry hands               Lower Body Dressing: Min guard;Sit to/from stand   Toilet Transfer: Min guard;Ambulation;Rolling walker (2 wheels)           Functional mobility during ADLs: Min guard;Rolling walker (2 wheels)      Extremity/Trunk Assessment Upper Extremity Assessment Upper Extremity Assessment: Generalized weakness;LUE deficits/detail LUE Deficits / Details: Poor motor planning and proprioception. LUE Sensation: decreased proprioception LUE Coordination: decreased fine motor   Lower Extremity Assessment Lower Extremity Assessment: Defer to PT evaluation  Vision   Vision Assessment?: No apparent visual deficits   Perception      Praxis      Cognition Arousal/Alertness: Awake/alert Behavior During Therapy: WFL for tasks assessed/performed Overall Cognitive Status: Impaired/Different from baseline Area of Impairment: Following commands, Awareness, Safety/judgement, Problem solving, Memory                     Memory: Decreased short-term memory Following Commands: Follows one step commands consistently Safety/Judgement: Decreased awareness of safety, Decreased awareness of deficits Awareness: Emergent Problem Solving: Requires verbal cues General Comments: Pt eldest daughter and wife present during session. Pt with skill to recall fall last night, however, potential STM deficits d/t wife and daughter report of medication change being different than the report of patient. Pt requires slightly increased time for processing, and occasionally requires verbal cues for recall and safety awareness. Pt with emergent awareness stating that he is on "bed arrest" in reference to the bed alarm, and with beginning anticipatory awareness reporting that he performs mobility better with RW.        Exercises      Shoulder Instructions       General Comments VSS on RA; Pt, wife, and daughter are highly motivated, requesting to attend AIR here at [facility] prior to his return home.    Pertinent Vitals/ Pain       Pain Assessment Pain Assessment: No/denies pain  Home Living                                          Prior Functioning/Environment              Frequency  Min 2X/week        Progress Toward Goals  OT Goals(current goals can now be found in the care plan section)  Progress towards OT goals: Progressing toward goals  Acute Rehab OT Goals Patient Stated Goal: To go to AIR OT Goal Formulation: With patient/family Time For Goal Achievement: 10/22/21 Potential to Achieve Goals: Good ADL Goals Pt Will Perform Grooming: with modified independence;standing Pt Will Perform Lower  Body Bathing: with modified independence;sitting/lateral leans;sit to/from stand Pt Will Perform Lower Body Dressing: with modified independence;sit to/from stand;sitting/lateral leans Pt Will Transfer to Toilet: with modified independence;ambulating Pt Will Perform Toileting - Clothing Manipulation and hygiene: with modified independence;sitting/lateral leans;sit to/from stand  Plan Discharge plan remains appropriate    Co-evaluation                 AM-PAC OT "6 Clicks" Daily Activity     Outcome Measure   Help from another person eating meals?: A Little Help from another person taking care of personal grooming?: A Little Help from another person toileting, which includes using toliet, bedpan, or urinal?: A Little Help from another person bathing (including washing, rinsing, drying)?: A Little Help from another person to put on and taking off regular upper body clothing?: A Little Help from another person to put on and taking off regular lower body clothing?: A Little 6 Click Score: 18    End of Session Equipment Utilized During Treatment: Gait belt;Rolling walker (2 wheels)  OT Visit Diagnosis: Unsteadiness on feet (R26.81);Other abnormalities of gait and mobility (R26.89);Muscle weakness (generalized) (M62.81);Ataxia, unspecified (R27.0)   Activity Tolerance Patient tolerated treatment well   Patient Left in bed;with call bell/phone within reach;with family/visitor present   Nurse Communication Mobility  status        Time: 1205-1225 OT Time Calculation (min): 20 min  Charges: OT General Charges $OT Visit: 1 Visit OT Treatments $Self Care/Home Management : 8-22 mins  Shanda Howells, OTR/L [facility] Acute Rehabilitation Office: [phone]  Lula Olszewski 10/11/2021, 1:24 PM

## 2021-10-11 NOTE — Progress Notes (Signed)
   10/10/21 2150  What Happened  Was fall witnessed? No  Was patient injured? No  Patient found in bathroom  Found by  (Pts. family)  Stated prior activity shower  Follow Up  MD notified Irene Pap, DO  Time MD notified 2153  Family notified Yes - comment (Daughter at bedside)  Time family notified 2145  Additional tests No  Simple treatment Other (comment) (Pt. denies pain or injury)  Adult Fall Risk Assessment  Risk Factor Category (scoring not indicated) Fall has occurred during this admission (document High fall risk)  Patient Fall Risk Level High fall risk  Adult Fall Risk Interventions  Required Bundle Interventions *See Row Information* High fall risk - low, moderate, and high requirements implemented  Additional Interventions Use of appropriate toileting equipment (bedpan, BSC, etc.)  Screening for Fall Injury Risk (To be completed on HIGH fall risk patients) - Assessing Need for Floor Mats  Risk For Fall Injury- Criteria for Floor Mats Previous fall this admission  Will Implement Floor Mats Yes  Vitals  Temp 98.2 F (36.8 C)  Temp Source Oral  BP (!) 165/82  ECG Heart Rate (!) 116  Resp 18  Oxygen Therapy  SpO2 96 %  O2 Device Room Air  Pain Assessment  Pain Scale 0-10  Pain Score 0  Neurological  Neuro (WDL) WDL  Musculoskeletal  Musculoskeletal (WDL) X  Assistive Device Front wheel walker  Generalized Weakness Yes  Weight Bearing Restrictions No  Integumentary  Integumentary (WDL) X  Skin Integrity Other (Comment) (see LDA)

## 2021-10-11 NOTE — Progress Notes (Signed)
Pt. sustained a fall getting dressed in the shower. Denies pain, loss of consciousness, or hitting his head. No signs of injury. Pt. States he caught himself with his right arm and landed on his right buttocks after tripping while putting on his underwear. Dr. Irene Pap made aware of fall and Pts. Vitals. Order for shower privileges discontinued by provider.

## 2021-10-11 NOTE — Progress Notes (Signed)
Patient ID: Hunter Chang, male   DOB: March 28, 1946, 76 y.o.   MRN: [id number] Patient is awake and alert coherent conversant and has been moving about.  He is not complaining of any headache or significant symptoms but feels weak and has some modest weakness on his left side.  His situation was discussed in the multidisciplinary cancer conference.  I noted that my concern is that he has a significant defect on his scalp and his skull and the changes that we see on MRI may be reflective of inflammatory changes in the skull itself as it is degrading.  My feeling is that the patient will need an operation to cover the defect by excising good portion of the necrotic skull and providing skin for closure which may require a free flap I would defer this to a combined procedure between neurosurgery and plastic surgery.  I have reached out to his scalp and neck surgeon Dr. Irineo Axon and I am waiting to hear back from him.  Ideally I believe this case should be handled in a university setting with a multidisciplinary approach.  I discussed the situation with [name] his wife and his daughters who are present today and we will see what we can do to either arrange transfer or an appropriate follow-up so that this process can go forward.

## 2021-10-12 DIAGNOSIS — Z79899 Other long term (current) drug therapy: Secondary | ICD-10-CM | POA: Diagnosis not present

## 2021-10-12 DIAGNOSIS — Z923 Personal history of irradiation: Secondary | ICD-10-CM | POA: Diagnosis not present

## 2021-10-12 DIAGNOSIS — G500 Trigeminal neuralgia: Secondary | ICD-10-CM | POA: Diagnosis not present

## 2021-10-12 DIAGNOSIS — R569 Unspecified convulsions: Secondary | ICD-10-CM | POA: Diagnosis not present

## 2021-10-12 DIAGNOSIS — C490 Malignant neoplasm of connective and soft tissue of head, face and neck: Secondary | ICD-10-CM | POA: Diagnosis not present

## 2021-10-12 DIAGNOSIS — Z9889 Other specified postprocedural states: Secondary | ICD-10-CM | POA: Diagnosis not present

## 2021-10-12 DIAGNOSIS — I6200 Nontraumatic subdural hemorrhage, unspecified: Secondary | ICD-10-CM | POA: Diagnosis not present

## 2021-10-12 LAB — BASIC METABOLIC PANEL
Anion gap: 12 (ref 5–15)
BUN: 18 mg/dL (ref 8–23)
CO2: 26 mmol/L (ref 22–32)
Calcium: 9 mg/dL (ref 8.9–10.3)
Chloride: 101 mmol/L (ref 98–111)
Creatinine, Ser: 0.88 mg/dL (ref 0.61–1.24)
GFR, Estimated: >60 mL/min (ref >60)
Glucose, Bld: 106 mg/dL — ABNORMAL HIGH (ref 70–99)
Potassium: 3.3 mmol/L — ABNORMAL LOW (ref 3.5–5.1)
Sodium: 139 mmol/L (ref 135–145)

## 2021-10-12 LAB — MAGNESIUM: Magnesium: 2.1 mg/dL (ref 1.7–2.4)

## 2021-10-12 MED ORDER — POTASSIUM CHLORIDE 20 MEQ PO PACK
40.0000 meq | PACK | Freq: Once | ORAL | Status: AC
  Administered 2021-10-12: 40 meq via ORAL
  Filled 2021-10-12: qty 2

## 2021-10-12 NOTE — Progress Notes (Signed)
Physical Therapy Treatment Patient Details Name: Hunter Chang MRN: [id number] DOB: Feb 22, 1946 Today's Date: 10/12/2021   History of Present Illness 76 y.o. male presents to [facility] on 10/07/2021 with suspected acute seizure involving his LLE after having a fall at home. EEG performed 10/08/2021 that suggests cortical dysfunction in R temporoparietal region. PMH: arthritis, trigeminal neuralgia    PT Comments    Remains motivated and engaged with physical therapy. Focused on ambulation with and without AD, dynamic and static balance training. Safety awareness, progress, remaining deficits, and POC reviewed with pt and wife. Patient will continue to benefit from skilled physical therapy services to further improve independence with functional mobility.Will follow and progress as tolerated until transfer.    Recommendations for follow up therapy are one component of a multi-disciplinary discharge planning process, led by the attending physician.  Recommendations may be updated based on patient status, additional functional criteria and insurance authorization.  Follow Up Recommendations   (Previously AIR - to be determined at next venue of care once transferred)     Assistance Recommended at Discharge Frequent or constant Supervision/Assistance  Patient can return home with the following A little help with walking and/or transfers;A little help with bathing/dressing/bathroom;Assistance with cooking/housework;Direct supervision/assist for medications management;Assist for transportation;Help with stairs or ramp for entrance   Equipment Recommendations  None recommended by PT (next venue of care)    Recommendations for Other Services       Precautions / Restrictions Precautions Precautions: Fall Restrictions Weight Bearing Restrictions: No     Mobility  Bed Mobility Overal bed mobility: Modified Independent Bed Mobility: Supine to Sit, Sit to Supine     Supine to sit: Modified  independent (Device/Increase time) Sit to supine: Modified independent (Device/Increase time)   General bed mobility comments: No assist required. using rail to get in/out of bed.    Transfers Overall transfer level: Needs assistance Equipment used: Rolling walker (2 wheels) Transfers: Sit to/from Stand Sit to Stand: Min guard           General transfer comment: Min guard and cues for hand placement. practiced with and without RW. Minimal instability noted without AD upon standing but able to self correct without physical intervention    Ambulation/Gait Ambulation/Gait assistance: Min guard Gait Distance (Feet): 275 Feet (additional 100) Assistive device: Rolling walker (2 wheels), None Gait Pattern/deviations: Step-through pattern, Drifts right/left Gait velocity: decreased     General Gait Details: With RW pt amb with good control, difficulty with narrow turns and makes abnormally wide turns when turning around. Without AD pt requires min guard to ambulate through hallway, noticable reduction in gait speed, more hesitant, with slight drift noted at times but able to safely self correct. No LLE buckling noted during bout today.   Stairs             Wheelchair Mobility    Modified Rankin (Stroke Patients Only)       Balance Overall balance assessment: Needs assistance Sitting-balance support: No upper extremity supported, Feet supported Sitting balance-Leahy Scale: Good     Standing balance support: No upper extremity supported Standing balance-Leahy Scale: Fair                              Cognition Arousal/Alertness: Awake/alert Behavior During Therapy: WFL for tasks assessed/performed Overall Cognitive Status: Impaired/Different from baseline Area of Impairment: Following commands, Awareness, Safety/judgement, Problem solving  Memory: Decreased short-term memory Following Commands: Follows one step commands  consistently, Follows multi-step commands inconsistently Safety/Judgement: Decreased awareness of safety, Decreased awareness of deficits Awareness: Emergent Problem Solving: Requires verbal cues General Comments: Seemingly improved from previous notes. Some mild deficits noted, as above        Exercises Other Exercises Other Exercises: Static and dynamic balance challenges including lateral steps along hallway rail, some reduced coordination LLE noted, semi-tandem stance, and heel/toe rocks with min assist for balance, showing good righting reactions reaching for rail when needed.    General Comments        Pertinent Vitals/Pain Pain Assessment Pain Assessment: No/denies pain    Home Living                          Prior Function            PT Goals (current goals can now be found in the care plan section) Acute Rehab PT Goals PT Goal Formulation: With patient Time For Goal Achievement: 10/23/21 Potential to Achieve Goals: Good Progress towards PT goals: Progressing toward goals    Frequency    Min 3X/week      PT Plan Equipment recommendations need to be updated;Discharge plan needs to be updated    Co-evaluation              AM-PAC PT "6 Clicks" Mobility   Outcome Measure  Help needed turning from your back to your side while in a flat bed without using bedrails?: None Help needed moving from lying on your back to sitting on the side of a flat bed without using bedrails?: None Help needed moving to and from a bed to a chair (including a wheelchair)?: A Little Help needed standing up from a chair using your arms (e.g., wheelchair or bedside chair)?: A Little Help needed to walk in hospital room?: A Little Help needed climbing 3-5 steps with a railing? : A Lot 6 Click Score: 19    End of Session Equipment Utilized During Treatment: Gait belt Activity Tolerance: Patient tolerated treatment well Patient left: in bed;with call bell/phone  within reach;with family/visitor present Nurse Communication: Mobility status PT Visit Diagnosis: Unsteadiness on feet (R26.81);Other abnormalities of gait and mobility (R26.89);Muscle weakness (generalized) (M62.81);History of falling (Z91.81)     Time: 6468-0321 PT Time Calculation (min) (ACUTE ONLY): 21 min  Charges:                        Candie Mile, PT    Ellouise Newer 10/12/2021, 9:53 AM

## 2021-10-12 NOTE — Consult Note (Signed)
[place] for Infectious Disease       Reason for Consult:possible infection    Referring Physician: Dr. Reesa Chew  *late entry*  Principal Problem:   Seizures ([place]) Active Problems:   Transient weakness of left lower extremity   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HCC)   Acute encephalopathy   Left-sided weakness    levETIRAcetam  500 mg Oral BID   potassium chloride  40 mEq Oral Once    Recommendations: No antibiotics indicated at this time pending surgical management  Assessment: He has a chronic scalp lesion and concern for infection that will require surgical management.  He would benefit from cultures for bacteria, AFB and fungal cultures of tissue, along with pathology, from any surgical procedure.    Antibiotics: none  HPI: Hunter Chang is a 76 y.o. 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  His symptoms have resolved with the weakness back to his baseline.  He is followed by Dr. Ellene Route and plan for further debridement and a multidisciplinary approach at an academic center.  His scalp has exposed area that has been ongoing.  He initially was started on steroids and this has been stopped.  He has had issues with trigeminal neuralgia that seem to be improved with steroids.      Review of Systems:  Constitutional: negative for fevers and chills All other systems reviewed and are negative    Past Medical History:  Diagnosis Date   Arthritis    Chicken pox    [place] measles    Trigeminal neuralgia     Social History   Tobacco Use   Smoking status: Never   Smokeless tobacco: Never  Vaping Use   Vaping Use: Never used  Substance Use Topics   Alcohol use: No   Drug use: No    Family History  Problem Relation Age of Onset   Arthritis Mother    Diabetes  Father    Early death Father    Heart disease Father    Heart attack Father    Early death Maternal Grandfather    COPD Paternal Grandfather     Allergies  Allergen Reactions   Succinylcholine Chloride Other (See Comments)    "Paralyzed my diaphragm"    Physical Exam: Constitutional: in no apparent distress  Vitals:   10/12/21 0518 10/12/21 0743  BP: (!) 142/85 115/70  Pulse:  65  Resp: 18 18  Temp: (!) 97.5 F (36.4 C) 97.7 F (36.5 C)  SpO2: 97% 95%   EYES: anicteric ENMT: scalp area open scalp defect, dry Respiratory: normal respiratory effort Musculoskeletal: no edema Skin: no rash  Lab Results  Component Value Date   WBC 7.4 10/08/2021   HGB 12.7 (L) 10/08/2021   HCT 36.6 (L) 10/08/2021   MCV 85.9 10/08/2021   PLT 283 10/08/2021    Lab Results  Component Value Date   CREATININE 0.88 10/12/2021   BUN 18 10/12/2021   NA 139 10/12/2021   K 3.3 (L) 10/12/2021   CL 101 10/12/2021   CO2 26 10/12/2021    Lab Results  Component Value Date   ALT 30 10/08/2021   AST 20 10/08/2021   ALKPHOS 134 (H) 10/08/2021     Microbiology: Recent Results (from the past 240 hour(s))  Resp Panel by RT-PCR (Flu  A&B, Covid) Anterior Nasal Swab     Status: None   Collection Time: 10/07/21  9:26 PM   Specimen: Anterior Nasal Swab  Result Value Ref Range Status   SARS Coronavirus 2 by RT PCR NEGATIVE NEGATIVE Final    Comment: (NOTE) SARS-CoV-2 target nucleic acids are NOT DETECTED.  The SARS-CoV-2 RNA is generally detectable in upper respiratory specimens during the acute phase of infection. The lowest concentration of SARS-CoV-2 viral copies this assay can detect is 138 copies/mL. A negative result does not preclude SARS-Cov-2 infection and should not be used as the sole basis for treatment or other patient management decisions. A negative result may occur with  improper specimen collection/handling, submission of specimen other than nasopharyngeal swab, presence of  viral mutation(s) within the areas targeted by this assay, and inadequate number of viral copies(<138 copies/mL). A negative result must be combined with clinical observations, patient history, and epidemiological information. The expected result is Negative.  Fact Sheet for Patients:  EntrepreneurPulse.com.au  Fact Sheet for Healthcare Providers:  IncredibleEmployment.be  This test is no t yet approved or cleared by the [place] FDA and  has been authorized for detection and/or diagnosis of SARS-CoV-2 by FDA under an Emergency Use Authorization (EUA). This EUA will remain  in effect (meaning this test can be used) for the duration of the COVID-19 declaration under Section 564(b)(1) of the Act, 21 U.S.C.section 360bbb-3(b)(1), unless the authorization is terminated  or revoked sooner.       Influenza A by PCR NEGATIVE NEGATIVE Final   Influenza B by PCR NEGATIVE NEGATIVE Final    Comment: (NOTE) The Xpert Xpress SARS-CoV-2/FLU/RSV plus assay is intended as an aid in the diagnosis of influenza from Nasopharyngeal swab specimens and should not be used as a sole basis for treatment. Nasal washings and aspirates are unacceptable for Xpert Xpress SARS-CoV-2/FLU/RSV testing.  Fact Sheet for Patients: EntrepreneurPulse.com.au  Fact Sheet for Healthcare Providers: IncredibleEmployment.be  This test is not yet approved or cleared by the [place] FDA and has been authorized for detection and/or diagnosis of SARS-CoV-2 by FDA under an Emergency Use Authorization (EUA). This EUA will remain in effect (meaning this test can be used) for the duration of the COVID-19 declaration under Section 564(b)(1) of the Act, 21 U.S.C. section 360bbb-3(b)(1), unless the authorization is terminated or revoked.  Performed at [facility], [place] [address] [place], [place] [address]     Alohilani Levenhagen W Odis Turck,  [place] for Infectious Disease [facility] [email or web address] 10/12/2021, 9:26 AM

## 2021-10-12 NOTE — Progress Notes (Signed)
Patient ID: TAE VONADA, male   DOB: May 08, 1945, 76 y.o.   MRN: [id number] Vital signs are stable Had a phone conversation with Dr. Nicolette Bang and patient is being transferred to [facility] sometime today.  Discussed the situation my concerns that there is significant involvement of the inner table subdural effusion and likely cortical irritability.  Films will be sent with the patient.  Have also spoken to Dr. Harrison Mons from neurosurgery as I was not immediately able to get a hold of Dr. Nicolette Bang.  I will sign off at this time.

## 2021-10-12 NOTE — Progress Notes (Signed)
Seen and examined this morning.  No complaints.  No acute events overnight.  Doing well, spouse is at bedside as well.  Patient is awaiting transfer to [place] for further care and management. Discharge summary completed 7/17, updated today. Discussed with Dr Ellene Route as well.   I also requested staff to obtain radiology imaging on a CD to be sent with the patient.  Call with questions as needed Gerlean Ren MD [facility]

## 2021-10-13 DIAGNOSIS — D485 Neoplasm of uncertain behavior of skin: Secondary | ICD-10-CM | POA: Diagnosis not present

## 2021-10-13 DIAGNOSIS — M8788 Other osteonecrosis, other site: Secondary | ICD-10-CM | POA: Diagnosis not present

## 2021-10-14 DIAGNOSIS — D485 Neoplasm of uncertain behavior of skin: Secondary | ICD-10-CM | POA: Diagnosis not present

## 2021-10-14 DIAGNOSIS — M8788 Other osteonecrosis, other site: Secondary | ICD-10-CM | POA: Diagnosis not present

## 2021-10-18 ENCOUNTER — Telehealth: Payer: Self-pay | Admitting: *Deleted

## 2021-10-18 DIAGNOSIS — L570 Actinic keratosis: Secondary | ICD-10-CM | POA: Diagnosis not present

## 2021-10-18 DIAGNOSIS — D234 Other benign neoplasm of skin of scalp and neck: Secondary | ICD-10-CM | POA: Diagnosis not present

## 2021-10-18 DIAGNOSIS — L989 Disorder of the skin and subcutaneous tissue, unspecified: Secondary | ICD-10-CM | POA: Diagnosis not present

## 2021-10-18 NOTE — Telephone Encounter (Signed)
Received call from patients wife.  She reports patient was in patient for 5 day at [facility] and then was transferred to [facility] for 3 more days.  [facility] discontinued his Tegrotol and started him on Keppra.    Tolerating the Keppra well right now.  Wife wanted to know if he needed an appt to review that new medication or if he would take over the refills for Keppra.    Pharmacy on file is [facility].    Patient is going to have [place] at [facility] on 11/04/2021.

## 2021-10-18 NOTE — Telephone Encounter (Signed)
Spoke with patients wife.  Expressed understanding.  Will call to schedule follow up once radiation is completed.

## 2021-10-21 ENCOUNTER — Encounter: Payer: Self-pay | Admitting: Internal Medicine

## 2021-10-21 ENCOUNTER — Encounter: Payer: Self-pay | Admitting: Family Medicine

## 2021-10-21 ENCOUNTER — Telehealth: Payer: Self-pay | Admitting: Physical Therapy

## 2021-10-21 ENCOUNTER — Encounter: Payer: Self-pay | Admitting: Physical Therapy

## 2021-10-21 ENCOUNTER — Ambulatory Visit: Payer: PPO | Attending: Physician Assistant | Admitting: Physical Therapy

## 2021-10-21 DIAGNOSIS — M6281 Muscle weakness (generalized): Secondary | ICD-10-CM | POA: Diagnosis not present

## 2021-10-21 DIAGNOSIS — R2689 Other abnormalities of gait and mobility: Secondary | ICD-10-CM | POA: Diagnosis not present

## 2021-10-21 NOTE — Telephone Encounter (Deleted)
Dr. Volanda Napoleon,  Perrin Smack was evaluated by PT on 10/21/2021.  The patient would benefit from an OT evaluation for LUE weakness and coordination impairments.   If you agree, please place an order in [facility] workque in [facility] or fax the order to [phone].  Thank you, Excell Seltzer, PT, DPT, [facility] [address] [place], [place]  [address] Phone:  [phone] Fax:  [phone]

## 2021-10-21 NOTE — Therapy (Signed)
OUTPATIENT PHYSICAL THERAPY NEURO EVALUATION   Patient Name: Hunter Chang MRN: [id number] DOB:12-19-1945, 76 y.o., male Today's Date: 10/21/2021   PCP: Binnie Rail, MD REFERRING PROVIDER: Nicanor Bake, PA-C    PT End of Session - 10/21/21 1156     Visit Number 1    Number of Visits 13   plus eval   Date for PT Re-Evaluation 12/02/21    Authorization Type [employer]    PT Start Time 1155    PT Stop Time 1235    PT Time Calculation (min) 40 min    Equipment Utilized During Treatment Gait belt    Activity Tolerance Patient tolerated treatment well    Behavior During Therapy WFL for tasks assessed/performed             Past Medical History:  Diagnosis Date   Arthritis    Chicken pox    [place] measles    Trigeminal neuralgia    Past Surgical History:  Procedure Laterality Date   HERNIA REPAIR     3 times   Patient Active Problem List   Diagnosis Date Noted   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 81 for Muscle weakness (generalized)  THERAPY DIAG:  Muscle weakness (generalized)  Other abnormalities of gait and mobility  Rationale for Evaluation and Treatment Rehabilitation  SUBJECTIVE:                                                                                                                                                                                              SUBJECTIVE STATEMENT: [name] wife reports that the had a TIA last night with onset of LUE and LLE weakness. Pt and wife report that pt's  strength has improved from yesterday and medical team is aware of TIA and that they have been informed that pt may have them until gamma knife radiation. They report the goal of the gamma knife radiation is to shrink pt's tumor, start date is mid-August. Pt is now on [place] since onset of possible seizure.  Pt accompanied by: self and significant other, [name]  PERTINENT HISTORY: PMH: arthritis, trigeminal neuralgia, acute on chronic intracranial subdural hematoma, skin fibroxanthoma, possible seizure disorder  PAIN:  Are you having pain? No, not currently  Pt does report he occasionally has pain above his R eye, unsure if related to trigeminal neuralgia or tumor.   PRECAUTIONS: Fall  WEIGHT BEARING RESTRICTIONS No  FALLS: Has patient fallen in last 6 months? Yes. Number of falls 2. Fell at home several times in the past month due to LLE giving out.  LIVING ENVIRONMENT: Lives with: lives with their spouse Lives in: House/apartment Stairs: No Has following equipment at home: Environmental consultant - 2 wheeled, Environmental consultant - 4 wheeled, and shower chair  PLOF: Needs assistance with gait and Needs assistance with transfers  OCCUPATION:  Retired Theme park manager  PATIENT GOALS strengthen LLE and LUE, to be able to hold something with LUE, return to playing tennis and active lifestyle  OBJECTIVE:   DIAGNOSTIC FINDINGS:  BRAIN MRI 10/07/2021 IMPRESSION: 1. Interval rapid development of expansile edema in the subcortical right parietal lobe with irregular overlying dural enhancement subjacent to prior scalp/calvarial postsurgical change. 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 Encephalitis is a differential consideration. The adjacent superior sagittal sinus is small, but appears patent making dural  venous sinus thrombosis unlikely. A follow-up MRI after resolution of the patient's subdural hematoma may be helpful to ensure that findings are not reactive given adjacent hematoma. 2. Similar versus mildly increased size of a right cerebral convexity subdural hematoma which measures up to 12 mm superiorly, probably largely due to redistribution. Mass effect is similar with approximately 2-3 mm of leftward midline shift.  COGNITION: Overall cognitive status: Within functional limits for tasks assessed   SENSATION: WFL  COORDINATION: Slight dysmetria LUE, heel to shin WFL bilaterally   POSTURE: rounded shoulders and forward head    LOWER EXTREMITY MMT:    MMT Right Eval Left Eval  Hip flexion 5 5  Hip extension    Hip abduction    Hip adduction    Hip internal rotation    Hip external rotation    Knee flexion 5 5  Knee extension 5 5  Ankle dorsiflexion 5 5  Ankle plantarflexion    Ankle inversion    Ankle eversion    (Blank rows = not tested)  BED MOBILITY:  Independent  TRANSFERS: Assistive device utilized: Environmental consultant - 2 wheeled  Sit to stand: CGA Stand to sit: CGA Chair to chair: CGA Floor:  unsafe to attempt at this time  GAIT: Gait pattern: decreased step length- Left, decreased hip/knee flexion- Left, decreased ankle dorsiflexion- Left, and poor foot clearance- Left Distance walked: various clinic distances Assistive device utilized: Environmental consultant - 2 wheeled Level of assistance: CGA Comments: needs cues for safe RW management  FUNCTIONAL TESTs:    [facility] PT Assessment - 10/21/21 1217       Coordination   Finger Nose Finger Test slight dysmetria LUE with nose to finger and finger chase    Heel Shin Test [facility] bilaterally      Ambulation/Gait   Gait velocity 32.8 ft over 19 sec = 1.73 ft/sec      Standardized Balance Assessment   Standardized Balance Assessment Timed Up and Go Test;Five Times Sit to Stand    Five times sit to stand comments  14 sec with  BUE support from arms of chair      Timed Up and Go Test   TUG Normal TUG    Normal  TUG (seconds) 19             TODAY'S TREATMENT:  PT evaluation   PATIENT EDUCATION: Education details: Eval findings, POC Person educated: Patient and Spouse ([name]) Education method: Explanation Education comprehension: verbalized understanding   HOME EXERCISE PROGRAM: To be established next session   GOALS: Goals reviewed with patient? Yes  SHORT TERM GOALS: Target date: 11/11/2021  Pt will improve gait velocity to at least 2.0 ft/sec for improved gait efficiency and performance at CGA level with LRAD. Baseline: 1.73 ft/sec (10/21/21) Goal status: INITIAL  2.  Pt will initiate HEP Baseline:  Goal status: INITIAL  3.  Pt will improve normal TUG to less than or equal to 15 seconds for improved functional mobility and decreased fall risk. Baseline: 19 sec (10/21/2021) Goal status: INITIAL  4.  Pt will perform Berg Balance Scale to assess fall risk and determine specific balance impairments Baseline:  Goal status: INITIAL   LONG TERM GOALS: Target date: 12/02/2021  Pt will improve gait velocity to at least 2.5 ft/sec for improved gait efficiency and performance at Supervision level with LRAD. Baseline: 1.73 ft/sec (10/21/21) Goal status: INITIAL  2.  Pt will be independent with balance HEP for improved strength, balance, transfers and gait. Baseline:  Goal status: INITIAL  3.  Pt will improve normal TUG to less than or equal to 10 seconds for improved functional mobility and decreased fall risk. Baseline: 19 sec (10/21/2021) Goal status: INITIAL  4.  Goal to be set for Berg once assessed Baseline:  Goal status: INITIAL   ASSESSMENT:  CLINICAL IMPRESSION: Patient is a 76 year old male referred to Neuro OPPT for left lower extremity weakness and generalized weakness.   [name] PMH is significant for: arthritis, trigeminal neuralgia, acute on chronic intracranial subdural  hematoma, skin fibroxanthoma, possible seizure disorder  The following deficits were present during the exam: impaired balance, impaired coordination, decreased independence with gait and functional mobility, and decreased gait speed. Based on patient's decreased gait speed of 1.73 ft/sec, and TUG score of 19, pt is an increased risk for falls. Pt would benefit from skilled PT to address these impairments and functional limitations to maximize functional mobility independence.    OBJECTIVE IMPAIRMENTS Abnormal gait, decreased balance, decreased coordination, and difficulty walking.   ACTIVITY LIMITATIONS carrying, lifting, bending, sitting, standing, squatting, stairs, transfers, and locomotion level  PARTICIPATION LIMITATIONS: driving, community activity, and church  PERSONAL FACTORS Age and 1-2 comorbidities:   arthritis, trigeminal neuralgia, acute on chronic intracranial subdural hematoma, skin fibroxanthoma, possible seizure disorder are also affecting patient's functional outcome.   REHAB POTENTIAL: Good  CLINICAL DECISION MAKING: Evolving/moderate complexity  EVALUATION COMPLEXITY: Moderate  PLAN: PT FREQUENCY: 2x/week  PT DURATION: 6 weeks  PLANNED INTERVENTIONS: Therapeutic exercises, Therapeutic activity, Neuromuscular re-education, Balance training, Gait training, Patient/Family education, Self Care, Joint mobilization, Stair training, Orthotic/Fit training, DME instructions, Cryotherapy, Moist heat, Taping, Manual therapy, and Re-evaluation  PLAN FOR NEXT SESSION: assess Berg, initiate HEP for gait/balance, ask for OT referral if not placed   Excell Seltzer, PT, DPT, CSRS 10/21/2021, 12:50 PM

## 2021-10-21 NOTE — Telephone Encounter (Signed)
Pts wife called asking about MyChart messages that was sent. I was able to have Angela Nevin speak with pts wife.

## 2021-10-21 NOTE — Progress Notes (Unsigned)
Subjective:    Patient ID: Hunter Chang, male    DOB: [birth year], 76 y.o.   MRN: [id number]     HPI Guhan is here for follow up of his chronic medical problems, including   7/8 - transient b/l leg weakness, fall.  Similar episode one week prior - went to ED.    7/13-7/18 - admitted for new onset seizures.  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  EEG - cortical dysfunction.  Concern for tumor site infection.  Was placed on kepra and steroids - steroids then stopped.  ID c/s - hold abx until cx obtained.  transferred to [facility] to his neurosurgeon.  The tumor has recurred and he will be having gamma knife radiation.   Has started PT.    2016- skin - to scalp to   8/10 - gamma knife radiation   Home thrusday just over one week ago  HA in right frontal lobe.    Had fever last week 100.4    Left leg heavy, dec sensation.  Strength good  Medications and allergies reviewed with patient and updated if appropriate.  Current Outpatient Medications on File Prior to Visit  Medication Sig Dispense Refill  . latanoprost (XALATAN) 0.005 % ophthalmic solution Place 1 drop into both eyes at bedtime.    . levETIRAcetam (KEPPRA) 500 MG tablet Take 1 tablet (500 mg total) by mouth 2 (two) times daily.    . magnesium gluconate (MAGONATE) 500 MG tablet Take 500 mg by mouth daily.    . Multiple Vitamins-Minerals (ONE-A-DAY MENS 50+ ADVANTAGE) TABS Take 1 tablet by mouth daily.    . Multiple Vitamins-Minerals (ZINC PO) Take 50 mg by mouth daily.    [name] Kitchen VITAMIN D PO Take 1 tablet by mouth daily.     No current facility-administered medications on file prior to visit.     Review of Systems  Constitutional:  Positive for chills (once at night) and fever (100.4 once).  HENT:  Negative for congestion, ear pain, sinus pain and sore throat.   Eyes:  Negative for visual disturbance.  Respiratory:  Negative for cough, shortness of breath and wheezing.   Gastrointestinal:  Positive for  constipation. Negative for abdominal pain, diarrhea and nausea.  Genitourinary:  Negative for difficulty urinating, dysuria, frequency and hematuria.  Skin:  Negative for rash.  Neurological:  Positive for headaches (transient - intermittent).       Objective:  There were no vitals filed for this visit. BP Readings from Last 3 Encounters:  10/12/21 (!) 170/88  09/30/21 (!) 144/84  09/29/21 136/70   Wt Readings from Last 3 Encounters:  10/11/21 171 lb 1.2 oz (77.6 kg)  09/30/21 175 lb 11.2 oz (79.7 kg)  09/29/21 176 lb (79.8 kg)   There is no height or weight on file to calculate BMI.    Physical Exam     Lab Results  Component Value Date   WBC 7.4 10/08/2021   HGB 12.7 (L) 10/08/2021   HCT 36.6 (L) 10/08/2021   PLT 283 10/08/2021   GLUCOSE 106 (H) 10/12/2021   CHOL 180 10/23/2020   TRIG 92.0 10/23/2020   HDL 44.80 10/23/2020   LDLCALC 117 (H) 10/23/2020   ALT 30 10/08/2021   AST 20 10/08/2021   NA 139 10/12/2021   K 3.3 (L) 10/12/2021   CL 101 10/12/2021   CREATININE 0.88 10/12/2021   BUN 18 10/12/2021   CO2 26 10/12/2021   TSH 0.52 09/29/2021  INR 1.0 10/07/2021     Assessment & Plan:    See Problem List for Assessment and Plan of chronic medical problems.

## 2021-10-22 ENCOUNTER — Encounter: Payer: Self-pay | Admitting: Internal Medicine

## 2021-10-22 ENCOUNTER — Ambulatory Visit (INDEPENDENT_AMBULATORY_CARE_PROVIDER_SITE_OTHER): Payer: PPO | Admitting: Internal Medicine

## 2021-10-22 ENCOUNTER — Ambulatory Visit (INDEPENDENT_AMBULATORY_CARE_PROVIDER_SITE_OTHER)
Admission: RE | Admit: 2021-10-22 | Discharge: 2021-10-22 | Disposition: A | Discharge status: Home / Self Care | Payer: PPO | Source: Ambulatory Visit | Attending: Internal Medicine | Admitting: Internal Medicine

## 2021-10-22 VITALS — BP 116/74 | HR 102 | Temp 97.8°F | Ht 68.0 in | Wt 168.0 lb

## 2021-10-22 DIAGNOSIS — H409 Unspecified glaucoma: Secondary | ICD-10-CM

## 2021-10-22 DIAGNOSIS — R509 Fever, unspecified: Secondary | ICD-10-CM

## 2021-10-22 DIAGNOSIS — J9811 Atelectasis: Secondary | ICD-10-CM | POA: Diagnosis not present

## 2021-10-22 DIAGNOSIS — R531 Weakness: Secondary | ICD-10-CM | POA: Diagnosis not present

## 2021-10-22 DIAGNOSIS — R569 Unspecified convulsions: Secondary | ICD-10-CM

## 2021-10-22 LAB — COMPREHENSIVE METABOLIC PANEL
ALT: 31 U/L (ref 0–53)
AST: 19 U/L (ref 0–37)
Albumin: 3.9 g/dL (ref 3.5–5.2)
Alkaline Phosphatase: 158 U/L — ABNORMAL HIGH (ref 39–117)
BUN: 12 mg/dL (ref 6–23)
CO2: 31 mEq/L (ref 19–32)
Calcium: 9.6 mg/dL (ref 8.4–10.5)
Chloride: 100 mEq/L (ref 96–112)
Creatinine, Ser: 0.92 mg/dL (ref 0.40–1.50)
GFR: 81.49 mL/min (ref >60.00)
Glucose, Bld: 89 mg/dL (ref 70–99)
Potassium: 3.9 mEq/L (ref 3.5–5.1)
Sodium: 137 mEq/L (ref 135–145)
Total Bilirubin: 0.4 mg/dL (ref 0.2–1.2)
Total Protein: 7.5 g/dL (ref 6.0–8.3)

## 2021-10-22 LAB — URINALYSIS, ROUTINE W REFLEX MICROSCOPIC
Bilirubin Urine: NEGATIVE
Hgb urine dipstick: NEGATIVE
Ketones, ur: NEGATIVE
Leukocytes,Ua: NEGATIVE
Nitrite: NEGATIVE
Specific Gravity, Urine: 1.02 (ref 1.000–1.030)
Total Protein, Urine: NEGATIVE
Urine Glucose: NEGATIVE
Urobilinogen, UA: 1 (ref 0.0–1.0)
pH: 5.5 (ref 5.0–8.0)

## 2021-10-22 LAB — CBC WITH DIFFERENTIAL/PLATELET
Basophils Absolute: 0.1 10*3/uL (ref 0.0–0.1)
Basophils Relative: 0.9 % (ref 0.0–3.0)
Eosinophils Absolute: 0.2 10*3/uL (ref 0.0–0.7)
Eosinophils Relative: 2 % (ref 0.0–5.0)
HCT: 42.8 % (ref 39.0–52.0)
Hemoglobin: 14.4 g/dL (ref 13.0–17.0)
Lymphocytes Relative: 15 % (ref 12.0–46.0)
Lymphs Abs: 1.5 10*3/uL (ref 0.7–4.0)
MCHC: 33.7 g/dL (ref 30.0–36.0)
MCV: 89.4 fl (ref 78.0–100.0)
Monocytes Absolute: 0.9 10*3/uL (ref 0.1–1.0)
Monocytes Relative: 9.7 % (ref 3.0–12.0)
Neutro Abs: 7 10*3/uL (ref 1.4–7.7)
Neutrophils Relative %: 72.4 % (ref 43.0–77.0)
Platelets: 315 10*3/uL (ref 150.0–400.0)
RBC: 4.78 Mil/uL (ref 4.22–5.81)
RDW: 13.4 % (ref 11.5–15.5)
WBC: 9.7 10*3/uL (ref 4.0–10.5)

## 2021-10-22 NOTE — Patient Instructions (Signed)
     Blood work was ordered.  Have chest xray today.    Medications changes include :   none

## 2021-10-23 DIAGNOSIS — H409 Unspecified glaucoma: Secondary | ICD-10-CM | POA: Insufficient documentation

## 2021-10-23 DIAGNOSIS — R509 Fever, unspecified: Secondary | ICD-10-CM | POA: Insufficient documentation

## 2021-10-23 NOTE — Assessment & Plan Note (Addendum)
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

## 2021-10-23 NOTE — Assessment & Plan Note (Signed)
Chronic Stable Following with ophthalmology

## 2021-10-23 NOTE — Assessment & Plan Note (Addendum)
New On Keppra 500 mg twice daily Related to residual fibroxanthoma tumor Following with neurosurgery at [facility] To have gamma knife radiation 8/10

## 2021-10-23 NOTE — Assessment & Plan Note (Signed)
Acute Episode of a fever up to 100.4 and 1 episode of chills Given his current situation they are very concerned about the possibility of infection Review of systems negative for infection As a precaution we will check chest x-ray, UA, CBC, CMP We will continue to monitor for episodes of fever/chills

## 2021-10-25 DIAGNOSIS — Z79899 Other long term (current) drug therapy: Secondary | ICD-10-CM | POA: Diagnosis not present

## 2021-10-25 DIAGNOSIS — Z923 Personal history of irradiation: Secondary | ICD-10-CM | POA: Diagnosis not present

## 2021-10-25 DIAGNOSIS — D485 Neoplasm of uncertain behavior of skin: Secondary | ICD-10-CM | POA: Diagnosis not present

## 2021-10-26 ENCOUNTER — Ambulatory Visit: Payer: PPO | Attending: Physician Assistant | Admitting: Physical Therapy

## 2021-10-26 ENCOUNTER — Telehealth: Payer: Self-pay | Admitting: Physical Therapy

## 2021-10-26 DIAGNOSIS — R2689 Other abnormalities of gait and mobility: Secondary | ICD-10-CM | POA: Diagnosis not present

## 2021-10-26 DIAGNOSIS — M6281 Muscle weakness (generalized): Secondary | ICD-10-CM | POA: Insufficient documentation

## 2021-10-26 NOTE — Therapy (Signed)
OUTPATIENT PHYSICAL THERAPY NEURO TREATMENT   Patient Name: Hunter Chang MRN: [id number] DOB:07/02/1945, 76 y.o., male Today's Date: 10/26/2021   PCP: Binnie Rail, MD REFERRING PROVIDER: Nicanor Bake, PA-C    PT End of Session - 10/26/21 1323     Visit Number 2    Number of Visits 13   plus eval   Date for PT Re-Evaluation 12/02/21    Authorization Type [employer]    PT Start Time 1322    PT Stop Time 1400    PT Time Calculation (min) 38 min    Equipment Utilized During Treatment Gait belt    Activity Tolerance Patient tolerated treatment well    Behavior During Therapy WFL for tasks assessed/performed              Past Medical History:  Diagnosis Date   Arthritis    Chicken pox    [place] measles    Trigeminal neuralgia    Past Surgical History:  Procedure Laterality Date   HERNIA REPAIR     3 times   Patient Active Problem List   Diagnosis Date Noted   Glaucoma 10/23/2021   Fever 10/23/2021   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[place]) 10/08/2021   Acute encephalopathy 10/08/2021   Left-sided weakness 10/08/2021   Seizures ([place]) 10/07/2021   Transient weakness of left lower extremity 09/29/2021   Fatigue 09/29/2021   Decreased libido 09/29/2021   Erectile dysfunction 04/28/2021   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 81 for Muscle weakness (generalized)  THERAPY DIAG:  Muscle weakness (generalized)  Other abnormalities of gait and mobility  Rationale for Evaluation and Treatment Rehabilitation  SUBJECTIVE:                                                                                                                                                                                              SUBJECTIVE STATEMENT: Pt and his wife report he has had no more "episodes" of seizure activity or L  UE/LE weakness or "giving out". Pt just started steroids and has increased Keppra. Pt enters clinic with rollator (unsafe) but reports it is difficult for him to use his RW across pavement and uneven ground. Pt reports he has had no falls since last visit and is not having any pain.  Pt accompanied by: self and significant other, [name]  PERTINENT HISTORY: PMH: arthritis, trigeminal neuralgia, acute on chronic intracranial subdural hematoma, skin fibroxanthoma, possible seizure disorder   PAIN:  Are you having pain? No, not currently  Pt does report he occasionally has pain above his R eye, unsure if related to trigeminal neuralgia or tumor.  PATIENT GOALS strengthen LLE and LUE, to be able to hold something with LUE, return to playing tennis and active lifestyle  OBJECTIVE:   TODAY'S TREATMENT:    [facility] PT Assessment - 10/26/21 1328       Standardized Balance Assessment   Standardized Balance Assessment Berg Balance Test      Berg Balance Test   Sit to Stand Able to stand without using hands and stabilize independently    Standing Unsupported Able to stand 2 minutes with supervision    Sitting with Back Unsupported but Feet Supported on Floor or Stool Able to sit safely and securely 2 minutes    Stand to Sit Sits safely with minimal use of hands    Transfers Able to transfer safely, minor use of hands    Standing Unsupported with Eyes Closed Able to stand 10 seconds with supervision    Standing Unsupported with Feet Together Able to place feet together independently and stand for 1 minute with supervision    From Standing, Reach Forward with Outstretched Arm Reaches forward but needs supervision    From Standing Position, Pick up Object from Floor Unable to try/needs assist to keep balance    From Standing Position, Turn to Look Behind Over each Shoulder Needs supervision when turning    Turn 360 Degrees Needs assistance while turning    Standing Unsupported, Alternately Place  Feet on Step/Stool Needs assistance to keep from falling or unable to try    Standing Unsupported, One Foot in Front Able to take small step independently and hold 30 seconds    Standing on One Leg Tries to lift leg/unable to hold 3 seconds but remains standing independently    Total Score 30    Berg comment: 30/56, high fall risk            THER ACT: Performed Berg with patient, see above. Discussed score and functional implications with patient and his wife. Patient demonstrates increased fall risk as noted by score of  30/56 on Berg Balance Scale.  (<36= high risk for falls, close to 100%; 37-45 significant >80%; 46-51 moderate >50%; 52-55 lower >25%).  Pt enters clinic ambulating with rollator this date but exhibits unsafe behaviors with use of rollator. Educated pt on safe brake management and safe hand placement during transfers. Pt does require ongoing cues for safety throughout session, pt's wife able to provide appropriate verbal cueing to patient during session. Pt and wife verbalize understanding of safe rollator brake management.  Initiated balance HEP with patient, see bolded exercises below   PATIENT EDUCATION: Education details: safe rollator use and brake management, initiated HEP, BBS score and functional implications Person educated: Patient and Spouse ([name]) Education method: Theatre stage manager Education comprehension: verbalized understanding   HOME EXERCISE PROGRAM: Access Code: [email or web address] URL: [email or web address] Date: 10/26/2021 Prepared by: Excell Seltzer  Exercises - Alternating Step Taps with Counter Support  - 1 x daily - 7 x weekly - 3 sets - 10 reps - Side Stepping with Counter Support  - 1 x daily - 7 x weekly - 3 sets - 10 reps - Backward Walking with Counter Support  - 1 x daily - 7 x weekly - 3 sets - 10 reps   GOALS: Goals reviewed with patient? Yes  SHORT TERM GOALS: Target date: 11/11/2021  Pt will  improve gait  velocity to at least 2.0 ft/sec for improved gait efficiency and performance at CGA level with LRAD. Baseline: 1.73 ft/sec (10/21/21) Goal status: INITIAL  2.  Pt will initiate HEP Baseline:  Goal status: INITIAL  3.  Pt will improve normal TUG to less than or equal to 15 seconds for improved functional mobility and decreased fall risk. Baseline: 19 sec (10/21/2021) Goal status: INITIAL  4.  Pt will perform Berg Balance Scale to assess fall risk and determine specific balance impairments Baseline:  Goal status: INITIAL   LONG TERM GOALS: Target date: 12/02/2021  Pt will improve gait velocity to at least 2.5 ft/sec for improved gait efficiency and performance at Supervision level with LRAD. Baseline: 1.73 ft/sec (10/21/21) Goal status: INITIAL  2.  Pt will be independent with balance HEP for improved strength, balance, transfers and gait. Baseline:  Goal status: INITIAL  3.  Pt will improve normal TUG to less than or equal to 10 seconds for improved functional mobility and decreased fall risk. Baseline: 19 sec (10/21/2021) Goal status: INITIAL  4.  Goal to be set for Berg once assessed Baseline:  Goal status: INITIAL   ASSESSMENT:  CLINICAL IMPRESSION: Emphasis of skilled PT session on assessing balance with BBS, initiating balance HEP, and educating pt and wife on safe use of rollator including how to safely transfer with rollator and safe brake management. Pt scores 30/56 on BBS, indicating a high fall risk. Educated pt and his wife on score and functional implications, they verbalize understanding. Initiated balance HEP with patient and handout provided, see bolded exercises above.Pt will continue to benefit from skilled therapy services to address safety, balance, and strength impairments. Continue POC.   OBJECTIVE IMPAIRMENTS Abnormal gait, decreased balance, decreased coordination, and difficulty walking.   ACTIVITY LIMITATIONS carrying, lifting, bending, sitting, standing,  squatting, stairs, transfers, and locomotion level  PARTICIPATION LIMITATIONS: driving, community activity, and church  PERSONAL FACTORS Age and 1-2 comorbidities:   arthritis, trigeminal neuralgia, acute on chronic intracranial subdural hematoma, skin fibroxanthoma, possible seizure disorder are also affecting patient's functional outcome.   REHAB POTENTIAL: Good  CLINICAL DECISION MAKING: Evolving/moderate complexity  EVALUATION COMPLEXITY: Moderate  PLAN: PT FREQUENCY: 2x/week  PT DURATION: 6 weeks  PLANNED INTERVENTIONS: Therapeutic exercises, Therapeutic activity, Neuromuscular re-education, Balance training, Gait training, Patient/Family education, Self Care, Joint mobilization, Stair training, Orthotic/Fit training, DME instructions, Cryotherapy, Moist heat, Taping, Manual therapy, and Re-evaluation  PLAN FOR NEXT SESSION: add resistance to HEP if needed, add to HEP for balance and gait, work on static and dynamic standing balance, ask for OT referral if not placed    Excell Seltzer, PT, DPT, CSRS 10/26/2021, 2:01 PM

## 2021-10-28 ENCOUNTER — Ambulatory Visit: Payer: PPO | Admitting: Physical Therapy

## 2021-10-28 ENCOUNTER — Encounter: Payer: PPO | Admitting: Internal Medicine

## 2021-10-28 DIAGNOSIS — M6281 Muscle weakness (generalized): Secondary | ICD-10-CM

## 2021-10-28 DIAGNOSIS — R2689 Other abnormalities of gait and mobility: Secondary | ICD-10-CM

## 2021-10-28 NOTE — Therapy (Signed)
OUTPATIENT PHYSICAL THERAPY NEURO TREATMENT   Patient Name: Hunter Chang MRN: [id number] DOB:01-12-1946, 76 y.o., male Today's Date: 10/28/2021   PCP: Binnie Rail, MD REFERRING PROVIDER: Nicanor Bake, PA-C    PT End of Session - 10/28/21 1106     Visit Number 3    Number of Visits 13   plus eval   Date for PT Re-Evaluation 12/02/21    Authorization Type [employer]    PT Start Time 1105    PT Stop Time 1143    PT Time Calculation (min) 38 min    Equipment Utilized During Treatment Gait belt    Activity Tolerance Patient tolerated treatment well    Behavior During Therapy WFL for tasks assessed/performed               Past Medical History:  Diagnosis Date   Arthritis    Chicken pox    [place] measles    Trigeminal neuralgia    Past Surgical History:  Procedure Laterality Date   HERNIA REPAIR     3 times   Patient Active Problem List   Diagnosis Date Noted   Glaucoma 10/23/2021   Fever 10/23/2021   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 81 for Muscle weakness (generalized)  THERAPY DIAG:  Muscle weakness (generalized)  Other abnormalities of gait and mobility  Rationale for Evaluation and Treatment Rehabilitation  SUBJECTIVE:                                                                                                                                                                                              SUBJECTIVE STATEMENT: Pt reports no falls since last visit, no pain. Pt reports steroids are  helping with swelling in the brain and he feels better overall and that his balance has improved. Pt enters the clinic with RW rather than rollator this date and exhibits improved safety with this device. [name] wife reports they spoke with the doctor about an OT referral for LUE strengthening and coordination.  Pt accompanied by: self and significant other, [name]  PERTINENT HISTORY: PMH: arthritis, trigeminal neuralgia, acute on chronic intracranial subdural hematoma, skin fibroxanthoma, possible seizure disorder   PAIN:  Are you having pain? No, not currently  Pt does report he occasionally has pain above his R eye, unsure if related to trigeminal neuralgia or tumor.  PATIENT GOALS:  strengthen LLE and LUE, to be able to hold something with LUE, return to playing tennis and active lifestyle  OBJECTIVE:   TODAY'S TREATMENT:   THER ACT: Sidesteps with red theraband at counter top, x 3 laps. Pt needs cues for upright posture and to widen step length.  Monster walk with red theraband at counter top, x 3 laps.  Static standing balance on airex with RW and CGA: -normal stance x 30 sec, x 2 reps -Romberg stance x 30 sec, x 2 reps -L/R modified tandem x 30 sec each, x 2 reps  Pt exhibits use of ankle and hip strategy to recover static standing balance, requires most assist with L modified tandem stance.  SLS with minimal UE support on RW, CGA for balance -5 x 30 sec each with cues to try and decrease UE support on RW  Added to HEP, see bold exercises below   PATIENT EDUCATION: Education details: added to balance and LE strengthening HEP Person educated: Patient and Spouse ([name]) Education method: Theatre stage manager Education comprehension: verbalized understanding   HOME EXERCISE PROGRAM: Access Code: [email or web address] URL: [email or web address] Date: 10/26/2021 Prepared by: Excell Seltzer  Exercises - Alternating Step Taps with Counter Support  - 1 x daily  - 7 x weekly - 3 sets - 10 reps - Backward Walking with Counter Support  - 1 x daily - 7 x weekly - 3 sets - 10 reps - Side Stepping with Resistance at Ankles and Counter Support  - 1 x daily - 7 x weekly - 3 sets - 10 reps - Forward Monster Walk with Resistance at Ankles and Counter Support  - 1 x daily - 7 x weekly - 3 sets - 10 reps - Standing Single Leg Stance with Counter Support  - 1 x daily - 7 x weekly - 1 sets - 5 reps - 30 hold   GOALS: Goals reviewed with patient? Yes  SHORT TERM GOALS: Target date: 11/11/2021  Pt will improve gait velocity to at least 2.0 ft/sec for improved gait efficiency and performance at CGA level with LRAD. Baseline: 1.73 ft/sec (10/21/21) Goal status: INITIAL  2.  Pt will initiate HEP Baseline:  Goal status: INITIAL  3.  Pt will improve normal TUG to less than or equal to 15 seconds for improved functional mobility and decreased fall risk. Baseline: 19 sec (10/21/2021) Goal status: INITIAL  4.  Pt will perform Berg Balance Scale to assess fall risk and determine specific balance impairments Baseline:  Goal status: INITIAL   LONG TERM GOALS: Target date: 12/02/2021  Pt will improve gait velocity to at least 2.5 ft/sec for improved gait efficiency and performance at Supervision level with LRAD. Baseline: 1.73 ft/sec (10/21/21) Goal status: INITIAL  2.  Pt will be independent with balance HEP for improved strength, balance, transfers and gait. Baseline:  Goal status: INITIAL  3.  Pt will improve normal TUG to less than or equal to 10 seconds for improved functional mobility and decreased fall risk. Baseline: 19 sec (10/21/2021) Goal status: INITIAL  4.  Goal to be set for Berg once assessed Baseline:  Goal status: INITIAL   ASSESSMENT:  CLINICAL IMPRESSION: Emphasis of skilled PT session of assessing pt's current HEP and creating additional exercises for pt to safely perform at home. Added resistance to side-steps  that pt was already  performing, added monster walks, and added in SLS balance exercise. Pt continues to require CGA to min A for challenging balance activities. Pt will continue to benefit from skilled therapy services to address safety, balance, and strength impairments. Continue POC.   OBJECTIVE IMPAIRMENTS Abnormal gait, decreased balance, decreased coordination, and difficulty walking.   ACTIVITY LIMITATIONS carrying, lifting, bending, sitting, standing, squatting, stairs, transfers, and locomotion level  PARTICIPATION LIMITATIONS: driving, community activity, and church  PERSONAL FACTORS Age and 1-2 comorbidities:   arthritis, trigeminal neuralgia, acute on chronic intracranial subdural hematoma, skin fibroxanthoma, possible seizure disorder are also affecting patient's functional outcome.   REHAB POTENTIAL: Good  CLINICAL DECISION MAKING: Evolving/moderate complexity  EVALUATION COMPLEXITY: Moderate  PLAN: PT FREQUENCY: 2x/week  PT DURATION: 6 weeks  PLANNED INTERVENTIONS: Therapeutic exercises, Therapeutic activity, Neuromuscular re-education, Balance training, Gait training, Patient/Family education, Self Care, Joint mobilization, Stair training, Orthotic/Fit training, DME instructions, Cryotherapy, Moist heat, Taping, Manual therapy, and Re-evaluation  PLAN FOR NEXT SESSION:  add to HEP for balance and strengthening, work on static and dynamic standing balance, obstacle course? uneven surfaces with RW?, trial gait with decreased AD reliance     Excell Seltzer, PT, DPT, CSRS 10/28/2021, 11:43 AM

## 2021-11-01 ENCOUNTER — Encounter: Payer: Self-pay | Admitting: Physical Therapy

## 2021-11-01 ENCOUNTER — Ambulatory Visit: Payer: PPO | Admitting: Physical Therapy

## 2021-11-01 DIAGNOSIS — R2689 Other abnormalities of gait and mobility: Secondary | ICD-10-CM

## 2021-11-01 DIAGNOSIS — M6281 Muscle weakness (generalized): Secondary | ICD-10-CM | POA: Diagnosis not present

## 2021-11-01 NOTE — Therapy (Signed)
OUTPATIENT PHYSICAL THERAPY NEURO TREATMENT   Patient Name: Hunter Chang MRN: [id number] DOB:Oct 27, 1945, 76 y.o., male Today's Date: 11/01/2021   PCP: Binnie Rail, MD REFERRING PROVIDER: Nicanor Bake, PA-C    PT End of Session - 11/01/21 1318     Visit Number 4    Number of Visits 13   plus eval   Date for PT Re-Evaluation 12/02/21    Authorization Type [employer]    PT Start Time 1317    PT Stop Time 1357    PT Time Calculation (min) 40 min    Equipment Utilized During Treatment Gait belt    Activity Tolerance Patient tolerated treatment well    Behavior During Therapy WFL for tasks assessed/performed                Past Medical History:  Diagnosis Date   Arthritis    Chicken pox    [place] measles    Trigeminal neuralgia    Past Surgical History:  Procedure Laterality Date   HERNIA REPAIR     3 times   Patient Active Problem List   Diagnosis Date Noted   Glaucoma 10/23/2021   Fever 10/23/2021   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 81 for Muscle weakness (generalized)  THERAPY DIAG:  Muscle weakness (generalized)  Other abnormalities of gait and mobility  Rationale for Evaluation and Treatment Rehabilitation  SUBJECTIVE:                                                                                                                                                                                              SUBJECTIVE STATEMENT: Pt reports no falls, no pain. Exercises are going well. Has a referral  for OT so wants to get that appointment scheduled.   Pt accompanied by: self and family member, daughter  PERTINENT HISTORY: PMH: arthritis, trigeminal neuralgia, acute on chronic intracranial subdural hematoma, skin fibroxanthoma, possible seizure disorder   PAIN:  Are you having pain? No, not currently  Pt does report he occasionally has pain above his R eye, unsure if related to trigeminal neuralgia or tumor.  PATIENT GOALS:  strengthen LLE and LUE, to be able to hold something with  LUE, return to playing tennis and active lifestyle  OBJECTIVE:   TODAY'S TREATMENT:   THER ACT: Obtacle course walking across floor mat, sidesteps with gumdrop taps with alt LE, and beam step-overs with no AD and min A for balance. Pt intially with LOB when stepping over beam with LLE and when performing gumdrop taps with LLE. With cues for decreased speed and increased focus on task balance improves.  NMR: Standing beam fwd/back step-overs with LLE 2 x 10 reps with min HHA Standing beam fwd/back step-overs with RLE, 2 x 10 reps with no UE support but min A for balance  Standing alt L/R gumdrop taps with cues for LE and color of gumdrop, min A for balance and cues for decreased speed  Static standing balance with min A and no UE support performing rebounder ball toss with 2 kg weighted ball -normal stance x 15 reps -Romberg stance x 15 reps -normal stance on airex x 15 reps -Romberg stance on airex x 15 reps -modified tandem L/R stance on airex x 15 reps Pt only has some LOB with modified tandem stance on airex, able to correct with min A.   GAIT: Gait pattern: step through pattern, decreased step length- Left, decreased hip/knee flexion- Left, and poor foot clearance- Left Distance walked: 345 ft Assistive device utilized: None Level of assistance: CGA Comments: trial gait with no AD, cues to increase speed, initially timid but improved mechanics with increase in speed, onset of L foot catch  with onset of fatigue  Pt instructed to continue working on walking short distances in the home with no AD with family assisting him.   PATIENT EDUCATION: Education details: continue HEP, walking program in the home to work on gait with no AD Person educated: Patient and Spouse ([name]) Education method: Theatre stage manager Education comprehension: verbalized understanding   HOME EXERCISE PROGRAM: Access Code: [email or web address] URL: [email or web address] Date: 10/26/2021 Prepared by: Excell Seltzer  Exercises - Alternating Step Taps with Counter Support  - 1 x daily - 7 x weekly - 3 sets - 10 reps - Backward Walking with Counter Support  - 1 x daily - 7 x weekly - 3 sets - 10 reps - Side Stepping with Resistance at Ankles and Counter Support  - 1 x daily - 7 x weekly - 3 sets - 10 reps - Forward Monster Walk with Resistance at Ankles and Counter Support  - 1 x daily - 7 x weekly - 3 sets - 10 reps - Standing Single Leg Stance with Counter Support  - 1 x daily - 7 x weekly - 1 sets - 5 reps - 30 hold  Walking program: walk short distances in the home with no device and with a family member with you at all times holding onto a gait belt. Make sure you have multiple places to sit setup around the house so you can take breaks as needed.   GOALS: Goals reviewed with patient? Yes  SHORT TERM GOALS: Target date: 11/11/2021  Pt will improve gait velocity to at least 2.0 ft/sec for improved gait efficiency and performance at CGA level with LRAD. Baseline: 1.73 ft/sec (10/21/21) Goal status: INITIAL  2.  Pt will initiate HEP Baseline: established 10/26/21 Goal status: MET  3.  Pt will improve normal TUG to less than or equal to 15 seconds for improved functional mobility and decreased fall risk. Baseline: 19 sec (10/21/2021) Goal status: INITIAL  4.  Pt will perform Berg Balance Scale to assess fall  risk and determine specific balance impairments Baseline: 30/56 on  8/1 Goal status: MET   LONG TERM GOALS: Target date: 12/02/2021  Pt will improve gait velocity to at least 2.5 ft/sec for improved gait efficiency and performance at Supervision level with LRAD. Baseline: 1.73 ft/sec (10/21/21) Goal status: INITIAL  2.  Pt will be independent with balance HEP for improved strength, balance, transfers and gait. Baseline:  Goal status: INITIAL  3.  Pt will improve normal TUG to less than or equal to 10 seconds for improved functional mobility and decreased fall risk. Baseline: 19 sec (10/21/2021) Goal status: INITIAL  4.  Pt will improve Berg Balance Score to 37/56 to demonstrate decreased fall risk Baseline: 30/56 (8/1) Goal status: INITIAL   ASSESSMENT:  CLINICAL IMPRESSION: Emphasis of skilled PT session on assessing gait with no AD and continuing to work on balance and LLE NMR. Pt able to perform gait up to 345 ft with no AD this session with CGA for balance and cues for increased speed and attention to task. Pt given home walking program to continue to work on walking short distances in the home with decreased AD reliance. Pt also continues to exhibit decreased control of LLE during functional tasks and decreased balance/increased fall risk when not attending to LLE. Pt continues to benefit from skilled PT services to address balance, strength, and safety impairments. Continue POC.    OBJECTIVE IMPAIRMENTS Abnormal gait, decreased balance, decreased coordination, and difficulty walking.   ACTIVITY LIMITATIONS carrying, lifting, bending, sitting, standing, squatting, stairs, transfers, and locomotion level  PARTICIPATION LIMITATIONS: driving, community activity, and church  PERSONAL FACTORS Age and 1-2 comorbidities:   arthritis, trigeminal neuralgia, acute on chronic intracranial subdural hematoma, skin fibroxanthoma, possible seizure disorder are also affecting patient's functional outcome.   REHAB POTENTIAL: Good  CLINICAL DECISION MAKING:  Evolving/moderate complexity  EVALUATION COMPLEXITY: Moderate  PLAN: PT FREQUENCY: 2x/week  PT DURATION: 6 weeks  PLANNED INTERVENTIONS: Therapeutic exercises, Therapeutic activity, Neuromuscular re-education, Balance training, Gait training, Patient/Family education, Self Care, Joint mobilization, Stair training, Orthotic/Fit training, DME instructions, Cryotherapy, Moist heat, Taping, Manual therapy, and Re-evaluation  PLAN FOR NEXT SESSION:  add to HEP for balance and strengthening, work on static and dynamic standing balance, obstacle course, uneven surfaces, beam step-overs, resisted step-ups, gait with no AD    Excell Seltzer, PT, DPT, CSRS 11/01/2021, 8:33 PM

## 2021-11-03 ENCOUNTER — Encounter: Payer: Self-pay | Admitting: Physical Therapy

## 2021-11-03 ENCOUNTER — Ambulatory Visit: Payer: PPO | Admitting: Physical Therapy

## 2021-11-03 DIAGNOSIS — M6281 Muscle weakness (generalized): Secondary | ICD-10-CM

## 2021-11-03 DIAGNOSIS — R2689 Other abnormalities of gait and mobility: Secondary | ICD-10-CM

## 2021-11-03 NOTE — Therapy (Signed)
OUTPATIENT PHYSICAL THERAPY NEURO TREATMENT   Patient Name: Hunter Chang MRN: [id number] DOB:26-Dec-1945, 76 y.o., male Today's Date: 11/03/2021   PCP: Binnie Rail, MD REFERRING PROVIDER: Nicanor Bake, PA-C    PT End of Session - 11/03/21 1148     Visit Number 5    Number of Visits 13   plus eval   Date for PT Re-Evaluation 12/02/21    Authorization Type [employer]    PT Start Time 1147    PT Stop Time 1233    PT Time Calculation (min) 46 min    Equipment Utilized During Treatment Gait belt    Activity Tolerance Patient tolerated treatment well    Behavior During Therapy WFL for tasks assessed/performed                 Past Medical History:  Diagnosis Date   Arthritis    Chicken pox    [place] measles    Trigeminal neuralgia    Past Surgical History:  Procedure Laterality Date   HERNIA REPAIR     3 times   Patient Active Problem List   Diagnosis Date Noted   Glaucoma 10/23/2021   Fever 10/23/2021   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 81 for Muscle weakness (generalized)  THERAPY DIAG:  Muscle weakness (generalized)  Other abnormalities of gait and mobility  Rationale for Evaluation and Treatment Rehabilitation  SUBJECTIVE:                                                                                                                                                                                             SUBJECTIVE STATEMENT: Pt is anxious about his gamma knife procedure tomorrow. Pt has been able  to walk short distances without the RW with family assist,  has been walking to the mailbox without the RW.   Pt accompanied by: self and family member, daughter Erasmo Downer  PERTINENT HISTORY: PMH: arthritis, trigeminal neuralgia, acute on chronic intracranial subdural hematoma, skin fibroxanthoma, possible seizure disorder   PAIN:  Are you having pain? No, not currently  Pt does report he occasionally has pain above his R eye, unsure if related to trigeminal neuralgia or tumor.  PATIENT GOALS:  strengthen LLE and LUE, to be able to hold something with LUE, return to playing tennis and active lifestyle  OBJECTIVE:   TODAY'S TREATMENT:   THER ACT: Obstacle course with CGA to min A and no AD: static stance on airex x 30 sec, stepping over 12" beam, stepping up/down 4" curb step, stepping over 4" hurdles leading with RLE then with LLE. Pt hits beam with RLE when stepping over with LLE on first round, no other instances of hitting obstacles after this one instance.  NMR: Resisted step-taps on 6" step with BUE supported, alt L/R with red theraband, 2 x 10 reps, 2 x 15 reps  Alt L/R gum drop taps with gait with no UE support and CGA for balance, 2 x 5 reps, able to retrieve gumdrops from the floor with min A for balance  Static stance on airex with CGA to min A performing ball toss x 15 reps, progression to 2# dowel rod volleyball x 15 reps. Pt has one LOB with min A needed to recover balance and prevent fall.  Sidesteps over hurdles with BUE support on therapist arms, cues to increase step length and clearance, 4 x 10 ft each direction.  Static stance on rocker board in // bars: -L/R board, with vertical and horizontal head turns x 10 each direction. Horizontal head turns difficulty greater than vertical head turns -fwd/back board, with horizontal and vertical head turns. Pt exhibits some difficulty with head turns in both directions.   GAIT: Gait pattern: step through pattern, decreased  step length- Left, decreased hip/knee flexion- Left, and poor foot clearance- Left Distance walked: 345 ft Assistive device utilized: None Level of assistance: CGA Comments: trial gait with no AD, pt exhibits improved speed with gait with no AD this date, pt does continue to exhibit decreased LLE clearance that increases with onset of fatigue, pt also exhibits decreased B arm swing (L>R), attempt to have pt add in arm swing but he exhibits some difficulty with this   PATIENT EDUCATION: Education details: continue HEP, walking program in the home to work on gait with no AD Person educated: Patient and Child(ren) (daughter Erasmo Downer) Education method: Explanation Education comprehension: verbalized understanding   HOME EXERCISE PROGRAM: Access Code: [email or web address] URL: [email or web address] Date: 10/26/2021 Prepared by: Excell Seltzer  Exercises - Alternating Step Taps with Counter Support  - 1 x daily - 7 x weekly - 3 sets - 10 reps - Backward Walking with Counter Support  - 1 x daily - 7 x weekly - 3 sets - 10 reps - Side Stepping with Resistance at Ankles and Counter Support  - 1 x daily - 7 x weekly - 3 sets - 10 reps - Forward Monster Walk with Resistance at Ankles and Counter Support  - 1 x daily - 7 x weekly - 3 sets - 10 reps - Standing Single Leg Stance with Counter Support  - 1 x daily - 7 x weekly - 1 sets - 5 reps - 30 hold  Walking program: walk short distances in the home with no device and with a family member with you at all times holding onto a gait belt. Make sure you have multiple places to sit setup around the house so you can take breaks as needed.   GOALS: Goals reviewed with patient? Yes  SHORT TERM GOALS: Target date: 11/11/2021  Pt will improve gait velocity to at least 2.0 ft/sec for improved gait efficiency and performance at CGA level with LRAD. Baseline: 1.73 ft/sec (10/21/21)  Goal status: INITIAL  2.  Pt will initiate HEP Baseline: established  10/26/21 Goal status: MET  3.  Pt will improve normal TUG to less than or equal to 15 seconds for improved functional mobility and decreased fall risk. Baseline: 19 sec (10/21/2021) Goal status: INITIAL  4.  Pt will perform Berg Balance Scale to assess fall risk and determine specific balance impairments Baseline: 30/56 on 8/1 Goal status: MET   LONG TERM GOALS: Target date: 12/02/2021  Pt will improve gait velocity to at least 2.5 ft/sec for improved gait efficiency and performance at Supervision level with LRAD. Baseline: 1.73 ft/sec (10/21/21) Goal status: INITIAL  2.  Pt will be independent with balance HEP for improved strength, balance, transfers and gait. Baseline:  Goal status: INITIAL  3.  Pt will improve normal TUG to less than or equal to 10 seconds for improved functional mobility and decreased fall risk. Baseline: 19 sec (10/21/2021) Goal status: INITIAL  4.  Pt will improve Berg Balance Score to 37/56 to demonstrate decreased fall risk Baseline: 30/56 (8/1) Goal status: INITIAL   ASSESSMENT:  CLINICAL IMPRESSION: Emphasis of skilled PT session on BLE NMR and balance training. Pt exhibits improved gait mechanics with no AD this date as well as increased speed. Pt does continue to exhibit decreased arm swing with gait and has difficulty initiating this. Pt continues to benefit from working on activities that challenge his balance including step-overs and working on unstable surfaces such as the airex and [employer] operational [employer]. Continue POC.   OBJECTIVE IMPAIRMENTS Abnormal gait, decreased balance, decreased coordination, and difficulty walking.   ACTIVITY LIMITATIONS carrying, lifting, bending, sitting, standing, squatting, stairs, transfers, and locomotion level  PARTICIPATION LIMITATIONS: driving, community activity, and church  PERSONAL FACTORS Age and 1-2 comorbidities:   arthritis, trigeminal neuralgia, acute on chronic intracranial subdural hematoma, skin fibroxanthoma,  possible seizure disorder are also affecting patient's functional outcome.   REHAB POTENTIAL: Good  CLINICAL DECISION MAKING: Evolving/moderate complexity  EVALUATION COMPLEXITY: Moderate  PLAN: PT FREQUENCY: 2x/week  PT DURATION: 6 weeks  PLANNED INTERVENTIONS: Therapeutic exercises, Therapeutic activity, Neuromuscular re-education, Balance training, Gait training, Patient/Family education, Self Care, Joint mobilization, Stair training, Orthotic/Fit training, DME instructions, Cryotherapy, Moist heat, Taping, Manual therapy, and Re-evaluation  PLAN FOR NEXT SESSION:  add to HEP for balance and strengthening, work on static and dynamic standing balance, obstacle course, uneven surfaces, beam step-overs, resisted step-ups, gait with no AD, 4" hurdles forwards and laterally    Excell Seltzer, PT, DPT, CSRS 11/03/2021, 12:58 PM

## 2021-11-04 DIAGNOSIS — C4449 Other specified malignant neoplasm of skin of scalp and neck: Secondary | ICD-10-CM | POA: Diagnosis not present

## 2021-11-04 DIAGNOSIS — C719 Malignant neoplasm of brain, unspecified: Secondary | ICD-10-CM | POA: Diagnosis not present

## 2021-11-04 DIAGNOSIS — C490 Malignant neoplasm of connective and soft tissue of head, face and neck: Secondary | ICD-10-CM | POA: Diagnosis not present

## 2021-11-04 DIAGNOSIS — Z510 Encounter for antineoplastic radiation therapy: Secondary | ICD-10-CM | POA: Diagnosis not present

## 2021-11-04 DIAGNOSIS — D496 Neoplasm of unspecified behavior of brain: Secondary | ICD-10-CM | POA: Diagnosis not present

## 2021-11-04 DIAGNOSIS — D485 Neoplasm of uncertain behavior of skin: Secondary | ICD-10-CM | POA: Diagnosis not present

## 2021-11-08 ENCOUNTER — Encounter: Payer: Self-pay | Admitting: Physical Therapy

## 2021-11-08 ENCOUNTER — Ambulatory Visit: Payer: PPO | Admitting: Physical Therapy

## 2021-11-08 DIAGNOSIS — M6281 Muscle weakness (generalized): Secondary | ICD-10-CM | POA: Diagnosis not present

## 2021-11-08 DIAGNOSIS — R2689 Other abnormalities of gait and mobility: Secondary | ICD-10-CM

## 2021-11-08 NOTE — Therapy (Signed)
OUTPATIENT PHYSICAL THERAPY NEURO TREATMENT   Patient Name: Hunter Chang MRN: [id number] DOB:02/21/1946, 76 y.o., male Today's Date: 11/08/2021   PCP: Binnie Rail, MD REFERRING PROVIDER: Nicanor Bake, PA-C    PT End of Session - 11/08/21 1103     Visit Number 6    Number of Visits 13   plus eval   Date for PT Re-Evaluation 12/02/21    Authorization Type [employer]    Progress Note Due on Visit 10    PT Start Time 1102    PT Stop Time 1140    PT Time Calculation (min) 38 min    Equipment Utilized During Treatment Gait belt    Activity Tolerance Patient tolerated treatment well    Behavior During Therapy WFL for tasks assessed/performed                  Past Medical History:  Diagnosis Date   Arthritis    Chicken pox    [place] measles    Trigeminal neuralgia    Past Surgical History:  Procedure Laterality Date   HERNIA REPAIR     3 times   Patient Active Problem List   Diagnosis Date Noted   Glaucoma 10/23/2021   Fever 10/23/2021   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 81 for Muscle weakness (generalized)  THERAPY DIAG:  Muscle weakness (generalized)  Other abnormalities of gait and mobility  Rationale for Evaluation and Treatment Rehabilitation  SUBJECTIVE:                                                                                                                                                                                             SUBJECTIVE STATEMENT: Pt reports his gamma knife procedure  went well though he was fatigued afterwards. No headaches. Is walking around the house without RW and is doing well. Easier to pick up his legs while walking.   Pt accompanied by: self and family member, daughter Hunter Chang  PERTINENT HISTORY: PMH: arthritis, trigeminal neuralgia, acute on chronic intracranial subdural hematoma, skin fibroxanthoma, possible seizure disorder   PAIN:  Are you having pain? No, not currently  Pt does report he occasionally has pain above his R eye,  unsure if related to trigeminal neuralgia or tumor.  PATIENT GOALS:  strengthen LLE and LUE, to be able to hold something with LUE, return to playing tennis and active lifestyle  OBJECTIVE:   TODAY'S TREATMENT:  THER ACT: Obstacle course with 4" hurdle step-overs and sidesteps with alt L/R cone taps with no AD and min A for balance  4" beam step-overs L/R forwards 2 x 10 reps, min A for balance, LLE catches 50% of the time 4" beam step-overs L/R laterally 2 x 10 reps, min A for balance  Sit to stand x 10 reps no UE onto airex  Added to HEP, see bolded below (progressed from step-taps to cone taps)   GAIT: Gait pattern: step through pattern, decreased arm swing- Right, decreased arm swing- Left, decreased step length- Left, and decreased hip/knee flexion- Left Distance walked: 230 ft Assistive device utilized: None Level of assistance: CGA Comments: Pt exhibits increased gait speed with no AD this date. Pt continues to exhibit decreased LLE control and decreased BUE arm swing during gait but overall exhibits increased speed and balance with gait with no AD.   [facility] PT Assessment - 11/08/21 1109       Ambulation/Gait   Gait velocity 32.8 ft over 9 sec = 3.64 ft/sec             PATIENT EDUCATION: Education details: continue HEP, walking program in the home to work on gait with no AD Person educated: Patient and Child(ren) (daughter Hunter Chang) Education method: Explanation Education comprehension:  verbalized understanding   HOME EXERCISE PROGRAM: Access Code: [email or web address] URL: [email or web address] Date: 10/26/2021 Prepared by: Excell Seltzer  Exercises - Alternating ConeTaps with Counter Support  - 1 x daily - 7 x weekly - 3 sets - 10 reps - Backward Walking with Counter Support  - 1 x daily - 7 x weekly - 3 sets - 10 reps - Side Stepping with Resistance at Ankles and Counter Support  - 1 x daily - 7 x weekly - 3 sets - 10 reps - Forward Monster Walk with Resistance at Ankles and Counter Support  - 1 x daily - 7 x weekly - 3 sets - 10 reps - Standing Single Leg Stance with Counter Support  - 1 x daily - 7 x weekly - 1 sets - 5 reps - 30 hold - Sit to Stand on Foam Pad  - 1 x daily - 7 x weekly - 3 sets - 10 reps - Walking Step Over  - 1 x daily - 7 x weekly - 3 sets - 10 reps  Walking program: walk short distances in the home with no device and with a family member with you at all times holding onto a gait belt. Make sure you have multiple places to sit set up around the house so you can take breaks as needed.   GOALS: Goals reviewed with patient? Yes  SHORT TERM GOALS: Target date: 11/11/2021  Pt will improve gait velocity to at least 2.0 ft/sec for improved gait efficiency and performance at CGA level with LRAD. Baseline: 1.73 ft/sec (10/21/21), 3.64 ft/sec (8/14) Goal status: MET  2.  Pt will initiate HEP Baseline: established 10/26/21 Goal status: MET  3.  Pt will improve normal TUG to less than or equal to 15 seconds for improved functional mobility and decreased fall risk. Baseline: 19 sec (10/21/2021) Goal status: INITIAL  4.  Pt will perform Berg Balance Scale to assess fall risk and determine specific balance impairments Baseline: 30/56  on 8/1 Goal status: MET   LONG TERM GOALS: Target date: 12/02/2021  Pt will improve gait velocity to at least 2.5 ft/sec for improved gait efficiency and performance at Supervision level with LRAD. Baseline: 1.73  ft/sec (10/21/21), 3.64 ft/sec (8/14) Goal status: INITIAL  2.  Pt will be independent with balance HEP for improved strength, balance, transfers and gait. Baseline:  Goal status: INITIAL  3.  Pt will improve normal TUG to less than or equal to 10 seconds for improved functional mobility and decreased fall risk. Baseline: 19 sec (10/21/2021) Goal status: INITIAL  4.  Pt will improve Berg Balance Score to 37/56 to demonstrate decreased fall risk Baseline: 30/56 (8/1) Goal status: INITIAL   ASSESSMENT:  CLINICAL IMPRESSION: Emphasis of skilled PT session on continuing to assess gait with LRAD and adding to HEP for balance. Pt exhibits improved gait speed without AD this date (3.64 ft/sec as compared to 1.73 ft/sec with RW on initial eval) as well as improved mechanics and control of LLE during gait. Pt continues to exhibit some difficulty with lifting LLE during functional tasks such as stepping over obstacles or getting in/our of shower so he continues to benefit from exercises targeting hip and knee flexion with stepping. Continue POC.   OBJECTIVE IMPAIRMENTS Abnormal gait, decreased balance, decreased coordination, and difficulty walking.   ACTIVITY LIMITATIONS carrying, lifting, bending, sitting, standing, squatting, stairs, transfers, and locomotion level  PARTICIPATION LIMITATIONS: driving, community activity, and church  PERSONAL FACTORS Age and 1-2 comorbidities:   arthritis, trigeminal neuralgia, acute on chronic intracranial subdural hematoma, skin fibroxanthoma, possible seizure disorder are also affecting patient's functional outcome.   REHAB POTENTIAL: Good  CLINICAL DECISION MAKING: Evolving/moderate complexity  EVALUATION COMPLEXITY: Moderate  PLAN: PT FREQUENCY: 2x/week  PT DURATION: 6 weeks  PLANNED INTERVENTIONS: Therapeutic exercises, Therapeutic activity, Neuromuscular re-education, Balance training, Gait training, Patient/Family education, Self Care, Joint  mobilization, Stair training, Orthotic/Fit training, DME instructions, Cryotherapy, Moist heat, Taping, Manual therapy, and Re-evaluation  PLAN FOR NEXT SESSION:  work on static and dynamic standing balance, obstacle course, uneven surfaces, beam step-overs, resisted step-ups, gait with no AD, 4" hurdles forwards and laterally, goal assess next session (TUG, BERG)    Excell Seltzer, PT, DPT, CSRS 11/08/2021, 11:41 AM

## 2021-11-09 DIAGNOSIS — D485 Neoplasm of uncertain behavior of skin: Secondary | ICD-10-CM | POA: Diagnosis not present

## 2021-11-10 ENCOUNTER — Encounter: Payer: Self-pay | Admitting: Physical Therapy

## 2021-11-10 ENCOUNTER — Ambulatory Visit: Payer: PPO | Admitting: Physical Therapy

## 2021-11-10 DIAGNOSIS — M6281 Muscle weakness (generalized): Secondary | ICD-10-CM | POA: Diagnosis not present

## 2021-11-10 DIAGNOSIS — R2689 Other abnormalities of gait and mobility: Secondary | ICD-10-CM

## 2021-11-10 NOTE — Therapy (Signed)
OUTPATIENT PHYSICAL THERAPY NEURO TREATMENT   Patient Name: Hunter Chang MRN: [id number] DOB:1946-01-06, 76 y.o., male Today's Date: 11/10/2021   PCP: Hunter Rail, MD REFERRING PROVIDER: Nicanor Bake, PA-C    PT End of Session - 11/10/21 1103     Visit Number 7    Number of Visits 13   plus eval   Date for PT Re-Evaluation 12/02/21    Authorization Type [employer]    Progress Note Due on Visit 10    PT Start Time 1102    PT Stop Time 1142    PT Time Calculation (min) 40 min    Equipment Utilized During Treatment Gait belt    Activity Tolerance Patient tolerated treatment well    Behavior During Therapy WFL for tasks assessed/performed                   Past Medical History:  Diagnosis Date   Arthritis    Chicken pox    [place] measles    Trigeminal neuralgia    Past Surgical History:  Procedure Laterality Date   HERNIA REPAIR     3 times   Patient Active Problem List   Diagnosis Date Noted   Glaucoma 10/23/2021   Fever 10/23/2021   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 81 for Muscle weakness (generalized)  THERAPY DIAG:  Muscle weakness (generalized)  Other abnormalities of gait and mobility  Rationale for Evaluation and Treatment Rehabilitation  SUBJECTIVE:                                                                                                                                                                                             SUBJECTIVE STATEMENT: Pt reports he is feeling more  fatigued today from lots of doctor's appointments yesterday and lots of walking involved getting to/from appointments. Per pt's wife pt was instructed that he needs to rest x 8 weeks after gamma knife procedure and it make take 8 weeks to see effects of procedure. She also reports that pt is gradually being weaned off steroids. Pt more at risk for falls during this time period. [name] wife also reports pt occasionally walking around his house without RW carrying  coffee, etc.   Pt accompanied by: self and significant other, wife Hunter Chang  PERTINENT HISTORY: PMH: arthritis, trigeminal neuralgia, acute on chronic intracranial subdural hematoma, skin fibroxanthoma, possible seizure disorder   PAIN:  Are you having pain? No, not currently  Pt does report he occasionally has pain above his R eye, unsure if related to trigeminal neuralgia or tumor.  PATIENT GOALS:  strengthen LLE and LUE, to be able to hold something with LUE, return to playing tennis and active lifestyle  OBJECTIVE:   TODAY'S TREATMENT:  THER ACT:   [facility] PT Assessment - 11/10/21 1111       Standardized Balance Assessment   Standardized Balance Assessment Berg Balance Test;Timed Up and Go Test      Berg Balance Test   Sit to Stand Able to stand without using hands and stabilize independently    Standing Unsupported Able to stand 2 minutes with supervision    Sitting with Back Unsupported but Feet Supported on Floor or Stool Able to sit safely and securely 2 minutes    Stand to Sit Sits safely with minimal use of hands    Transfers Able to transfer safely, minor use of hands    Standing Unsupported with Eyes Closed Able to stand 10 seconds with supervision    Standing Unsupported with Feet Together Able to place feet together independently and stand for 1 minute with supervision    From Standing, Reach Forward with Outstretched Arm Can reach forward >5 cm safely (2")    From Standing Position, Pick up Object from Floor Able to  pick up shoe, needs supervision    From Standing Position, Turn to Look Behind Over each Shoulder Looks behind from both sides and weight shifts well    Turn 360 Degrees Able to turn 360 degrees safely but slowly    Standing Unsupported, Alternately Place Feet on Step/Stool Able to complete >2 steps/needs minimal assist    Standing Unsupported, One Foot in Front Able to plae foot ahead of the other independently and hold 30 seconds    Standing on One Leg Able to lift leg independently and hold equal to or more than 3 seconds    Total Score 42    Berg comment: 42/56, significant fall risk      Timed Up and Go Test   TUG Normal TUG    Normal TUG (seconds) 12.97   with RW; 9.38 no RW and close S*            Navigation of 6" hurdles forwards with alt LE leading, CGA and no UE support Navigation of 6" hurdles laterally, CGA and no UE support   GAIT: Gait with no AD x 115 ft with static hold of surge of 15 lbs, CGA Gait x 230 ft with bicep curls of surge of 15 lbs, CGA  Gait pattern: decreased arm swing- Right, decreased arm swing- Left, decreased step length- Right, decreased step length- Left, decreased stride length, decreased hip/knee flexion- Left, and decreased ankle dorsiflexion- Left Distance walked: 200 ft Assistive device utilized: None Level of assistance: Min A Comments: Gait outdoors across uneven surface of grass. Pt exhibits decreased clearance and control of LLE during gait across uneven ground. Pt also exhibits decreased gait speed with onset of fatigue.   PATIENT EDUCATION: Education details: continue HEP, walking program in the home to work on gait with no AD Person educated: Patient and Spouse (wife Hunter Chang) Education method: Explanation Education comprehension: verbalized understanding   HOME EXERCISE PROGRAM:  Access Code: [email or web address] URL: [email or web address] Date: 10/26/2021 Prepared by: Excell Seltzer  Exercises - Alternating ConeTaps with  Counter Support  - 1 x daily - 7 x weekly - 3 sets - 10 reps - Backward Walking with Counter Support  - 1 x daily - 7 x weekly - 3 sets - 10 reps - Side Stepping with Resistance at Ankles and Counter Support  - 1 x daily - 7 x weekly - 3 sets - 10 reps - Forward Monster Walk with Resistance at Ankles and Counter Support  - 1 x daily - 7 x weekly - 3 sets - 10 reps - Standing Single Leg Stance with Counter Support  - 1 x daily - 7 x weekly - 1 sets - 5 reps - 30 hold - Sit to Stand on Foam Pad  - 1 x daily - 7 x weekly - 3 sets - 10 reps - Walking Step Over  - 1 x daily - 7 x weekly - 3 sets - 10 reps  Walking program: walk short distances in the home with no device and with a family member with you at all times holding onto a gait belt. Make sure you have multiple places to sit set up around the house so you can take breaks as needed.   GOALS: Goals reviewed with patient? Yes  SHORT TERM GOALS: Target date: 11/11/2021  Pt will improve gait velocity to at least 2.0 ft/sec for improved gait efficiency and performance at CGA level with LRAD. Baseline: 1.73 ft/sec (10/21/21), 3.64 ft/sec (8/14) Goal status: MET  2.  Pt will initiate HEP Baseline: established 10/26/21 Goal status: MET  3.  Pt will improve normal TUG to less than or equal to 15 seconds for improved functional mobility and decreased fall risk. Baseline: 19 sec (10/21/2021), 12.97 sec with RW/9.39 no AD (8/16) Goal status: MET  4.  Pt will perform Berg Balance Scale to assess fall risk and determine specific balance impairments Baseline: 30/56 on 8/1, 42/56 (8/15) Goal status: MET   LONG TERM GOALS: Target date: 12/02/2021  Pt will improve gait velocity to at least 4.0 ft/sec for improved gait efficiency and performance at Supervision level with LRAD. Baseline: 1.73 ft/sec (10/21/21), 3.64 ft/sec (8/14) Goal status: REVISED  2.  Pt will be independent with balance HEP for improved strength, balance, transfers and  gait. Baseline:  Goal status: INITIAL  3.  Pt will improve normal TUG to less than or equal to 10 seconds for improved functional mobility and decreased fall risk. Baseline: 19 sec (10/21/2021), 12.97 with RW/9.38 no AD (8/15) Goal status: MET  4.  Pt will improve Berg Balance Score to 50/56 to demonstrate decreased fall risk Baseline: 30/56 (8/1), 42/56 (8/15) Goal status: REVISED  5.  Pt to complete FGA and goal to be written Baseline:  Goal status: INITIAL     ASSESSMENT:  CLINICAL IMPRESSION: Emphasis of skilled PT session on reassessing STG and LTG and creating new LTG and/or revising current LTG due to pt meeting majority of his goals. Pt continues to show great progress with therapy and is working towards becoming more independent without use of AD. Pt has met 4/4 STG showing improvement in gait speed from 1.73 ft/sec to 3.64 ft/sec, improvement in balance as evidence by improvement in TUG score from 19 sec with RW upon initial eval to 12.97 sec with his RW and 9.38 sec without an AD, and shown improvement in balance with an improved BBS score from 30/56 to 42/56,  indicating a decreased fall risk. Pt remains very motivated and exhibits great participation in therapy sessions and continuation of HEP. Pt continues to benefit from skilled therapy services to address ongoing balance deficits and decreased safety and independence with functional mobility. Continue POC.   OBJECTIVE IMPAIRMENTS Abnormal gait, decreased balance, decreased coordination, and difficulty walking.   ACTIVITY LIMITATIONS carrying, lifting, bending, sitting, standing, squatting, stairs, transfers, and locomotion level  PARTICIPATION LIMITATIONS: driving, community activity, and church  PERSONAL FACTORS Age and 1-2 comorbidities:   arthritis, trigeminal neuralgia, acute on chronic intracranial subdural hematoma, skin fibroxanthoma, possible seizure disorder are also affecting patient's functional outcome.    REHAB POTENTIAL: Good  CLINICAL DECISION MAKING: Evolving/moderate complexity  EVALUATION COMPLEXITY: Moderate  PLAN: PT FREQUENCY: 2x/week  PT DURATION: 6 weeks  PLANNED INTERVENTIONS: Therapeutic exercises, Therapeutic activity, Neuromuscular re-education, Balance training, Gait training, Patient/Family education, Self Care, Joint mobilization, Stair training, Orthotic/Fit training, DME instructions, Cryotherapy, Moist heat, Taping, Manual therapy, and Re-evaluation  PLAN FOR NEXT SESSION:  work on static and dynamic standing balance, obstacle course, uneven surfaces, beam step-overs, resisted step-ups, gait with no AD, complete FGA and write goal, dual tasking during gait with no AD (carrying cup of water, etc. across various surfaces)    Excell Seltzer, PT, DPT, CSRS 11/10/2021, 11:45 AM

## 2021-11-15 ENCOUNTER — Ambulatory Visit: Payer: PPO | Admitting: Physical Therapy

## 2021-11-15 ENCOUNTER — Encounter: Payer: Self-pay | Admitting: Physical Therapy

## 2021-11-15 DIAGNOSIS — R2689 Other abnormalities of gait and mobility: Secondary | ICD-10-CM

## 2021-11-15 DIAGNOSIS — M6281 Muscle weakness (generalized): Secondary | ICD-10-CM | POA: Diagnosis not present

## 2021-11-15 NOTE — Therapy (Signed)
OUTPATIENT PHYSICAL THERAPY NEURO TREATMENT   Patient Name: Hunter Chang MRN: [id number] DOB:[birth year], 76 y.o., male Today's Date: 11/15/2021   PCP: Binnie Rail, MD REFERRING PROVIDER: Nicanor Bake, PA-C    PT End of Session - 11/15/21 1017     Visit Number 8    Number of Visits 13   plus eval   Date for PT Re-Evaluation 12/02/21    Authorization Type [employer]    Progress Note Due on Visit 10    PT Start Time 1016    PT Stop Time 1055    PT Time Calculation (min) 39 min    Equipment Utilized During Treatment Gait belt    Activity Tolerance Patient tolerated treatment well    Behavior During Therapy WFL for tasks assessed/performed                    Past Medical History:  Diagnosis Date   Arthritis    Chicken pox    [place] measles    Trigeminal neuralgia    Past Surgical History:  Procedure Laterality Date   HERNIA REPAIR     3 times   Patient Active Problem List   Diagnosis Date Noted   Glaucoma 10/23/2021   Fever 10/23/2021   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 81 for Muscle weakness (generalized)  THERAPY DIAG:  Muscle weakness (generalized)  Other abnormalities of gait and mobility  Rationale for Evaluation and Treatment Rehabilitation  SUBJECTIVE:                                                                                                                                                                                             SUBJECTIVE STATEMENT: Pt reports having a headache  above R eye this morning, has been having them intermittently. Describes his headache as a dull ache that lasts 3-4 sec. No falls or near falls. Pt reports he has not been exerting himself too much outside of therapy.   Pt accompanied by: self  PERTINENT HISTORY: PMH: arthritis, trigeminal neuralgia, acute on chronic intracranial subdural hematoma, skin fibroxanthoma, possible seizure disorder   PAIN:  Are you having pain? No, not currently  Pt does report he  occasionally has pain above his R eye, unsure if related to trigeminal neuralgia or tumor.  PATIENT GOALS:  strengthen LLE and LUE, to be able to hold something with LUE, return to playing tennis and active lifestyle  OBJECTIVE:   TODAY'S TREATMENT:  THER ACT:   [facility] PT Assessment - 11/15/21 1021       Functional Gait  Assessment   Gait assessed  Yes    Gait Level Surface Walks 20 ft in less than 7 sec but greater than 5.5 sec, uses assistive device, slower speed, mild gait deviations, or deviates 6-10 in outside of the 12 in walkway width.    Change in Gait Speed Able to change speed, demonstrates mild gait deviations, deviates 6-10 in outside of the 12 in walkway width, or no gait deviations, unable to achieve a major change in velocity, or uses a change in velocity, or uses an assistive device.    Gait with Horizontal Head Turns Performs head turns smoothly with slight change in gait velocity (eg, minor disruption to smooth gait path), deviates 6-10 in outside 12 in walkway width, or uses an assistive device.    Gait with Vertical Head Turns Performs task with slight change in gait velocity (eg, minor disruption to smooth gait path), deviates 6 - 10 in outside 12 in walkway width or uses assistive device    Gait and Pivot Turn Pivot turns safely in greater than 3 sec and stops with no loss of balance, or pivot turns safely within 3 sec and stops with mild imbalance, requires small steps to catch balance.    Step Over Obstacle Is  able to step over one shoe box (4.5 in total height) without changing gait speed. No evidence of imbalance.    Gait with Narrow Base of Support Ambulates less than 4 steps heel to toe or cannot perform without assistance.    Gait with Eyes Closed Walks 20 ft, uses assistive device, slower speed, mild gait deviations, deviates 6-10 in outside 12 in walkway width. Ambulates 20 ft in less than 9 sec but greater than 7 sec.    Ambulating Backwards Walks 20 ft, uses assistive device, slower speed, mild gait deviations, deviates 6-10 in outside 12 in walkway width.    Steps Alternating feet, must use rail.    Total Score 18    FGA comment: 18/30, high fall risk            Ambulation with no AD through obstacle course while holding cup of water with CGA for balance: -walking across mat, stepping on airex, 4" hurdle step-overs  Added in carrying kleenex box and cup of water with CGA for balance: -walking across mat, stepping on airex, 4" hurdle step-overs, alt L/R cone tap  Gait with no AD x 460 ft with CGA for balance using bungee cord for multi-directional perturbations and resistance, good ability to recover balance with only several LOB. Pt utilizes stepping strategy for majority of balance recovery, utilizes cross-over stepping during one instance.  Dicussed pt attempting more gait with no AD in the home as he demonstrates improved balance without use of AD in the clinic. Pt encouraged to attempt with Supervision assist from family initially but with progression to performing gait with no AD at mod I level. Pt encouraged to keep using RW in the community.   PATIENT EDUCATION: Education details: continue HEP, encouraged gait in the home with no RW Person educated: Patient  Education method: Explanation Education comprehension: verbalized understanding  HOME EXERCISE PROGRAM: Access Code: [email or web address] URL: [email or web address] Date: 10/26/2021 Prepared by: Excell Seltzer  Exercises - Alternating ConeTaps with Counter Support  - 1 x daily - 7 x weekly - 3 sets - 10 reps - Backward Walking with Counter Support  - 1 x daily - 7 x weekly - 3 sets - 10 reps - Side Stepping with Resistance at Ankles and Counter Support  - 1 x daily - 7 x weekly - 3 sets - 10 reps - Forward Monster Walk with Resistance at Ankles and Counter Support  - 1 x daily - 7 x weekly - 3 sets - 10 reps - Standing Single Leg Stance with Counter Support  - 1 x daily - 7 x weekly - 1 sets - 5 reps - 30 hold - Sit to Stand on Foam Pad  - 1 x daily - 7 x weekly - 3 sets - 10 reps - Walking Step Over  - 1 x daily - 7 x weekly - 3 sets - 10 reps  Walking program: walk short distances in the home with no device and with a family member with you at all times holding onto a gait belt. Make sure you have multiple places to sit set up around the house so you can take breaks as needed.   GOALS: Goals reviewed with patient? Yes  SHORT TERM GOALS: Target date: 11/11/2021  Pt will improve gait velocity to at least 2.0 ft/sec for improved gait efficiency and performance at CGA level with LRAD. Baseline: 1.73 ft/sec (10/21/21), 3.64 ft/sec (8/14) Goal status: MET  2.  Pt will initiate HEP Baseline: established 10/26/21 Goal status: MET  3.  Pt will improve normal TUG to less than or equal to 15 seconds for improved functional mobility and decreased fall risk. Baseline: 19 sec (10/21/2021), 12.97 sec with RW/9.39 no AD (8/16) Goal status: MET  4.  Pt will perform Berg Balance Scale to assess fall risk and determine specific balance impairments Baseline: 30/56 on 8/1, 42/56 (8/15) Goal status: MET   LONG TERM GOALS: Target date: 12/02/2021  Pt will improve gait velocity to at least 4.0 ft/sec for improved gait efficiency and performance at Supervision level with LRAD. Baseline: 1.73 ft/sec (10/21/21), 3.64 ft/sec (8/14) Goal status: REVISED  2.  Pt will be independent with balance HEP for  improved strength, balance, transfers and gait. Baseline:  Goal status: INITIAL  3.  Pt will improve normal TUG to less than or equal to 10 seconds for improved functional mobility and decreased fall risk. Baseline: 19 sec (10/21/2021), 12.97 with RW/9.38 no AD (8/15) Goal status: MET  4.  Pt will improve Berg Balance Score to 50/56 to demonstrate decreased fall risk Baseline: 30/56 (8/1), 42/56 (8/15) Goal status: REVISED  5.  Pt will improve FGA to 25/30 for decreased fall risk   Baseline: 18/30 (8/21) Goal status: INITIAL     ASSESSMENT:  CLINICAL IMPRESSION: Emphasis of skilled PT session on administering FGA (18/30) and setting a LTG for score improvement to demonstrate decreased fall risk. Also worked on dynamic gait and balance while dual-tasking as pt has personal goal of being able to bring his wife coffee and/or breakfast in the morning. Pt continues to demonstrate improved balance without use of AD this session so encouraged pt to ambulate around his home without use of AD, initially with Supervision with progression to mod I. Continue POC.   OBJECTIVE IMPAIRMENTS Abnormal gait, decreased balance, decreased coordination, and difficulty walking.   ACTIVITY  LIMITATIONS carrying, lifting, bending, sitting, standing, squatting, stairs, transfers, and locomotion level  PARTICIPATION LIMITATIONS: driving, community activity, and church  PERSONAL FACTORS Age and 1-2 comorbidities:   arthritis, trigeminal neuralgia, acute on chronic intracranial subdural hematoma, skin fibroxanthoma, possible seizure disorder are also affecting patient's functional outcome.   REHAB POTENTIAL: Good  CLINICAL DECISION MAKING: Evolving/moderate complexity  EVALUATION COMPLEXITY: Moderate  PLAN: PT FREQUENCY: 2x/week  PT DURATION: 6 weeks  PLANNED INTERVENTIONS: Therapeutic exercises, Therapeutic activity, Neuromuscular re-education, Balance training, Gait training, Patient/Family  education, Self Care, Joint mobilization, Stair training, Orthotic/Fit training, DME instructions, Cryotherapy, Moist heat, Taping, Manual therapy, and Re-evaluation  PLAN FOR NEXT SESSION:  work on static and dynamic standing balance, obstacle course, uneven surfaces, beam step-overs, resisted step-ups, gait with no AD, dual tasking during gait with no AD (carrying cup of water, etc. across various surfaces), components of FGA (tandem gait, backwards gait, gait with horizontal head turns)     Excell Seltzer, PT, DPT, CSRS 11/15/2021, 1:21 PM

## 2021-11-17 ENCOUNTER — Ambulatory Visit: Payer: PPO | Admitting: Physical Therapy

## 2021-11-17 DIAGNOSIS — M6281 Muscle weakness (generalized): Secondary | ICD-10-CM

## 2021-11-17 DIAGNOSIS — R2689 Other abnormalities of gait and mobility: Secondary | ICD-10-CM

## 2021-11-17 NOTE — Therapy (Signed)
OUTPATIENT PHYSICAL THERAPY NEURO TREATMENT   Patient Name: Hunter Chang MRN: [id number] DOB:28-Nov-1945, 76 y.o., male Today's Date: 11/17/2021   PCP: Binnie Rail, MD REFERRING PROVIDER: Nicanor Bake, PA-C    PT End of Session - 11/17/21 1315     Visit Number 9    Number of Visits 13   plus eval   Date for PT Re-Evaluation 12/02/21    Authorization Type [employer]    Progress Note Due on Visit 10    PT Start Time 1312    PT Stop Time 1356    PT Time Calculation (min) 44 min    Equipment Utilized During Treatment Gait belt    Activity Tolerance Patient tolerated treatment well    Behavior During Therapy WFL for tasks assessed/performed                     Past Medical History:  Diagnosis Date   Arthritis    Chicken pox    [place] measles    Trigeminal neuralgia    Past Surgical History:  Procedure Laterality Date   HERNIA REPAIR     3 times   Patient Active Problem List   Diagnosis Date Noted   Glaucoma 10/23/2021   Fever 10/23/2021   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 81 for Muscle weakness (generalized)  THERAPY DIAG:  Muscle weakness (generalized)  Other abnormalities of gait and mobility  Rationale for Evaluation and Treatment Rehabilitation  SUBJECTIVE:                                                                                                                                                                                             SUBJECTIVE STATEMENT: Pt reports he had a minor  headache this morning, Tylenol helped. He has been able to walk around his house without the RW and that is going well, does notice more fatigue towards the end of the day especially in his LLE. No pain, no falls or near falls.   Pt accompanied by: self  PERTINENT HISTORY: PMH: arthritis, trigeminal neuralgia, acute on chronic intracranial subdural hematoma, skin fibroxanthoma, possible seizure disorder   PAIN:  Are you having pain? No, not  currently  Pt does report he occasionally has pain above his R eye, unsure if related to trigeminal neuralgia or tumor.  PATIENT GOALS:  strengthen LLE and LUE, to be able to hold something with LUE, return to playing tennis and active lifestyle  OBJECTIVE:   TODAY'S TREATMENT:  THER ACT: Backwards gait with CGA for balance, 2 x 20 ft (narrow BOS, dec step length) Tandem gait with min HHA for balance, 4 x 20 ft with occasional LOB  Sit to stand with 4.4# weighted ball x 15 reps, pt rates as "easy" difficulty Sit to stand with 10# weighted ball x 15 reps, pt rates as "medium" difficulty, SOB  Standing alt LE beam step-overs with 10# ball slam, x 10 reps B, CGA for balance  Static stance on airex performing 2 kg ball toss against rebounder: -normal stance x 20 reps -Romberg stance 2 x 20 reps -modified L/R tandem 2 x 20 reps each -CGA needed for balance, no LOB noted  Alt L/R cone taps with gait with min A for balance, difficulty with LLE control and maintaining balance   GAIT: Gait pattern: step through pattern, decreased arm swing- Right, decreased arm swing- Left, decreased stride length, and poor foot clearance- Left Distance walked: 200 ft Assistive device utilized: None Level of assistance: Min A Comments: outdoors across uneven ground/grass, decrease in LLE clearance with onset of fatigue   PATIENT EDUCATION: Education details: continue HEP Person educated: Patient  Education method: Explanation Education comprehension:  verbalized understanding   HOME EXERCISE PROGRAM: Access Code: [email or web address] URL: [email or web address] Date: 10/26/2021 Prepared by: Excell Seltzer  Exercises - Alternating ConeTaps with Counter Support  - 1 x daily - 7 x weekly - 3 sets - 10 reps - Backward Walking with Counter Support  - 1 x daily - 7 x weekly - 3 sets - 10 reps - Side Stepping with Resistance at Ankles and Counter Support  - 1 x daily - 7 x weekly - 3 sets - 10 reps - Forward Monster Walk with Resistance at Ankles and Counter Support  - 1 x daily - 7 x weekly - 3 sets - 10 reps - Standing Single Leg Stance with Counter Support  - 1 x daily - 7 x weekly - 1 sets - 5 reps - 30 hold - Sit to Stand on Foam Pad  - 1 x daily - 7 x weekly - 3 sets - 10 reps - Walking Step Over  - 1 x daily - 7 x weekly - 3 sets - 10 reps  Walking program: walk short distances in the home with no device and with a family member with you at all times holding onto a gait belt. Make sure you have multiple places to sit set up around the house so you can take breaks as needed.   GOALS: Goals reviewed with patient? Yes  SHORT TERM GOALS: Target date: 11/11/2021  Pt will improve gait velocity to at least 2.0 ft/sec for improved gait efficiency and performance at CGA level with LRAD. Baseline: 1.73 ft/sec (10/21/21), 3.64 ft/sec (8/14) Goal status: MET  2.  Pt will initiate HEP Baseline: established 10/26/21 Goal status: MET  3.  Pt will improve normal TUG to less than or equal to 15 seconds for improved functional mobility and decreased fall risk. Baseline: 19 sec (10/21/2021), 12.97 sec with RW/9.39 no AD (8/16) Goal status: MET  4.  Pt will perform Berg Balance Scale to assess fall risk and determine specific balance impairments Baseline:  30/56 on 8/1, 42/56 (8/15) Goal status: MET   LONG TERM GOALS: Target date: 12/02/2021  Pt will improve gait velocity to at least 4.0 ft/sec for improved gait efficiency and performance at  Supervision level with LRAD. Baseline: 1.73 ft/sec (10/21/21), 3.64 ft/sec (8/14) Goal status: REVISED  2.  Pt will be independent with balance HEP for improved strength, balance, transfers and gait. Baseline:  Goal status: INITIAL  3.  Pt will improve normal TUG to less than or equal to 10 seconds for improved functional mobility and decreased fall risk. Baseline: 19 sec (10/21/2021), 12.97 with RW/9.38 no AD (8/15) Goal status: MET  4.  Pt will improve Berg Balance Score to 50/56 to demonstrate decreased fall risk Baseline: 30/56 (8/1), 42/56 (8/15) Goal status: REVISED  5.  Pt will improve FGA to 25/30 for decreased fall risk  Baseline: 18/30 (8/21) Goal status: INITIAL     ASSESSMENT:  CLINICAL IMPRESSION: Emphasis of skilled PT session on working on dynamic balance and functional dynamic strengthening with patient. He continues to exhibit decreased LLE control with onset of fatigue. Pt does exhibit improved overall balance and gait mechanics with decreased AD reliance noted. Pt also fatigues quickly this date, needs seated rest breaks to recover. Pt will continue to benefit from skilled therapy services to addressing ongoing balance, strength, and safety impairments. Continue POC.   OBJECTIVE IMPAIRMENTS Abnormal gait, decreased balance, decreased coordination, and difficulty walking.   ACTIVITY LIMITATIONS carrying, lifting, bending, sitting, standing, squatting, stairs, transfers, and locomotion level  PARTICIPATION LIMITATIONS: driving, community activity, and church  PERSONAL FACTORS Age and 1-2 comorbidities:   arthritis, trigeminal neuralgia, acute on chronic intracranial subdural hematoma, skin fibroxanthoma, possible seizure disorder are also affecting patient's functional outcome.   REHAB POTENTIAL: Good  CLINICAL DECISION MAKING: Evolving/moderate complexity  EVALUATION COMPLEXITY: Moderate  PLAN: PT FREQUENCY: 2x/week  PT DURATION: 6 weeks  PLANNED  INTERVENTIONS: Therapeutic exercises, Therapeutic activity, Neuromuscular re-education, Balance training, Gait training, Patient/Family education, Self Care, Joint mobilization, Stair training, Orthotic/Fit training, DME instructions, Cryotherapy, Moist heat, Taping, Manual therapy, and Re-evaluation  PLAN FOR NEXT SESSION:  work on static and dynamic standing balance, obstacle course, uneven surfaces, beam step-overs, resisted step-ups, gait with no AD, dual tasking during gait with no AD (carrying cup of water, etc. across various surfaces), components of FGA (tandem gait, backwards gait, gait with horizontal head turns), gait outdoors across uneven ground up/down hills, add to HEP for strength/balance (tandem gait), tennis drills?, ball bounce against wall, soccer ball cone drills?    Excell Seltzer, PT, DPT, CSRS 11/17/2021, 1:59 PM

## 2021-11-22 ENCOUNTER — Ambulatory Visit: Payer: PPO | Admitting: Physical Therapy

## 2021-11-22 DIAGNOSIS — M6281 Muscle weakness (generalized): Secondary | ICD-10-CM

## 2021-11-22 DIAGNOSIS — R2689 Other abnormalities of gait and mobility: Secondary | ICD-10-CM

## 2021-11-22 NOTE — Therapy (Signed)
OUTPATIENT PHYSICAL THERAPY NEURO TREATMENT   Patient Name: Hunter Chang MRN: [id number] DOB:January 05, 1946, 76 y.o., male Today's Date: 11/22/2021   PCP: Binnie Rail, MD REFERRING PROVIDER: Nicanor Bake, PA-C   Physical Therapy Progress Note   Dates of Reporting Period: 10/21/2021 - 11/22/2021  See Note below for Objective Data and Assessment of Progress/Goals.  Thank you for the referral of this patient. Excell Seltzer, PT, DPT, CSRS    PT End of Session - 11/22/21 1103     Visit Number 10    Number of Visits 13   plus eval   Date for PT Re-Evaluation 12/02/21    Authorization Type [employer]    Progress Note Due on Visit 10    PT Start Time 1102    PT Stop Time 1142    PT Time Calculation (min) 40 min    Equipment Utilized During Treatment Gait belt    Activity Tolerance Patient tolerated treatment well    Behavior During Therapy WFL for tasks assessed/performed                      Past Medical History:  Diagnosis Date   Arthritis    Chicken pox    [place] measles    Trigeminal neuralgia    Past Surgical History:  Procedure Laterality Date   HERNIA REPAIR     3 times   Patient Active Problem List   Diagnosis Date Noted   Glaucoma 10/23/2021   Fever 10/23/2021   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[facility]) 10/08/2021   Acute encephalopathy 10/08/2021   Left-sided weakness 10/08/2021   Seizures ([place]) 10/07/2021   Transient weakness of left lower extremity 09/29/2021   Fatigue 09/29/2021   Decreased libido 09/29/2021   Erectile dysfunction 04/28/2021   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 81 for Muscle weakness (generalized)  THERAPY DIAG:  Muscle weakness (generalized)  Other abnormalities of gait and mobility  Rationale for Evaluation and Treatment Rehabilitation   SUBJECTIVE:                                                                                                                                                                                              SUBJECTIVE STATEMENT: Pt reports his LLE feels heavy at end of the day, and he also feels fatigued in the evenings even though he is not doing much at home, just walking to the mailbox. Pt reports he has been walking around the  house without RW during the day, uses RW at night when more fatigued.  Pt accompanied by: self  PERTINENT HISTORY: PMH: arthritis, trigeminal neuralgia, acute on chronic intracranial subdural hematoma, skin fibroxanthoma, possible seizure disorder   PAIN:  Are you having pain? No, not currently  Pt does report he occasionally has pain above his R eye, unsure if related to trigeminal neuralgia or tumor.  PATIENT GOALS:  strengthen LLE and LUE, to be able to hold something with LUE, return to playing tennis and active lifestyle  OBJECTIVE:   TODAY'S TREATMENT:  NMR: Standing alt L/R ball kicks with progression to ball stop and kick return -one UE support for ball stop, 2 x 15 reps -no UE support for ball kicks, 2 x 15 reps  Sidesteps with ball toss against the wall -3 x 10 ft L/R with CGA for balance -added in dual task of naming animals, 3 x 10 ft with min A for balance, noted decrease in overall balance (one LOB requiring min A to recover), decreased speed and smoothness of movements with addition of cognitive task  Quadruped on mat table: -alt UE lifts x 10 reps -alt LE lifts x 10 reps with min A needed for core stabilization -terminated exercise due pain in BLE due to osgood-schlatters  Gait x 230 ft with min A for balance performing with ball bounce, several LOB requiring min A to recover   PATIENT EDUCATION: Education details: continue HEP Person educated: Patient  Education method: Explanation Education comprehension: verbalized understanding   HOME EXERCISE  PROGRAM: Access Code: [email or web address] URL: [email or web address] Date: 10/26/2021 Prepared by: Excell Seltzer  Exercises - Alternating ConeTaps with Counter Support  - 1 x daily - 7 x weekly - 3 sets - 10 reps - Backward Walking with Counter Support  - 1 x daily - 7 x weekly - 3 sets - 10 reps - Side Stepping with Resistance at Ankles and Counter Support  - 1 x daily - 7 x weekly - 3 sets - 10 reps - Forward Monster Walk with Resistance at Ankles and Counter Support  - 1 x daily - 7 x weekly - 3 sets - 10 reps - Standing Single Leg Stance with Counter Support  - 1 x daily - 7 x weekly - 1 sets - 5 reps - 30 hold - Sit to Stand on Foam Pad  - 1 x daily - 7 x weekly - 3 sets - 10 reps - Walking Step Over  - 1 x daily - 7 x weekly - 3 sets - 10 reps  Walking program: walk short distances in the home with no device and with a family member with you at all times holding onto a gait belt. Make sure you have multiple places to sit set up around the house so you can take breaks as needed.   GOALS: Goals reviewed with patient? Yes  SHORT TERM GOALS: Target date: 11/11/2021  Pt will improve gait velocity to at least 2.0 ft/sec for improved gait efficiency and performance at CGA level with LRAD. Baseline: 1.73 ft/sec (10/21/21), 3.64 ft/sec (8/14) Goal status: MET  2.  Pt will initiate HEP Baseline: established 10/26/21 Goal status: MET  3.  Pt will improve normal TUG to less than or equal to 15 seconds for improved functional mobility and decreased fall risk. Baseline: 19 sec (10/21/2021), 12.97 sec with RW/9.39 no AD (8/16) Goal status: MET  4.  Pt will perform Berg Balance Scale to assess fall risk and determine specific  balance impairments Baseline: 30/56 on 8/1, 42/56 (8/15) Goal status: MET   LONG TERM GOALS: Target date: 12/02/2021  Pt will improve gait velocity to at least 4.0 ft/sec for improved gait efficiency and performance at Supervision level with LRAD. Baseline: 1.73  ft/sec (10/21/21), 3.64 ft/sec (8/14) Goal status: REVISED  2.  Pt will be independent with balance HEP for improved strength, balance, transfers and gait. Baseline:  Goal status: INITIAL  3.  Pt will improve normal TUG to less than or equal to 10 seconds for improved functional mobility and decreased fall risk. Baseline: 19 sec (10/21/2021), 12.97 with RW/9.38 no AD (8/15) Goal status: MET  4.  Pt will improve Berg Balance Score to 50/56 to demonstrate decreased fall risk Baseline: 30/56 (8/1), 42/56 (8/15) Goal status: REVISED  5.  Pt will improve FGA to 25/30 for decreased fall risk  Baseline: 18/30 (8/21) Goal status: INITIAL     ASSESSMENT:  CLINICAL IMPRESSION: Emphasis of skilled PT session on performing NMR of LLE>RLE. Pt exhibits ongoing decreased control and strength of LLE and decreased balance noted with dual-tasking. Pt also exhibits ongoing fatigue and will benefit from ongoing education regarding energy conservation. Pt continues to benefit from skilled therapy services to address balance and strength impairments. Continue POC.   OBJECTIVE IMPAIRMENTS Abnormal gait, decreased balance, decreased coordination, and difficulty walking.   ACTIVITY LIMITATIONS carrying, lifting, bending, sitting, standing, squatting, stairs, transfers, and locomotion level  PARTICIPATION LIMITATIONS: driving, community activity, and church  PERSONAL FACTORS Age and 1-2 comorbidities:   arthritis, trigeminal neuralgia, acute on chronic intracranial subdural hematoma, skin fibroxanthoma, possible seizure disorder are also affecting patient's functional outcome.   REHAB POTENTIAL: Good  CLINICAL DECISION MAKING: Evolving/moderate complexity  EVALUATION COMPLEXITY: Moderate  PLAN: PT FREQUENCY: 2x/week  PT DURATION: 6 weeks  PLANNED INTERVENTIONS: Therapeutic exercises, Therapeutic activity, Neuromuscular re-education, Balance training, Gait training, Patient/Family education, Self  Care, Joint mobilization, Stair training, Orthotic/Fit training, DME instructions, Cryotherapy, Moist heat, Taping, Manual therapy, and Re-evaluation  PLAN FOR NEXT SESSION:  work on static and dynamic standing balance, obstacle course, uneven surfaces, beam step-overs, resisted step-ups, gait with no AD, dual tasking during gait with no AD (carrying cup of water, etc. across various surfaces), components of FGA (tandem gait, backwards gait, gait with horizontal head turns), gait outdoors across uneven ground up/down hills, add to HEP for strength/balance (tandem gait), tennis drills?, ball bounce against wall, soccer ball cone drills? energy conservation education   Excell Seltzer, PT, DPT, CSRS 11/22/2021, 11:49 AM

## 2021-11-24 ENCOUNTER — Ambulatory Visit: Payer: PPO | Admitting: Physical Therapy

## 2021-11-24 DIAGNOSIS — M6281 Muscle weakness (generalized): Secondary | ICD-10-CM

## 2021-11-24 DIAGNOSIS — R2689 Other abnormalities of gait and mobility: Secondary | ICD-10-CM

## 2021-11-24 NOTE — Therapy (Signed)
OUTPATIENT PHYSICAL THERAPY NEURO TREATMENT   Patient Name: Hunter Chang MRN: [id number] DOB:06-08-1945, 76 y.o., male Today's Date: 11/24/2021   PCP: Binnie Rail, MD REFERRING PROVIDER: Nicanor Bake, PA-C      PT End of Session - 11/24/21 1148     Visit Number 11    Number of Visits 13   plus eval   Date for PT Re-Evaluation 12/02/21    Authorization Type [employer]    Progress Note Due on Visit 14    PT Start Time 1145    PT Stop Time 1215   pt had to leave early for phone call   PT Time Calculation (min) 30 min    Equipment Utilized During Treatment Gait belt    Activity Tolerance Patient tolerated treatment well    Behavior During Therapy [facility] for tasks assessed/performed                       Past Medical History:  Diagnosis Date   Arthritis    Chicken pox    [place] measles    Trigeminal neuralgia    Past Surgical History:  Procedure Laterality Date   HERNIA REPAIR     3 times   Patient Active Problem List   Diagnosis Date Noted   Glaucoma 10/23/2021   Fever 10/23/2021   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[place]) 10/08/2021   Acute encephalopathy 10/08/2021   Left-sided weakness 10/08/2021   Seizures ([place]) 10/07/2021   Transient weakness of left lower extremity 09/29/2021   Fatigue 09/29/2021   Decreased libido 09/29/2021   Erectile dysfunction 04/28/2021   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 81 for Muscle weakness (generalized)  THERAPY DIAG:  Muscle weakness (generalized)  Other abnormalities of gait and mobility  Rationale for Evaluation and Treatment Rehabilitation   SUBJECTIVE:                                                                                                                                                                                              SUBJECTIVE STATEMENT: Pt reports that feels like his walking is good but his balance is still impaired. Pt reports his LLE still gets fatigued, especially towards the end of the day. Pt has been walking around his house without RW except at night when he is more fatigued.  Pt accompanied by: self  PERTINENT HISTORY: PMH: arthritis, trigeminal neuralgia, acute on chronic intracranial subdural hematoma, skin fibroxanthoma, possible seizure disorder  PAIN:  Are you having pain? No, not currently  Pt does report he occasionally has pain above his R eye, unsure if related to trigeminal neuralgia or tumor.  PATIENT GOALS:  strengthen LLE and LUE, to be able to hold something with LUE, return to playing tennis and active lifestyle  OBJECTIVE:   TODAY'S TREATMENT:  THER ACT: Ambulation with no AD and min HHA performing alt L/R cone and gum-drop taps. Pt continues to exhibit difficulty performing taps with LLE due to ongoing muscle weakness and impaired control of limb. Initially tried to perform task with no UE support but pt unable to maintain balance to safely perform task. Pt able to retrieve cones and gumdrops from the floor with min A for balance and to prevent fall.  GAIT: Gait pattern: step through pattern, decreased arm swing- Right, decreased arm swing- Left, decreased step length- Left, decreased hip/knee flexion- Left, decreased ankle dorsiflexion- Left, and poor foot clearance- Left Distance walked: 500 ft outdoors across uneven level ground and up/down hills Assistive device utilized: None Level of assistance: CGA Comments: Pt taken outdoors for gait in functional and community environment outdoors. Pt ambulates up/down grassy hills and across uneven ground, up/down curbs, etc. Pt does exhibit decreased LLE clearance and "scooting" of foot at times that increases with onset of fatigue.   PATIENT EDUCATION: Education details: continue HEP Person educated: Patient  Education  method: Explanation Education comprehension: verbalized understanding   HOME EXERCISE PROGRAM: Access Code: [email or web address] URL: [email or web address] Date: 10/26/2021 Prepared by: Excell Seltzer  Exercises - Alternating ConeTaps with Counter Support  - 1 x daily - 7 x weekly - 3 sets - 10 reps - Backward Walking with Counter Support  - 1 x daily - 7 x weekly - 3 sets - 10 reps - Side Stepping with Resistance at Ankles and Counter Support  - 1 x daily - 7 x weekly - 3 sets - 10 reps - Forward Monster Walk with Resistance at Ankles and Counter Support  - 1 x daily - 7 x weekly - 3 sets - 10 reps - Standing Single Leg Stance with Counter Support  - 1 x daily - 7 x weekly - 1 sets - 5 reps - 30 hold - Sit to Stand on Foam Pad  - 1 x daily - 7 x weekly - 3 sets - 10 reps - Walking Step Over  - 1 x daily - 7 x weekly - 3 sets - 10 reps  Walking program: walk short distances in the home with no device and with a family member with you at all times holding onto a gait belt. Make sure you have multiple places to sit set up around the house so you can take breaks as needed.   GOALS: Goals reviewed with patient? Yes  SHORT TERM GOALS: Target date: 11/11/2021  Pt will improve gait velocity to at least 2.0 ft/sec for improved gait efficiency and performance at CGA level with LRAD. Baseline: 1.73 ft/sec (10/21/21), 3.64 ft/sec (8/14) Goal status: MET  2.  Pt will initiate HEP Baseline: established 10/26/21 Goal status: MET  3.  Pt will improve normal TUG to less than or equal to 15 seconds for improved functional mobility and decreased fall risk. Baseline: 19 sec (10/21/2021), 12.97 sec with RW/9.39 no AD (8/16) Goal status: MET  4.  Pt will perform Berg Balance Scale to assess fall risk and determine specific balance impairments Baseline: 30/56 on 8/1, 42/56 (8/15) Goal status: MET   LONG TERM  GOALS: Target date: 12/02/2021  Pt will improve gait velocity to at least 4.0 ft/sec for  improved gait efficiency and performance at Supervision level with LRAD. Baseline: 1.73 ft/sec (10/21/21), 3.64 ft/sec (8/14) Goal status: REVISED  2.  Pt will be independent with balance HEP for improved strength, balance, transfers and gait. Baseline:  Goal status: INITIAL  3.  Pt will improve normal TUG to less than or equal to 10 seconds for improved functional mobility and decreased fall risk. Baseline: 19 sec (10/21/2021), 12.97 with RW/9.38 no AD (8/15) Goal status: MET  4.  Pt will improve Berg Balance Score to 50/56 to demonstrate decreased fall risk Baseline: 30/56 (8/1), 42/56 (8/15) Goal status: REVISED  5.  Pt will improve FGA to 25/30 for decreased fall risk  Baseline: 18/30 (8/21) Goal status: INITIAL     ASSESSMENT:  CLINICAL IMPRESSION: Emphasis of skilled PT session on working on gait in functional and community environment outdoors across uneven ground and up/down inclines as well as working on [employer] coordination training. Pt continues to exhibit ongoing LLE weakness and gait impairments limiting his safety and independence with functional mobility. Continue POC.   OBJECTIVE IMPAIRMENTS Abnormal gait, decreased balance, decreased coordination, and difficulty walking.   ACTIVITY LIMITATIONS carrying, lifting, bending, sitting, standing, squatting, stairs, transfers, and locomotion level  PARTICIPATION LIMITATIONS: driving, community activity, and church  PERSONAL FACTORS Age and 1-2 comorbidities:   arthritis, trigeminal neuralgia, acute on chronic intracranial subdural hematoma, skin fibroxanthoma, possible seizure disorder are also affecting patient's functional outcome.   REHAB POTENTIAL: Good  CLINICAL DECISION MAKING: Evolving/moderate complexity  EVALUATION COMPLEXITY: Moderate  PLAN: PT FREQUENCY: 2x/week  PT DURATION: 6 weeks  PLANNED INTERVENTIONS: Therapeutic exercises, Therapeutic activity, Neuromuscular re-education, Balance training, Gait  training, Patient/Family education, Self Care, Joint mobilization, Stair training, Orthotic/Fit training, DME instructions, Cryotherapy, Moist heat, Taping, Manual therapy, and Re-evaluation  PLAN FOR NEXT SESSION:  work on static and dynamic standing balance, obstacle course, uneven surfaces, beam step-overs, resisted step-ups, gait with no AD, dual tasking during gait with no AD (carrying cup of water, etc. across various surfaces), components of FGA (tandem gait, backwards gait, gait with horizontal head turns), gait outdoors across uneven ground up/down hills, add to HEP for strength/balance (tandem gait), tennis drills?, ball bounce against wall, soccer ball cone drills? energy conservation education   Excell Seltzer, PT, DPT, CSRS 11/24/2021, 12:18 PM

## 2021-11-30 ENCOUNTER — Emergency Department (HOSPITAL_COMMUNITY): Payer: PPO

## 2021-11-30 ENCOUNTER — Other Ambulatory Visit: Payer: Self-pay

## 2021-11-30 ENCOUNTER — Ambulatory Visit: Payer: PPO | Admitting: Physical Therapy

## 2021-11-30 ENCOUNTER — Emergency Department (HOSPITAL_COMMUNITY)
Admission: EM | Admit: 2021-11-30 | Discharge: 2021-12-01 | Disposition: A | Discharge status: Home / Self Care | Payer: PPO | Attending: Emergency Medicine | Admitting: Emergency Medicine

## 2021-11-30 ENCOUNTER — Encounter (HOSPITAL_COMMUNITY): Payer: Self-pay

## 2021-11-30 DIAGNOSIS — Z87898 Personal history of other specified conditions: Secondary | ICD-10-CM | POA: Diagnosis not present

## 2021-11-30 DIAGNOSIS — Z888 Allergy status to other drugs, medicaments and biological substances status: Secondary | ICD-10-CM | POA: Diagnosis not present

## 2021-11-30 DIAGNOSIS — R0902 Hypoxemia: Secondary | ICD-10-CM | POA: Diagnosis not present

## 2021-11-30 DIAGNOSIS — T380X5A Adverse effect of glucocorticoids and synthetic analogues, initial encounter: Secondary | ICD-10-CM | POA: Diagnosis not present

## 2021-11-30 DIAGNOSIS — D481 Neoplasm of uncertain behavior of connective and other soft tissue: Secondary | ICD-10-CM | POA: Diagnosis not present

## 2021-11-30 DIAGNOSIS — Y998 Other external cause status: Secondary | ICD-10-CM | POA: Diagnosis not present

## 2021-11-30 DIAGNOSIS — R911 Solitary pulmonary nodule: Secondary | ICD-10-CM | POA: Diagnosis not present

## 2021-11-30 DIAGNOSIS — Z923 Personal history of irradiation: Secondary | ICD-10-CM | POA: Diagnosis not present

## 2021-11-30 DIAGNOSIS — I10 Essential (primary) hypertension: Secondary | ICD-10-CM | POA: Diagnosis not present

## 2021-11-30 DIAGNOSIS — G936 Cerebral edema: Secondary | ICD-10-CM | POA: Diagnosis not present

## 2021-11-30 DIAGNOSIS — R253 Fasciculation: Secondary | ICD-10-CM | POA: Diagnosis not present

## 2021-11-30 DIAGNOSIS — R258 Other abnormal involuntary movements: Secondary | ICD-10-CM | POA: Diagnosis not present

## 2021-11-30 DIAGNOSIS — G939 Disorder of brain, unspecified: Secondary | ICD-10-CM | POA: Diagnosis not present

## 2021-11-30 DIAGNOSIS — R000 Tachycardia, unspecified: Secondary | ICD-10-CM | POA: Diagnosis not present

## 2021-11-30 DIAGNOSIS — W010XXA Fall on same level from slipping, tripping and stumbling without subsequent striking against object, initial encounter: Secondary | ICD-10-CM | POA: Diagnosis not present

## 2021-11-30 DIAGNOSIS — R9401 Abnormal electroencephalogram [EEG]: Secondary | ICD-10-CM | POA: Diagnosis not present

## 2021-11-30 DIAGNOSIS — C4490 Unspecified malignant neoplasm of skin, unspecified: Secondary | ICD-10-CM | POA: Diagnosis not present

## 2021-11-30 DIAGNOSIS — K7689 Other specified diseases of liver: Secondary | ICD-10-CM | POA: Diagnosis not present

## 2021-11-30 DIAGNOSIS — D485 Neoplasm of uncertain behavior of skin: Secondary | ICD-10-CM | POA: Diagnosis not present

## 2021-11-30 DIAGNOSIS — Z85841 Personal history of malignant neoplasm of brain: Secondary | ICD-10-CM | POA: Diagnosis not present

## 2021-11-30 DIAGNOSIS — Z79899 Other long term (current) drug therapy: Secondary | ICD-10-CM | POA: Diagnosis not present

## 2021-11-30 DIAGNOSIS — R0781 Pleurodynia: Secondary | ICD-10-CM | POA: Diagnosis not present

## 2021-11-30 DIAGNOSIS — G253 Myoclonus: Secondary | ICD-10-CM | POA: Diagnosis not present

## 2021-11-30 DIAGNOSIS — M25512 Pain in left shoulder: Secondary | ICD-10-CM | POA: Diagnosis not present

## 2021-11-30 DIAGNOSIS — R569 Unspecified convulsions: Secondary | ICD-10-CM | POA: Insufficient documentation

## 2021-11-30 DIAGNOSIS — Z043 Encounter for examination and observation following other accident: Secondary | ICD-10-CM | POA: Diagnosis not present

## 2021-11-30 DIAGNOSIS — G40909 Epilepsy, unspecified, not intractable, without status epilepticus: Secondary | ICD-10-CM | POA: Diagnosis not present

## 2021-11-30 DIAGNOSIS — G2581 Restless legs syndrome: Secondary | ICD-10-CM | POA: Diagnosis not present

## 2021-11-30 DIAGNOSIS — R739 Hyperglycemia, unspecified: Secondary | ICD-10-CM | POA: Diagnosis not present

## 2021-11-30 DIAGNOSIS — G9389 Other specified disorders of brain: Secondary | ICD-10-CM | POA: Diagnosis not present

## 2021-11-30 MED ORDER — LEVETIRACETAM IN NACL 1500 MG/100ML IV SOLN
1500.0000 mg | Freq: Once | INTRAVENOUS | Status: AC
  Administered 2021-12-01: 1500 mg via INTRAVENOUS
  Filled 2021-11-30: qty 100

## 2021-11-30 MED ORDER — LACTATED RINGERS IV BOLUS
1000.0000 mL | Freq: Once | INTRAVENOUS | Status: AC
  Administered 2021-12-01: 1000 mL via INTRAVENOUS

## 2021-11-30 NOTE — ED Provider Notes (Signed)
[place] [facility] EMERGENCY DEPARTMENT Provider Note   CSN: [id number] Arrival date & time: 11/30/21  2251     History {Add pertinent medical, surgical, social history, OB history to HPI:1} Chief Complaint  Patient presents with   Seizures    DOMINYK LAW is a 76 y.o. male.  76 year old male who presents ER today with seizure-like activity.  Patient reportedly had an episode of left upper and left lower extremity shaking.  Unclear how long it lasted.  Patient had Versed prior to arrival and is awake on command but not really able to to participate in history taking very well.   On review of the records it appears patient had an atypical fibroxanthoma status post resection is most recently as March of this year.  A couple months ago patient had an episode of seizure-like activity and was admitted to the hospital started on Keppra.  They thought maybe he had a surgical site infection and was transferred to [facility] however at that time they determined that it was likely just spread of his tumor.  He was set up for gamma knife which she received today.   Seizures      Home Medications Prior to Admission medications   Medication Sig Start Date End Date Taking? Authorizing Provider  latanoprost (XALATAN) 0.005 % ophthalmic solution Place 1 drop into both eyes at bedtime. 09/17/21   [provider]  levETIRAcetam (KEPPRA) 500 MG tablet Take 1 tablet (500 mg total) by mouth 2 (two) times daily. 10/11/21   Amin, Jeanella Flattery, MD  magnesium gluconate (MAGONATE) 500 MG tablet Take 500 mg by mouth daily.    [provider]  Multiple Vitamins-Minerals (ONE-A-DAY MENS 50+ ADVANTAGE) TABS Take 1 tablet by mouth daily.    [provider]  Multiple Vitamins-Minerals (ZINC PO) Take 50 mg by mouth daily.    [provider]  VITAMIN D PO Take 1 tablet by mouth daily.    [provider]      Allergies    Succinylcholine chloride    Review of  Systems   Review of Systems  Neurological:  Positive for seizures.    Physical Exam Updated Vital Signs BP 119/69 (BP Location: Right Arm)   Pulse (!) 102   Temp 97.9 F (36.6 C) (Oral)   Resp (!) 24   SpO2 (!) 89%  Physical Exam Vitals and nursing note reviewed.  Constitutional:      Appearance: He is well-developed.     Comments: Very sleepy on exam. Answers questions appropriately but have to keep waking him to get answers.   HENT:     Head: Normocephalic and atraumatic.     Nose: No congestion or rhinorrhea.     Mouth/Throat:     Mouth: Mucous membranes are dry.  Eyes:     Pupils: Pupils are equal, round, and reactive to light.     Comments: Sunken eyes  Cardiovascular:     Rate and Rhythm: Normal rate.  Pulmonary:     Effort: Pulmonary effort is normal. No respiratory distress.  Abdominal:     General: There is no distension.  Musculoskeletal:        General: No swelling or tenderness. Normal range of motion.     Cervical back: Normal range of motion.  Skin:    General: Skin is warm and dry.  Neurological:     General: No focal deficit present.     Mental Status: He is alert.  ED Results / Procedures / Treatments   Labs (all labs ordered are listed, but only abnormal results are displayed) Labs Reviewed  CBC WITH DIFFERENTIAL/PLATELET  COMPREHENSIVE METABOLIC PANEL    EKG None  Radiology No results found.  Procedures Procedures  {Document cardiac monitor, telemetry assessment procedure when appropriate:1}  Medications Ordered in ED Medications  lactated ringers bolus 1,000 mL (has no administration in time range)  levETIRAcetam (KEPPRA) IVPB 1500 mg/ 100 mL premix (has no administration in time range)    ED Course/ Medical Decision Making/ A&P                           Medical Decision Making Amount and/or Complexity of Data Reviewed Labs: ordered. Radiology: ordered.  Risk Prescription drug management.  Eval for worsening brain  extension/edema especially after GKRS today. Also appears dehydrated on exam so will check labs, fluids and give Keppra.  ***  {Document critical care time when appropriate:1} {Document review of labs and clinical decision tools ie heart score, Chads2Vasc2 etc:1}  {Document your independent review of radiology images, and any outside records:1} {Document your discussion with family members, caretakers, and with consultants:1} {Document social determinants of health affecting pt's care:1} {Document your decision making why or why not admission, treatments were needed:1} Final Clinical Impression(s) / ED Diagnoses Final diagnoses:  None    Rx / DC Orders ED Discharge Orders     None

## 2021-11-30 NOTE — ED Notes (Signed)
Patient transported to CT 

## 2021-11-30 NOTE — ED Triage Notes (Signed)
Patient has hx of seizures and is prescribed keppra. Patient had a tonic clonic seizure in both lower extremities usually only in the left lower. Per EMS patient has hx of brain tumor and has hx of seizures because of such. Received '5mg'$  versed IM en route. VSS. Patient was alert and oriented throughout the entire route here.

## 2021-12-01 ENCOUNTER — Telehealth (HOSPITAL_COMMUNITY): Payer: Self-pay | Admitting: Emergency Medicine

## 2021-12-01 ENCOUNTER — Telehealth: Payer: Self-pay | Admitting: *Deleted

## 2021-12-01 ENCOUNTER — Ambulatory Visit: Payer: PPO | Admitting: Physical Therapy

## 2021-12-01 DIAGNOSIS — R569 Unspecified convulsions: Secondary | ICD-10-CM | POA: Diagnosis not present

## 2021-12-01 DIAGNOSIS — Z043 Encounter for examination and observation following other accident: Secondary | ICD-10-CM | POA: Diagnosis not present

## 2021-12-01 DIAGNOSIS — R0781 Pleurodynia: Secondary | ICD-10-CM | POA: Diagnosis not present

## 2021-12-01 DIAGNOSIS — M25512 Pain in left shoulder: Secondary | ICD-10-CM | POA: Diagnosis not present

## 2021-12-01 LAB — CBC WITH DIFFERENTIAL/PLATELET
Abs Immature Granulocytes: 0.05 10*3/uL (ref 0.00–0.07)
Basophils Absolute: 0.1 10*3/uL (ref 0.0–0.1)
Basophils Relative: 1 %
Eosinophils Absolute: 0.2 10*3/uL (ref 0.0–0.5)
Eosinophils Relative: 2 %
HCT: 36.9 % — ABNORMAL LOW (ref 39.0–52.0)
Hemoglobin: 13 g/dL (ref 13.0–17.0)
Immature Granulocytes: 1 %
Lymphocytes Relative: 11 %
Lymphs Abs: 1.1 10*3/uL (ref 0.7–4.0)
MCH: 30.7 pg (ref 26.0–34.0)
MCHC: 35.2 g/dL (ref 30.0–36.0)
MCV: 87.2 fL (ref 80.0–100.0)
Monocytes Absolute: 0.9 10*3/uL (ref 0.1–1.0)
Monocytes Relative: 9 %
Neutro Abs: 7.8 10*3/uL — ABNORMAL HIGH (ref 1.7–7.7)
Neutrophils Relative %: 76 %
Platelets: 286 10*3/uL (ref 150–400)
RBC: 4.23 MIL/uL (ref 4.22–5.81)
RDW: 13.6 % (ref 11.5–15.5)
WBC: 10.2 10*3/uL (ref 4.0–10.5)
nRBC: 0 % (ref 0.0–0.2)

## 2021-12-01 LAB — COMPREHENSIVE METABOLIC PANEL
ALT: 20 U/L (ref 0–44)
AST: 25 U/L (ref 15–41)
Albumin: 3 g/dL — ABNORMAL LOW (ref 3.5–5.0)
Alkaline Phosphatase: 126 U/L (ref 38–126)
Anion gap: 11 (ref 5–15)
BUN: 16 mg/dL (ref 8–23)
CO2: 25 mmol/L (ref 22–32)
Calcium: 8.9 mg/dL (ref 8.9–10.3)
Chloride: 105 mmol/L (ref 98–111)
Creatinine, Ser: 1.04 mg/dL (ref 0.61–1.24)
GFR, Estimated: >60 mL/min (ref >60)
Glucose, Bld: 112 mg/dL — ABNORMAL HIGH (ref 70–99)
Potassium: 3.6 mmol/L (ref 3.5–5.1)
Sodium: 141 mmol/L (ref 135–145)
Total Bilirubin: 0.4 mg/dL (ref 0.3–1.2)
Total Protein: 5.9 g/dL — ABNORMAL LOW (ref 6.5–8.1)

## 2021-12-01 MED ORDER — LORAZEPAM 1 MG PO TABS: 1.0000 mg | ORAL_TABLET | Freq: Three times a day (TID) | ORAL | 0 refills | Status: DC | PRN

## 2021-12-01 MED ORDER — LORAZEPAM 2 MG/ML PO CONC: 2.0000 mg | Freq: Every day | ORAL | 0 refills | Status: DC | PRN

## 2021-12-01 NOTE — Telephone Encounter (Signed)
Pt wife called related to Rx: ativan being on backorder at her local pharmacy and her husband needing it now as he was shaving seizures...EDCM clarified with EDP (Trifan) to change Rx to: tablet (PO) form at her local pharmacy as [facility] verified with pharmacist that it is in stock.

## 2021-12-01 NOTE — Telephone Encounter (Signed)
Patient's wife called and stated that when they got home the patient had his usual weakness on the left side making it difficult for him to get the car.  He slid down a set on the ground.  She had a call 911 which helped him get up and get into the bed.  Approximate 10 to 15 minutes after get into the bed the patient had recurrent seizure-like activity on the left side similar to earlier in the night.  She asked if there is anything she could do about it.  She stated that had been going on about 2025 minutes.  I told her that he likely need to come back to the hospital so we can give him some other medications and probably admit him back to [facility] for further monitoring.  She was hesitant to do that and really wanted to give him something at home.  He has no benzodiazepines.  She asked if she could give him a dose of her Klonopin if that would help.  She stated it was not expired and it was a dose of 0.5 mg.  I told her that it would not be the preferred option, but she could give him 2 of those if she really did not want to bring him to the hospital but ultimately bring him back was the best bet.  She stated she would try that and if it did not improve should bring her back to the hospital otherwise will pick up his Ativan later in the day and talk to his oncologist.

## 2021-12-01 NOTE — Telephone Encounter (Signed)
Pharmacy reports that the patient is Unable to fill his current Ativan prescription because it is a liquid syrup and he did not have this in stock until tomorrow.  They have requested this be changed to oral Ativan tablets, which the patient reports he was able to take.  I have therefore sent a new prescription for oral Ativan.

## 2021-12-02 ENCOUNTER — Ambulatory Visit: Payer: PPO | Admitting: Physical Therapy

## 2021-12-02 ENCOUNTER — Ambulatory Visit: Payer: PPO | Admitting: Occupational Therapy

## 2021-12-02 LAB — LEVETIRACETAM LEVEL: Levetiracetam Lvl: 37.6 ug/mL (ref 10.0–40.0)

## 2021-12-03 ENCOUNTER — Ambulatory Visit: Payer: PPO | Admitting: Physical Therapy

## 2021-12-06 ENCOUNTER — Inpatient Hospital Stay: Payer: PPO | Admitting: Internal Medicine

## 2021-12-14 ENCOUNTER — Ambulatory Visit: Payer: PPO

## 2021-12-16 ENCOUNTER — Encounter: Payer: Self-pay | Admitting: Family Medicine

## 2021-12-16 ENCOUNTER — Encounter: Payer: Self-pay | Admitting: Internal Medicine

## 2021-12-16 DIAGNOSIS — D496 Neoplasm of unspecified behavior of brain: Secondary | ICD-10-CM | POA: Diagnosis not present

## 2021-12-16 DIAGNOSIS — G40109 Localization-related (focal) (partial) symptomatic epilepsy and epileptic syndromes with simple partial seizures, not intractable, without status epilepticus: Secondary | ICD-10-CM | POA: Diagnosis not present

## 2021-12-16 DIAGNOSIS — R569 Unspecified convulsions: Secondary | ICD-10-CM | POA: Diagnosis not present

## 2021-12-16 DIAGNOSIS — Z79899 Other long term (current) drug therapy: Secondary | ICD-10-CM | POA: Diagnosis not present

## 2021-12-16 MED ORDER — AMOXICILLIN-POT CLAVULANATE 875-125 MG PO TABS: 1.0000 | ORAL_TABLET | Freq: Two times a day (BID) | ORAL | 0 refills | Status: DC

## 2021-12-17 ENCOUNTER — Ambulatory Visit: Payer: PPO | Admitting: Internal Medicine

## 2021-12-20 ENCOUNTER — Encounter: Payer: Self-pay | Admitting: Internal Medicine

## 2021-12-20 ENCOUNTER — Telehealth (INDEPENDENT_AMBULATORY_CARE_PROVIDER_SITE_OTHER): Payer: PPO | Admitting: Internal Medicine

## 2021-12-20 DIAGNOSIS — L03115 Cellulitis of right lower limb: Secondary | ICD-10-CM | POA: Diagnosis not present

## 2021-12-20 MED ORDER — DOXYCYCLINE HYCLATE 100 MG PO TABS: 100.0000 mg | ORAL_TABLET | Freq: Two times a day (BID) | ORAL | 0 refills | Status: AC

## 2021-12-20 NOTE — Assessment & Plan Note (Signed)
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

## 2021-12-20 NOTE — Progress Notes (Signed)
Virtual Visit via Video Note  I connected with Hunter Chang on 12/20/21 at 10:20 AM EDT by a video enabled telemedicine application and verified that I am speaking with the correct person using two identifiers.   I discussed the limitations of evaluation and management by telemedicine and the availability of in person appointments. The patient expressed understanding and agreed to proceed.  Present for the visit:  Myself, Dr Billey Gosling, Perrin Smack.  The patient is currently at home and I am in the office.    No referring provider.    History of Present Illness: He is here for an acute visit.    Came home from hospital  -his right foot was erythematous, with peeling and he had some pustules in the posterior heel/Achilles area.  The redness extended to the bottom portion of the foot.  He is not having any fever and does not feel that.  3 days ago I did send in antibiotic given his high risk for infection.  He has also been on steroids for the brain tumor.  He has been taking the Augmentin as prescribed.  The pustules have resolved and have scabbed over.  His wife states that the erythema is better, but still present.  He still has some peeling of the skin.  He denies any fevers, pain in his wife does not feel the area feels warm.  He is on steroids for the swelling around his brain tumor.     Social History   Socioeconomic History   Marital status: Married    Spouse name: Not on file   Number of children: Not on file   Years of education: Not on file   Highest education level: Not on file  Occupational History   Not on file  Tobacco Use   Smoking status: Never   Smokeless tobacco: Never  Vaping Use   Vaping Use: Never used  Substance and Sexual Activity   Alcohol use: No   Drug use: No   Sexual activity: Yes  Other Topics Concern   Not on file  Social History Narrative   Not on file   Social Determinants of Health   Financial Resource Strain: Low Risk  (12/12/2020)    Overall Financial Resource Strain (CARDIA)    Difficulty of Paying Living Expenses: Not hard at all  Food Insecurity: No Food Insecurity (12/12/2020)   Hunger Vital Sign    Worried About Running Out of Food in the Last Year: Never true    Ran Out of Food in the Last Year: Never true  Transportation Needs: No Transportation Needs (12/12/2020)   PRAPARE - Hydrologist (Medical): No    Lack of Transportation (Non-Medical): No  Physical Activity: Sufficiently Active (12/12/2020)   Exercise Vital Sign    Days of Exercise per Week: 5 days    Minutes of Exercise per Session: 50 min  Stress: No Stress Concern Present (12/12/2020)   [place]    Feeling of Stress : Not at all  Social Connections: [place] (12/12/2020)   Social Connection and Isolation Panel [NHANES]    Frequency of Communication with Friends and Family: More than three times a week    Frequency of Social Gatherings with Friends and Family: More than three times a week    Attends Religious Services: More than 4 times per year    Active Member of Clubs or Organizations: Yes    Attends  Music therapist: More than 4 times per year    Marital Status: Married     Observations/Objective: Appears well in NAD Bandage over right upper head where his brain tumor is Breathing normally and does not appear to be in any distress Right foot with erythema along Achilles and bottom of foot with peeling, posterior heel with scabbed lesions where the pustules were No obvious breaks in skin and foot or ulcers   Assessment and Plan:  See Problem List for Assessment and Plan of chronic medical problems.   Follow Up Instructions:    I discussed the assessment and treatment plan with the patient. The patient was provided an opportunity to ask questions and all were answered. The patient agreed with the plan and demonstrated  an understanding of the instructions.   The patient was advised to call back or seek an in-person evaluation if the symptoms worsen or if the condition fails to improve as anticipated.    Binnie Rail, MD

## 2021-12-22 ENCOUNTER — Ambulatory Visit: Payer: PPO | Admitting: Physical Therapy

## 2021-12-28 DIAGNOSIS — C4490 Unspecified malignant neoplasm of skin, unspecified: Secondary | ICD-10-CM | POA: Diagnosis not present

## 2021-12-28 DIAGNOSIS — Z23 Encounter for immunization: Secondary | ICD-10-CM | POA: Diagnosis not present

## 2021-12-28 DIAGNOSIS — D485 Neoplasm of uncertain behavior of skin: Secondary | ICD-10-CM | POA: Diagnosis not present

## 2021-12-28 DIAGNOSIS — C4449 Other specified malignant neoplasm of skin of scalp and neck: Secondary | ICD-10-CM | POA: Diagnosis not present

## 2021-12-28 DIAGNOSIS — D496 Neoplasm of unspecified behavior of brain: Secondary | ICD-10-CM | POA: Diagnosis not present

## 2021-12-31 DIAGNOSIS — D485 Neoplasm of uncertain behavior of skin: Secondary | ICD-10-CM | POA: Diagnosis not present

## 2021-12-31 DIAGNOSIS — Z483 Aftercare following surgery for neoplasm: Secondary | ICD-10-CM | POA: Diagnosis not present

## 2021-12-31 DIAGNOSIS — Z888 Allergy status to other drugs, medicaments and biological substances status: Secondary | ICD-10-CM | POA: Diagnosis not present

## 2022-01-04 ENCOUNTER — Ambulatory Visit: Payer: PPO | Admitting: Physical Therapy

## 2022-01-04 DIAGNOSIS — Z7982 Long term (current) use of aspirin: Secondary | ICD-10-CM | POA: Diagnosis not present

## 2022-01-04 DIAGNOSIS — A419 Sepsis, unspecified organism: Secondary | ICD-10-CM | POA: Diagnosis not present

## 2022-01-04 DIAGNOSIS — R509 Fever, unspecified: Secondary | ICD-10-CM | POA: Diagnosis not present

## 2022-01-04 DIAGNOSIS — J704 Drug-induced interstitial lung disorders, unspecified: Secondary | ICD-10-CM | POA: Diagnosis not present

## 2022-01-04 DIAGNOSIS — J9600 Acute respiratory failure, unspecified whether with hypoxia or hypercapnia: Secondary | ICD-10-CM | POA: Diagnosis not present

## 2022-01-04 DIAGNOSIS — Z85831 Personal history of malignant neoplasm of soft tissue: Secondary | ICD-10-CM | POA: Diagnosis not present

## 2022-01-04 DIAGNOSIS — Z9981 Dependence on supplemental oxygen: Secondary | ICD-10-CM | POA: Diagnosis not present

## 2022-01-04 DIAGNOSIS — C4490 Unspecified malignant neoplasm of skin, unspecified: Secondary | ICD-10-CM | POA: Diagnosis not present

## 2022-01-04 DIAGNOSIS — R6520 Severe sepsis without septic shock: Secondary | ICD-10-CM | POA: Diagnosis not present

## 2022-01-04 DIAGNOSIS — L03314 Cellulitis of groin: Secondary | ICD-10-CM | POA: Diagnosis not present

## 2022-01-04 DIAGNOSIS — R0689 Other abnormalities of breathing: Secondary | ICD-10-CM | POA: Diagnosis not present

## 2022-01-04 DIAGNOSIS — R531 Weakness: Secondary | ICD-10-CM | POA: Diagnosis not present

## 2022-01-04 DIAGNOSIS — R4182 Altered mental status, unspecified: Secondary | ICD-10-CM | POA: Diagnosis not present

## 2022-01-04 DIAGNOSIS — R54 Age-related physical debility: Secondary | ICD-10-CM | POA: Diagnosis not present

## 2022-01-04 DIAGNOSIS — K219 Gastro-esophageal reflux disease without esophagitis: Secondary | ICD-10-CM | POA: Diagnosis not present

## 2022-01-04 DIAGNOSIS — R918 Other nonspecific abnormal finding of lung field: Secondary | ICD-10-CM | POA: Diagnosis not present

## 2022-01-04 DIAGNOSIS — D496 Neoplasm of unspecified behavior of brain: Secondary | ICD-10-CM | POA: Diagnosis not present

## 2022-01-04 DIAGNOSIS — R000 Tachycardia, unspecified: Secondary | ICD-10-CM | POA: Diagnosis not present

## 2022-01-04 DIAGNOSIS — E440 Moderate protein-calorie malnutrition: Secondary | ICD-10-CM | POA: Diagnosis not present

## 2022-01-04 DIAGNOSIS — R569 Unspecified convulsions: Secondary | ICD-10-CM | POA: Diagnosis not present

## 2022-01-04 DIAGNOSIS — C490 Malignant neoplasm of connective and soft tissue of head, face and neck: Secondary | ICD-10-CM | POA: Diagnosis not present

## 2022-01-04 DIAGNOSIS — J702 Acute drug-induced interstitial lung disorders: Secondary | ICD-10-CM | POA: Diagnosis not present

## 2022-01-04 DIAGNOSIS — H409 Unspecified glaucoma: Secondary | ICD-10-CM | POA: Diagnosis not present

## 2022-01-04 DIAGNOSIS — D485 Neoplasm of uncertain behavior of skin: Secondary | ICD-10-CM | POA: Diagnosis not present

## 2022-01-04 DIAGNOSIS — Z20822 Contact with and (suspected) exposure to covid-19: Secondary | ICD-10-CM | POA: Diagnosis not present

## 2022-01-04 DIAGNOSIS — D210 Benign neoplasm of connective and other soft tissue of head, face and neck: Secondary | ICD-10-CM | POA: Diagnosis not present

## 2022-01-04 DIAGNOSIS — N492 Inflammatory disorders of scrotum: Secondary | ICD-10-CM | POA: Diagnosis not present

## 2022-01-04 DIAGNOSIS — R0602 Shortness of breath: Secondary | ICD-10-CM | POA: Diagnosis not present

## 2022-01-04 DIAGNOSIS — K7689 Other specified diseases of liver: Secondary | ICD-10-CM | POA: Diagnosis not present

## 2022-01-04 DIAGNOSIS — S0100XS Unspecified open wound of scalp, sequela: Secondary | ICD-10-CM | POA: Diagnosis not present

## 2022-01-06 ENCOUNTER — Encounter: Payer: PPO | Admitting: Internal Medicine

## 2022-01-06 ENCOUNTER — Ambulatory Visit: Payer: PPO | Admitting: Occupational Therapy

## 2022-01-10 ENCOUNTER — Ambulatory Visit: Payer: PPO | Admitting: Physical Therapy

## 2022-01-18 ENCOUNTER — Encounter: Payer: Self-pay | Admitting: Internal Medicine

## 2022-01-24 ENCOUNTER — Ambulatory Visit: Payer: PPO | Attending: Physician Assistant | Admitting: Physical Therapy

## 2022-01-24 DIAGNOSIS — M6281 Muscle weakness (generalized): Secondary | ICD-10-CM | POA: Diagnosis not present

## 2022-01-24 DIAGNOSIS — R2689 Other abnormalities of gait and mobility: Secondary | ICD-10-CM | POA: Insufficient documentation

## 2022-01-24 DIAGNOSIS — R2681 Unsteadiness on feet: Secondary | ICD-10-CM | POA: Insufficient documentation

## 2022-01-24 NOTE — Therapy (Signed)
OUTPATIENT PHYSICAL THERAPY NEURO EVALUATION   Patient Name: Hunter Chang MRN: [id number] DOB:[birth year], 76 y.o., male Today's Date: 01/24/2022   PCP: Binnie Rail, MD REFERRING PROVIDER: Francina Ames, MD    PT End of Session - 01/24/22 0932     Visit Number 1    Number of Visits 17   with eval   Date for PT Re-Evaluation 04/04/22   to allow for scheduling delays   Authorization Type [employer]    Progress Note Due on Visit 10    PT Start Time 0930    PT Stop Time 1010    PT Time Calculation (min) 40 min    Equipment Utilized During Treatment Gait belt    Activity Tolerance Patient tolerated treatment well    Behavior During Therapy [facility] for tasks assessed/performed             Past Medical History:  Diagnosis Date   Arthritis    Chicken pox    [place] measles    Trigeminal neuralgia    Past Surgical History:  Procedure Laterality Date   HERNIA REPAIR     3 times   Patient Active Problem List   Diagnosis Date Noted   Cellulitis of right foot 12/20/2021   Glaucoma 10/23/2021   Fever 10/23/2021   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[place]) 10/08/2021   Acute encephalopathy 10/08/2021   Left-sided weakness 10/08/2021   Seizures ([place]) 10/07/2021   Transient weakness of left lower extremity 09/29/2021   Fatigue 09/29/2021   Decreased libido 09/29/2021   Erectile dysfunction 04/28/2021   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   COVID 08/26/2020   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    ONSET DATE: 01/19/2022   REFERRING DIAG: R53.1 (ICD-10-CM) - Left-sided weakness   THERAPY DIAG:  Muscle weakness (generalized)  Other abnormalities of gait and mobility  Unsteadiness on feet  Rationale for Evaluation and Treatment: Rehabilitation  SUBJECTIVE:                                                                                                                                                                                              SUBJECTIVE STATEMENT: Pt reports he has been in/out of the hospital several times since last seen by PT due to a bad response to [facility]. Per pt and wife they report the gamma knife radiation has shrunk his tumor, he did not respond well to immunotherapy Beryle Flock), and if they opt to not do chemotherapy he may possibly have radiation again. Pt has a follow-up MRI in December. [name] wife also reports he is dealing with pneumonitis (inflammation of  lung tissue).   Pt accompanied by: significant other [name]  PERTINENT HISTORY: PMH: arthritis, trigeminal neuralgia, acute on chronic intracranial subdural hematoma, skin fibroxanthoma, possible seizure disorder   PAIN:  Are you having pain? No  PRECAUTIONS: Fall  WEIGHT BEARING RESTRICTIONS: No  FALLS: Has patient fallen in last 6 months? No  LIVING ENVIRONMENT: Lives with: lives with their spouse Lives in: House/apartment Stairs: No Has following equipment at home: Environmental consultant - 2 wheeled  PLOF: Independent with gait, Independent with transfers, and Requires assistive device for independence  PATIENT GOALS: "to get rid of the walker"  OBJECTIVE:   DIAGNOSTIC FINDINGS:  head CT 12/01/2021  IMPRESSION: 1. No acute intracranial hemorrhage. 2. Interval progression of white matter edema in the right parietal and occipital lobe compared to the CT of 10/07/2021. There is approximately 2 mm right to left midline shift.  COGNITION: Overall cognitive status: Impaired; per wife pt has some memory deficits   SENSATION: WFL per pt report  COORDINATION: Decreased with LLE as compared to RLE  POSTURE: rounded shoulders and forward head  LOWER EXTREMITY MMT:    MMT Right Eval Left Eval  Hip flexion 5 4  Hip extension    Hip abduction    Hip adduction    Hip internal rotation    Hip external rotation    Knee flexion 4+ 4  Knee extension 5 4  Ankle dorsiflexion 5 4  Ankle plantarflexion    Ankle inversion    Ankle  eversion    (Blank rows = not tested)  BED MOBILITY:  Independent per pt report  TRANSFERS: Assistive device utilized: Environmental consultant - 2 wheeled  Sit to stand: Modified independence Stand to sit: Modified independence Chair to chair: Modified independence Floor:  not assessed at eval  GAIT: Gait pattern: decreased hip/knee flexion- Left, decreased ankle dorsiflexion- Left, and trunk flexed Distance walked: various clinic distances Assistive device utilized: Environmental consultant - 2 wheeled Level of assistance: Modified independence Comments: to be further assessed next session  FUNCTIONAL TESTS:    [facility] PT Assessment - 01/24/22 0944       Ambulation/Gait   Gait velocity 32.8 ft over 15.57 sec = 2.1 ft/sec      Standardized Balance Assessment   Standardized Balance Assessment Timed Up and Go Test;Five Times Sit to Stand;Berg Balance Test    Five times sit to stand comments  14.75 sec with one UE support on arm of chair, use of RW      Berg Balance Test   Sit to Stand Able to stand  independently using hands    Standing Unsupported Able to stand 2 minutes with supervision    Sitting with Back Unsupported but Feet Supported on Floor or Stool Able to sit safely and securely 2 minutes    Stand to Sit Sits safely with minimal use of hands    Transfers Able to transfer safely, definite need of hands    Standing Unsupported with Eyes Closed Able to stand 10 seconds with supervision    Standing Unsupported with Feet Together Able to place feet together independently and stand for 1 minute with supervision    From Standing, Reach Forward with Outstretched Arm Loses balance while trying/requires external support    From Standing Position, Pick up Object from Floor Unable to try/needs assist to keep balance    From Standing Position, Turn to Look Behind Over each Shoulder Turn sideways only but maintains balance    Turn 360 Degrees Needs assistance while turning  Standing Unsupported, Alternately Place  Feet on Step/Stool Able to complete 4 steps without aid or supervision    Standing Unsupported, One Foot in Front Needs help to step but can hold 15 seconds    Standing on One Leg Unable to try or needs assist to prevent fall    Total Score 28    Berg comment: 28/56      Timed Up and Go Test   TUG Normal TUG    Normal TUG (seconds) 14.19   with RW            TODAY'S TREATMENT:  PT evaluation                                                                                                                              PATIENT EDUCATION: Education details: Eval findings, POC Person educated: Patient and Spouse Education method: Customer service manager Education comprehension: verbalized understanding and needs further education  HOME EXERCISE PROGRAM: To be established next session   GOALS: Goals reviewed with patient? Yes  SHORT TERM GOALS: Target date: 02/21/2022  Pt will be independent with initial HEP for improved strength, balance, transfers and gait. Baseline: Goal status: INITIAL  2.  Pt will improve gait velocity to at least 2.5 ft/sec for improved gait efficiency and performance at mod I level  Baseline: 2.1 ft/sec with RW (10/30) Goal status: INITIAL  3.  Pt will improve Berg score to 37/56 for decreased fall risk Baseline: 28/56 (10/30) Goal status: INITIAL  4.  6MWT to be assessed and LTG set Baseline:  Goal status: INITIAL   LONG TERM GOALS: Target date: 03/21/2022  Pt will be independent with final HEP for improved strength, balance, transfers and gait. Baseline:  Goal status: INITIAL  2.  Pt will improve gait velocity to at least 3.0 ft/sec for improved gait efficiency and performance at mod I level  Baseline:  Goal status: INITIAL  3.  Pt will improve Berg score to 46/56 for decreased fall risk Baseline: 28/56 (10/30) Goal status: INITIAL  4.  6MWT to be assessed and LTG set Baseline:  Goal status:  INITIAL   ASSESSMENT:  CLINICAL IMPRESSION: Patient is a 76 year old male referred to Neuro OPPT for L side weakness due to atypical fibroxanthoma.  [name] PMH is significant for: arthritis, trigeminal neuralgia, acute on chronic intracranial subdural hematoma, skin fibroxanthoma, possible seizure disorder.  The following deficits were present during the exam: decreased gait speed, decreased balance, increased fall risk, decreased LE strength and coordination. Based on his gait speed of 2.1 ft/sec and BBS score of 28/56, pt is an increased risk for falls. Pt would benefit from skilled PT to address these impairments and functional limitations to maximize functional mobility independence.  OBJECTIVE IMPAIRMENTS: decreased balance, decreased coordination, decreased endurance, difficulty walking, decreased strength, and decreased safety awareness.   ACTIVITY LIMITATIONS: carrying, lifting, bending, squatting, stairs, and transfers  PARTICIPATION LIMITATIONS: interpersonal relationship, driving, community activity,  and church  PERSONAL FACTORS: Age and 1-2 comorbidities:     arthritis, trigeminal neuralgia, acute on chronic intracranial subdural hematoma, skin fibroxanthoma, possible seizure disorder are also affecting patient's functional outcome.   REHAB POTENTIAL: Good  CLINICAL DECISION MAKING: Stable/uncomplicated  EVALUATION COMPLEXITY: Moderate  PLAN:  PT FREQUENCY: 2x/week  PT DURATION: 10 weeks (17 total visits)  PLANNED INTERVENTIONS: Therapeutic exercises, Therapeutic activity, Neuromuscular re-education, Balance training, Gait training, Patient/Family education, Self Care, Joint mobilization, Stair training, Vestibular training, Canalith repositioning, Visual/preceptual remediation/compensation, Orthotic/Fit training, DME instructions, Aquatic Therapy, Dry Needling, Cognitive remediation, Electrical stimulation, Cryotherapy, Moist heat, Taping, Manual therapy, and  Re-evaluation  PLAN FOR NEXT SESSION: review prior HEP ([id number]) and initiate new HEP along with formal walking program; balance based on BBS deficits (EC, tandem stance, turning, SLS), assess 6MWT and write LTG   Excell Seltzer, PT, DPT, CSRS 01/24/2022, 10:15 AM

## 2022-01-25 DIAGNOSIS — D485 Neoplasm of uncertain behavior of skin: Secondary | ICD-10-CM | POA: Diagnosis not present

## 2022-01-25 DIAGNOSIS — Z888 Allergy status to other drugs, medicaments and biological substances status: Secondary | ICD-10-CM | POA: Diagnosis not present

## 2022-01-31 ENCOUNTER — Ambulatory Visit: Payer: PPO | Attending: Physician Assistant | Admitting: Physical Therapy

## 2022-01-31 DIAGNOSIS — R2681 Unsteadiness on feet: Secondary | ICD-10-CM

## 2022-01-31 DIAGNOSIS — R2689 Other abnormalities of gait and mobility: Secondary | ICD-10-CM

## 2022-01-31 DIAGNOSIS — M6281 Muscle weakness (generalized): Secondary | ICD-10-CM

## 2022-01-31 NOTE — Therapy (Signed)
OUTPATIENT PHYSICAL THERAPY NEURO TREATMENT   Patient Name: EWAN GRAU MRN: [id number] DOB:[birth year], 76 y.o., male Today's Date: 01/31/2022   PCP: Binnie Rail, MD REFERRING PROVIDER: Francina Ames, MD    PT End of Session - 01/31/22 0935     Visit Number 2    Number of Visits 17   with eval   Date for PT Re-Evaluation 04/04/22   to allow for scheduling delays   Authorization Type [employer]    Progress Note Due on Visit 10    PT Start Time 0932    Equipment Utilized During Treatment Gait belt    Activity Tolerance Patient tolerated treatment well    Behavior During Therapy [facility] for tasks assessed/performed             Past Medical History:  Diagnosis Date   Arthritis    Chicken pox    [place] measles    Trigeminal neuralgia    Past Surgical History:  Procedure Laterality Date   HERNIA REPAIR     3 times   Patient Active Problem List   Diagnosis Date Noted   Cellulitis of right foot 12/20/2021   Glaucoma 10/23/2021   Fever 10/23/2021   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[place]) 10/08/2021   Acute encephalopathy 10/08/2021   Left-sided weakness 10/08/2021   Seizures ([place]) 10/07/2021   Transient weakness of left lower extremity 09/29/2021   Fatigue 09/29/2021   Decreased libido 09/29/2021   Erectile dysfunction 04/28/2021   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   COVID 08/26/2020   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 About 20 min.   PERTINENT HISTORY: PMH: arthritis, trigeminal neuralgia, acute on chronic intracranial subdural hematoma, skin fibroxanthoma, possible seizure disorder   PAIN:  Are you having pain? No  PRECAUTIONS: Fall   TODAY'S TREATMENT:      [facility] Adult PT Treatment/Exercise - 01/31/22 0001       Ambulation/Gait   Ambulation/Gait Yes    Ambulation/Gait Assistance 5: Supervision    Ambulation/Gait Assistance Details 6 min walk, no standing  or seated rest breaks.  Progressed training working on heel-toe gait pattern.    Ambulation Distance (Feet) 935 Feet    Assistive device Rolling walker    Gait Pattern Right foot flat;Left foot flat;Step-through pattern    Ambulation Surface Level    Gait Comments --      Knee/Hip Exercises: Aerobic   Stepper seated, all extremities, cues for ROM and L knee position. Level 5.0 , speed  30 steps/ min.          Balance Exercises - 01/31/22 0001       Balance Exercises: Standing   Sit to Stand Standard surface;Foam/compliant surface;Other (comment)   performed without UE support, initially with feet on pillows but down graded to solid surface due to pt requiring Min A, x5.                                                                                         PATIENT EDUCATION: Education details: Explained how current HEP is appropriate and how pt can progress exercises working on decreased UE support. Person educated: Patient and Spouse Education method: Customer service manager Education comprehension: verbalized understanding and needs further education  HOME EXERCISE PROGRAM: Access Code: [email or web address] URL: [email or web address] Date: 01/31/2022 Prepared by: Oneita Kras- Reviewed with pt and think this HEP is still appropriate.  Exercises - Alternating Step Taps with Counter Support  - 1 x daily - 7 x weekly - 3 sets - 10 reps - Backward Walking with Counter  Support  - 1 x daily - 7 x weekly - 3 sets - 10 reps - Side Stepping with Resistance at Ankles and Counter Support  - 1 x daily - 7 x weekly - 3 sets - 10 reps - Forward Monster Walk with Resistance at Ankles and Counter Support  - 1 x daily - 7 x weekly - 3 sets - 10 reps - Standing Single Leg Stance with Counter Support  - 1 x daily - 7 x weekly - 1 sets - 5 reps - 30 hold - Sit to Stand on Foam Pad  - 1 x daily - 7 x weekly - 3 sets - 10 reps - Walking Step Over  - 1 x daily - 7 x weekly - 3 sets - 10 reps   GOALS: Goals reviewed with patient? Yes  SHORT TERM GOALS: Target date: 02/21/2022  Pt will be independent with initial HEP for improved strength, balance, transfers and gait. Baseline: Goal status: INITIAL  2.  Pt will improve gait velocity to at least 2.5 ft/sec for improved gait efficiency and performance at mod I level  Baseline: 2.1 ft/sec with RW (10/30) Goal status: INITIAL  3.  Pt will improve Berg score to 37/56 for decreased fall risk Baseline: 28/56 (10/30) Goal status: INITIAL  4.  6MWT to be assessed and LTG set Baseline:  Goal status: INITIAL   LONG TERM GOALS: Target date: 03/21/2022  Pt will be independent with final HEP for improved strength, balance, transfers and gait. Baseline:  Goal status: INITIAL  2.  Pt will improve gait velocity to at least 3.0 ft/sec for improved gait efficiency and performance at  mod I level  Baseline:  Goal status: INITIAL  3.  Pt will improve Berg score to 46/56 for decreased fall risk Baseline: 28/56 (10/30) Goal status: INITIAL  4.  6MWT to be assessed and LTG set Baseline:  Goal status: INITIAL   ASSESSMENT:  CLINICAL IMPRESSION: Pt performed 6 min walk test, 935' with RW , no rest breaks needed.  Progressed gait training emphasizing heel toe pattern, pt able to perform in clinic but questionable carryover out side of session.  Current HEP continues to challenge his balance and addresses  strengthening.   OBJECTIVE IMPAIRMENTS: decreased balance, decreased coordination, decreased endurance, difficulty walking, decreased strength, and decreased safety awareness.   ACTIVITY LIMITATIONS: carrying, lifting, bending, squatting, stairs, and transfers  PARTICIPATION LIMITATIONS: interpersonal relationship, driving, community activity, and church  PERSONAL FACTORS: Age and 1-2 comorbidities:     arthritis, trigeminal neuralgia, acute on chronic intracranial subdural hematoma, skin fibroxanthoma, possible seizure disorder are also affecting patient's functional outcome.   REHAB POTENTIAL: Good  CLINICAL DECISION MAKING: Stable/uncomplicated  EVALUATION COMPLEXITY: Moderate  PLAN:  PT FREQUENCY: 2x/week  PT DURATION: 10 weeks (17 total visits)  PLANNED INTERVENTIONS: Therapeutic exercises, Therapeutic activity, Neuromuscular re-education, Balance training, Gait training, Patient/Family education, Self Care, Joint mobilization, Stair training, Vestibular training, Canalith repositioning, Visual/preceptual remediation/compensation, Orthotic/Fit training, DME instructions, Aquatic Therapy, Dry Needling, Cognitive remediation, Electrical stimulation, Cryotherapy, Moist heat, Taping, Manual therapy, and Re-evaluation  PLAN FOR NEXT SESSION: review prior HEP ([id number]) and initiate new HEP along with formal walking program; balance based on BBS deficits (EC, tandem stance, turning, SLS), assess 6MWT and write LTG   Bjorn Loser, PTA  01/31/22, 12:15 PM

## 2022-02-02 ENCOUNTER — Ambulatory Visit: Payer: PPO | Admitting: Occupational Therapy

## 2022-02-02 NOTE — Therapy (Incomplete)
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  Rationale for Evaluation and Treatment: {HABREHAB:27488}  SUBJECTIVE:   SUBJECTIVE STATEMENT: *** Pt accompanied by: {accompnied:27141}  PERTINENT HISTORY: ***  PRECAUTIONS: {Therapy precautions:24002}  WEIGHT BEARING RESTRICTIONS: {Yes ***/No:24003}  PAIN:  Are you having pain? {OPRCPAIN:27236}  FALLS: Has patient fallen in last 6 months? {fallsyesno:27318}  LIVING ENVIRONMENT: Lives with: {[facility] lives with:25569::"lives with their family"} Lives in: {Lives in:25570} Stairs: {opstairs:27293} Has following equipment at home: {Assistive devices:23999}  PLOF: {PLOF:24004}  PATIENT GOALS: ***  OBJECTIVE:   HAND DOMINANCE: {MISC; OT HAND [phone]}  ADLs: Overall ADLs: *** Transfers/ambulation related to ADLs: Eating: *** Grooming: *** UB Dressing: *** LB Dressing: *** Toileting: *** Bathing: *** Tub Shower transfers: *** Equipment:  {equipment:25573}  IADLs: Shopping: *** Light housekeeping: *** Meal Prep: *** Community mobility: *** Medication management: *** Financial management: *** Handwriting: {OTWRITTENEXPRESSION:25361}  MOBILITY STATUS: {OTMOBILITY:25360}  POSTURE COMMENTS:  {posture:25561} Sitting balance: {sitting balance:25483}  ACTIVITY TOLERANCE: Activity tolerance: ***  FUNCTIONAL OUTCOME MEASURES: {OTFUNCTIONALMEASURES:27238}  UPPER EXTREMITY ROM:    {AROM/PROM:27142} ROM Right eval Left eval  Shoulder flexion    Shoulder abduction    Shoulder adduction    Shoulder extension    Shoulder internal rotation    Shoulder external rotation    Elbow flexion    Elbow extension    Wrist flexion    Wrist extension    Wrist ulnar deviation    Wrist radial deviation    Wrist pronation    Wrist supination    (Blank rows = not tested)  UPPER EXTREMITY MMT:     MMT Right eval Left eval  Shoulder flexion    Shoulder abduction    Shoulder adduction    Shoulder extension    Shoulder internal rotation    Shoulder external rotation    Middle trapezius    Lower trapezius    Elbow flexion    Elbow extension    Wrist flexion    Wrist extension    Wrist ulnar deviation    Wrist radial deviation    Wrist pronation    Wrist supination    (Blank rows = not tested)  HAND FUNCTION: {handfunction:27230}  COORDINATION: {otcoordination:27237}  SENSATION: {sensation:27233}  EDEMA: ***  MUSCLE TONE: {UETONE:25567}  COGNITION: Overall cognitive status: {cognition:24006}  VISION: Subjective report: *** Baseline vision: {OTBASELINEVISION:25363} Visual history: {OTVISUALHISTORY:25364}  VISION ASSESSMENT: {visionassessment:27231}  Patient has difficulty with following activities due to following visual impairments: ***  PERCEPTION: {Perception:25564}  PRAXIS: {  Praxis:25565}  OBSERVATIONS: ***   TODAY'S TREATMENT:                                                                                                                               DATE: ***   PATIENT EDUCATION: Education details: *** Person educated: {Person educated:25204} Education method: {Education Method:25205} Education comprehension: {Education Comprehension:25206}  HOME EXERCISE PROGRAM: ***   GOALS: Goals reviewed with patient? {yes/no:20286}  SHORT TERM GOALS: Target date: ***  *** Baseline: Goal status: {GOALSTATUS:25110}  2.  *** Baseline:  Goal status: {GOALSTATUS:25110}  3.  *** Baseline:  Goal status: {GOALSTATUS:25110}  4.  *** Baseline:  Goal status: {GOALSTATUS:25110}  5.  *** Baseline:  Goal status: {GOALSTATUS:25110}  6.  *** Baseline:  Goal status: {GOALSTATUS:25110}  LONG TERM GOALS: Target date: ***  *** Baseline:  Goal status: {GOALSTATUS:25110}  2.  *** Baseline:  Goal status: {GOALSTATUS:25110}  3.  *** Baseline:  Goal status: {GOALSTATUS:25110}  4.  *** Baseline:  Goal status: {GOALSTATUS:25110}  5.  *** Baseline:  Goal status: {GOALSTATUS:25110}  6.  *** Baseline:  Goal status: {GOALSTATUS:25110}  ASSESSMENT:  CLINICAL IMPRESSION: Patient is a *** y.o. *** who was seen today for occupational therapy evaluation for ***.   PERFORMANCE DEFICITS: in functional skills including {OT physical skills:25468}, cognitive skills including {OT cognitive skills:25469}, and psychosocial skills including {OT psychosocial skills:25470}.   IMPAIRMENTS: are limiting patient from {OT performance deficits:25471}.   CO-MORBIDITIES: {Comorbidities:25485} that affects occupational performance. Patient will benefit from skilled OT to address above impairments and improve overall function.  MODIFICATION OR ASSISTANCE TO COMPLETE EVALUATION: {OT modification:25474}  OT OCCUPATIONAL PROFILE AND HISTORY: {OT PROFILE AND HISTORY:25484}  CLINICAL DECISION MAKING: {OT CDM:25475}  REHAB POTENTIAL: {rehabpotential:25112}  EVALUATION COMPLEXITY:  {Evaluation complexity:25115}    PLAN:  OT FREQUENCY: {rehab frequency:25116}  OT DURATION: {rehab duration:25117}  PLANNED INTERVENTIONS: {OT Interventions:25467}  RECOMMENDED OTHER SERVICES: ***  CONSULTED AND AGREED WITH PLAN OF CARE: {FEO:71219}  PLAN FOR NEXT SESSION: Dennis Bast, OT 02/02/2022, 1:59 PM

## 2022-02-03 ENCOUNTER — Ambulatory Visit: Payer: PPO | Admitting: Physical Therapy

## 2022-02-04 DIAGNOSIS — J309 Allergic rhinitis, unspecified: Secondary | ICD-10-CM | POA: Diagnosis not present

## 2022-02-04 DIAGNOSIS — Z794 Long term (current) use of insulin: Secondary | ICD-10-CM | POA: Diagnosis not present

## 2022-02-04 DIAGNOSIS — E1165 Type 2 diabetes mellitus with hyperglycemia: Secondary | ICD-10-CM | POA: Diagnosis not present

## 2022-02-04 DIAGNOSIS — C7931 Secondary malignant neoplasm of brain: Secondary | ICD-10-CM | POA: Diagnosis not present

## 2022-02-04 DIAGNOSIS — Z7952 Long term (current) use of systemic steroids: Secondary | ICD-10-CM | POA: Diagnosis not present

## 2022-02-04 DIAGNOSIS — B379 Candidiasis, unspecified: Secondary | ICD-10-CM | POA: Diagnosis not present

## 2022-02-04 DIAGNOSIS — Z823 Family history of stroke: Secondary | ICD-10-CM | POA: Diagnosis not present

## 2022-02-04 DIAGNOSIS — B353 Tinea pedis: Secondary | ICD-10-CM | POA: Diagnosis not present

## 2022-02-04 DIAGNOSIS — Z803 Family history of malignant neoplasm of breast: Secondary | ICD-10-CM | POA: Diagnosis not present

## 2022-02-04 DIAGNOSIS — T50995A Adverse effect of other drugs, medicaments and biological substances, initial encounter: Secondary | ICD-10-CM | POA: Diagnosis not present

## 2022-02-04 DIAGNOSIS — T380X5A Adverse effect of glucocorticoids and synthetic analogues, initial encounter: Secondary | ICD-10-CM | POA: Diagnosis not present

## 2022-02-04 DIAGNOSIS — G40909 Epilepsy, unspecified, not intractable, without status epilepticus: Secondary | ICD-10-CM | POA: Diagnosis not present

## 2022-02-04 DIAGNOSIS — J704 Drug-induced interstitial lung disorders, unspecified: Secondary | ICD-10-CM | POA: Diagnosis not present

## 2022-02-04 DIAGNOSIS — E43 Unspecified severe protein-calorie malnutrition: Secondary | ICD-10-CM | POA: Diagnosis not present

## 2022-02-04 DIAGNOSIS — J984 Other disorders of lung: Secondary | ICD-10-CM | POA: Diagnosis not present

## 2022-02-04 DIAGNOSIS — B370 Candidal stomatitis: Secondary | ICD-10-CM | POA: Diagnosis not present

## 2022-02-04 DIAGNOSIS — R531 Weakness: Secondary | ICD-10-CM | POA: Diagnosis not present

## 2022-02-04 DIAGNOSIS — I69398 Other sequelae of cerebral infarction: Secondary | ICD-10-CM | POA: Diagnosis not present

## 2022-02-04 DIAGNOSIS — E0965 Drug or chemical induced diabetes mellitus with hyperglycemia: Secondary | ICD-10-CM | POA: Diagnosis not present

## 2022-02-04 DIAGNOSIS — G936 Cerebral edema: Secondary | ICD-10-CM | POA: Diagnosis not present

## 2022-02-04 DIAGNOSIS — L89819 Pressure ulcer of head, unspecified stage: Secondary | ICD-10-CM | POA: Diagnosis not present

## 2022-02-04 DIAGNOSIS — T368X5A Adverse effect of other systemic antibiotics, initial encounter: Secondary | ICD-10-CM | POA: Diagnosis not present

## 2022-02-04 DIAGNOSIS — Z923 Personal history of irradiation: Secondary | ICD-10-CM | POA: Diagnosis not present

## 2022-02-04 DIAGNOSIS — J8489 Other specified interstitial pulmonary diseases: Secondary | ICD-10-CM | POA: Diagnosis not present

## 2022-02-04 DIAGNOSIS — Z6823 Body mass index (BMI) 23.0-23.9, adult: Secondary | ICD-10-CM | POA: Diagnosis not present

## 2022-02-04 DIAGNOSIS — E099 Drug or chemical induced diabetes mellitus without complications: Secondary | ICD-10-CM | POA: Diagnosis not present

## 2022-02-04 DIAGNOSIS — Z8249 Family history of ischemic heart disease and other diseases of the circulatory system: Secondary | ICD-10-CM | POA: Diagnosis not present

## 2022-02-04 DIAGNOSIS — D6959 Other secondary thrombocytopenia: Secondary | ICD-10-CM | POA: Diagnosis not present

## 2022-02-04 DIAGNOSIS — Z833 Family history of diabetes mellitus: Secondary | ICD-10-CM | POA: Diagnosis not present

## 2022-02-04 DIAGNOSIS — Z79899 Other long term (current) drug therapy: Secondary | ICD-10-CM | POA: Diagnosis not present

## 2022-02-04 DIAGNOSIS — T426X5A Adverse effect of other antiepileptic and sedative-hypnotic drugs, initial encounter: Secondary | ICD-10-CM | POA: Diagnosis not present

## 2022-02-04 DIAGNOSIS — G253 Myoclonus: Secondary | ICD-10-CM | POA: Diagnosis not present

## 2022-02-04 DIAGNOSIS — C490 Malignant neoplasm of connective and soft tissue of head, face and neck: Secondary | ICD-10-CM | POA: Diagnosis not present

## 2022-02-04 DIAGNOSIS — Z825 Family history of asthma and other chronic lower respiratory diseases: Secondary | ICD-10-CM | POA: Diagnosis not present

## 2022-02-04 DIAGNOSIS — T380X5D Adverse effect of glucocorticoids and synthetic analogues, subsequent encounter: Secondary | ICD-10-CM | POA: Diagnosis not present

## 2022-02-04 DIAGNOSIS — R5381 Other malaise: Secondary | ICD-10-CM | POA: Diagnosis not present

## 2022-02-04 DIAGNOSIS — D696 Thrombocytopenia, unspecified: Secondary | ICD-10-CM | POA: Diagnosis not present

## 2022-02-04 DIAGNOSIS — M6281 Muscle weakness (generalized): Secondary | ICD-10-CM | POA: Diagnosis not present

## 2022-02-04 DIAGNOSIS — Z7982 Long term (current) use of aspirin: Secondary | ICD-10-CM | POA: Diagnosis not present

## 2022-02-04 DIAGNOSIS — D210 Benign neoplasm of connective and other soft tissue of head, face and neck: Secondary | ICD-10-CM | POA: Diagnosis not present

## 2022-02-04 DIAGNOSIS — G40109 Localization-related (focal) (partial) symptomatic epilepsy and epileptic syndromes with simple partial seizures, not intractable, without status epilepticus: Secondary | ICD-10-CM | POA: Diagnosis not present

## 2022-02-04 DIAGNOSIS — R569 Unspecified convulsions: Secondary | ICD-10-CM | POA: Diagnosis not present

## 2022-02-04 DIAGNOSIS — H409 Unspecified glaucoma: Secondary | ICD-10-CM | POA: Diagnosis not present

## 2022-02-04 DIAGNOSIS — L98499 Non-pressure chronic ulcer of skin of other sites with unspecified severity: Secondary | ICD-10-CM | POA: Diagnosis not present

## 2022-02-07 ENCOUNTER — Ambulatory Visit: Payer: PPO | Admitting: Physical Therapy

## 2022-02-10 ENCOUNTER — Ambulatory Visit: Payer: PPO | Admitting: Occupational Therapy

## 2022-02-10 ENCOUNTER — Ambulatory Visit: Payer: PPO | Admitting: Physical Therapy

## 2022-02-10 NOTE — Therapy (Deleted)
OUTPATIENT OCCUPATIONAL THERAPY [facility]  Patient Name: Hunter Chang MRN: [id number] DOB:[birth year], 76 y.o., male Today's Date: 02/10/2022  PCP: *** REFERRING PROVIDER: ***  END OF SESSION:   Past Medical History:  Diagnosis Date   Arthritis    Chicken pox    German measles    Trigeminal neuralgia    Past Surgical History:  Procedure Laterality Date   HERNIA REPAIR     3 times   Patient Active Problem List   Diagnosis Date Noted   Cellulitis of right foot 12/20/2021   Glaucoma 10/23/2021   Fever 10/23/2021   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HCC) 10/08/2021   Acute encephalopathy 10/08/2021   Left-sided weakness 10/08/2021   Seizures ([place]) 10/07/2021   Transient weakness of left lower extremity 09/29/2021   Fatigue 09/29/2021   Decreased libido 09/29/2021   Erectile dysfunction 04/28/2021   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  Rationale for Evaluation and Treatment: {HABREHAB:27488}  SUBJECTIVE:   SUBJECTIVE STATEMENT: *** Pt accompanied by: {accompnied:27141}  PERTINENT HISTORY: ***  PRECAUTIONS: {Therapy precautions:24002}  WEIGHT BEARING RESTRICTIONS: {Yes ***/No:24003}  PAIN:  Are you having pain? {OPRCPAIN:27236}  FALLS: Has patient fallen in last 6 months? {fallsyesno:27318}  LIVING ENVIRONMENT: Lives with: {OPRC lives with:25569::"lives with their family"} Lives in: {Lives in:25570} Stairs: {opstairs:27293} Has following equipment at home: {Assistive devices:23999}  PLOF: {PLOF:24004}  PATIENT GOALS: ***  OBJECTIVE:   HAND DOMINANCE: {MISC; OT HAND DOMINANCE:[phone]}  ADLs: Overall ADLs: *** Transfers/ambulation related to ADLs: Eating: *** Grooming: *** UB Dressing: *** LB Dressing: *** Toileting: *** Bathing: *** Tub Shower transfers:  *** Equipment: {equipment:25573}  IADLs: Shopping: *** Light housekeeping: *** Meal Prep: *** Community mobility: *** Medication management: *** Financial management: *** Handwriting: {OTWRITTENEXPRESSION:25361}  MOBILITY STATUS: {OTMOBILITY:25360}  POSTURE COMMENTS:  {posture:25561}  ACTIVITY TOLERANCE: Activity tolerance: ***  FUNCTIONAL OUTCOME MEASURES: {PDoutcomemeasures:27287}  COORDINATION: {otcoordination:27237}  UE ROM:  {PDMEASUREMENT:27288}  UE MMT:   {PDMEASUREMENT:27288}  SENSATION: {sensation:27233}  MUSCLE TONE: {UETONE:25567}  COGNITION: Overall cognitive status: {cognition:24006}  OBSERVATIONS: {PDobservations:27291}   TODAY'S TREATMENT:                                                                                                                              DATE: ***    PATIENT EDUCATION: Education details: *** Person educated: {Person educated:25204} Education method: {Education Method:25205} Education comprehension: {Education Comprehension:25206}  HOME EXERCISE PROGRAM: ***  GOALS: Goals reviewed with patient? {yes/no:20286}  SHORT TERM GOALS: Target date: ***  *** Baseline: Goal status: {GOALSTATUS:25110}  2.  *** Baseline:  Goal status: {GOALSTATUS:25110}  3.  *** Baseline:  Goal status: {GOALSTATUS:25110}  4.  *** Baseline:  Goal status: {GOALSTATUS:25110}  5.  *** Baseline:  Goal status: {GOALSTATUS:25110}  6.  *** Baseline:  Goal status: {GOALSTATUS:25110}  LONG  TERM GOALS: Target date: ***  *** Baseline:  Goal status: {GOALSTATUS:25110}  2.  *** Baseline:  Goal status: {GOALSTATUS:25110}  3.  *** Baseline:  Goal status: {GOALSTATUS:25110}  4.  *** Baseline:  Goal status: {GOALSTATUS:25110}  5.  *** Baseline:  Goal status: {GOALSTATUS:25110}  6.  *** Baseline:  Goal status: {GOALSTATUS:25110} ASSESSMENT:  CLINICAL IMPRESSION: Patient is a *** y.o. *** who was seen today for  occupational therapy evaluation for ***.   PERFORMANCE DEFICITS: in functional skills including {OT physical skills:25468}, cognitive skills including {OT cognitive skills:25469}, and psychosocial skills including {OT psychosocial skills:25470}.   IMPAIRMENTS: are limiting patient from {OT performance deficits:25471}.   COMORBIDITIES:  {Comorbidities:25485} that affects occupational performance. Patient will benefit from skilled OT to address above impairments and improve overall function.  MODIFICATION OR ASSISTANCE TO COMPLETE EVALUATION: {OT modification:25474}  OT OCCUPATIONAL PROFILE AND HISTORY: {OT PROFILE AND HISTORY:25484}  CLINICAL DECISION MAKING: {OT CDM:25475}  REHAB POTENTIAL: {rehabpotential:25112}  EVALUATION COMPLEXITY: {Evaluation complexity:25115}    PLAN:  OT FREQUENCY: {rehab frequency:25116}  OT DURATION: {rehab duration:25117}  PLANNED INTERVENTIONS: {OT Interventions:25467}  RECOMMENDED OTHER SERVICES: ***  CONSULTED AND AGREED WITH PLAN OF CARE: {MDY:70929}  PLAN FOR NEXT SESSION: ***   Theone Murdoch, OT 02/10/2022, 8:58 AM

## 2022-02-14 ENCOUNTER — Ambulatory Visit: Payer: PPO | Admitting: Physical Therapy

## 2022-02-16 DIAGNOSIS — J984 Other disorders of lung: Secondary | ICD-10-CM | POA: Diagnosis not present

## 2022-02-16 DIAGNOSIS — J309 Allergic rhinitis, unspecified: Secondary | ICD-10-CM | POA: Diagnosis not present

## 2022-02-16 DIAGNOSIS — L821 Other seborrheic keratosis: Secondary | ICD-10-CM | POA: Diagnosis not present

## 2022-02-16 DIAGNOSIS — L89819 Pressure ulcer of head, unspecified stage: Secondary | ICD-10-CM | POA: Diagnosis not present

## 2022-02-16 DIAGNOSIS — D1801 Hemangioma of skin and subcutaneous tissue: Secondary | ICD-10-CM | POA: Diagnosis not present

## 2022-02-16 DIAGNOSIS — Z7982 Long term (current) use of aspirin: Secondary | ICD-10-CM | POA: Diagnosis not present

## 2022-02-16 DIAGNOSIS — L812 Freckles: Secondary | ICD-10-CM | POA: Diagnosis not present

## 2022-02-16 DIAGNOSIS — D649 Anemia, unspecified: Secondary | ICD-10-CM | POA: Diagnosis not present

## 2022-02-16 DIAGNOSIS — J704 Drug-induced interstitial lung disorders, unspecified: Secondary | ICD-10-CM | POA: Diagnosis not present

## 2022-02-16 DIAGNOSIS — S81812A Laceration without foreign body, left lower leg, initial encounter: Secondary | ICD-10-CM | POA: Diagnosis not present

## 2022-02-16 DIAGNOSIS — B353 Tinea pedis: Secondary | ICD-10-CM | POA: Diagnosis not present

## 2022-02-16 DIAGNOSIS — Z794 Long term (current) use of insulin: Secondary | ICD-10-CM | POA: Diagnosis not present

## 2022-02-16 DIAGNOSIS — H409 Unspecified glaucoma: Secondary | ICD-10-CM | POA: Diagnosis not present

## 2022-02-16 DIAGNOSIS — R569 Unspecified convulsions: Secondary | ICD-10-CM | POA: Diagnosis not present

## 2022-02-16 DIAGNOSIS — D6959 Other secondary thrombocytopenia: Secondary | ICD-10-CM | POA: Diagnosis not present

## 2022-02-16 DIAGNOSIS — Z7952 Long term (current) use of systemic steroids: Secondary | ICD-10-CM | POA: Diagnosis not present

## 2022-02-16 DIAGNOSIS — R5381 Other malaise: Secondary | ICD-10-CM | POA: Diagnosis not present

## 2022-02-16 DIAGNOSIS — R600 Localized edema: Secondary | ICD-10-CM | POA: Diagnosis not present

## 2022-02-16 DIAGNOSIS — M6281 Muscle weakness (generalized): Secondary | ICD-10-CM | POA: Diagnosis not present

## 2022-02-16 DIAGNOSIS — E1165 Type 2 diabetes mellitus with hyperglycemia: Secondary | ICD-10-CM | POA: Diagnosis not present

## 2022-02-16 DIAGNOSIS — L245 Irritant contact dermatitis due to other chemical products: Secondary | ICD-10-CM | POA: Diagnosis not present

## 2022-02-16 DIAGNOSIS — G40109 Localization-related (focal) (partial) symptomatic epilepsy and epileptic syndromes with simple partial seizures, not intractable, without status epilepticus: Secondary | ICD-10-CM | POA: Diagnosis not present

## 2022-02-16 DIAGNOSIS — D210 Benign neoplasm of connective and other soft tissue of head, face and neck: Secondary | ICD-10-CM | POA: Diagnosis not present

## 2022-02-16 DIAGNOSIS — T380X5A Adverse effect of glucocorticoids and synthetic analogues, initial encounter: Secondary | ICD-10-CM | POA: Diagnosis not present

## 2022-02-16 DIAGNOSIS — Z79899 Other long term (current) drug therapy: Secondary | ICD-10-CM | POA: Diagnosis not present

## 2022-02-16 DIAGNOSIS — B379 Candidiasis, unspecified: Secondary | ICD-10-CM | POA: Diagnosis not present

## 2022-02-16 DIAGNOSIS — D692 Other nonthrombocytopenic purpura: Secondary | ICD-10-CM | POA: Diagnosis not present

## 2022-02-16 DIAGNOSIS — E099 Drug or chemical induced diabetes mellitus without complications: Secondary | ICD-10-CM | POA: Diagnosis not present

## 2022-02-16 DIAGNOSIS — R531 Weakness: Secondary | ICD-10-CM | POA: Diagnosis not present

## 2022-02-16 DIAGNOSIS — G40909 Epilepsy, unspecified, not intractable, without status epilepticus: Secondary | ICD-10-CM | POA: Diagnosis not present

## 2022-02-16 DIAGNOSIS — R296 Repeated falls: Secondary | ICD-10-CM | POA: Diagnosis not present

## 2022-02-16 DIAGNOSIS — Z85828 Personal history of other malignant neoplasm of skin: Secondary | ICD-10-CM | POA: Diagnosis not present

## 2022-02-16 DIAGNOSIS — D485 Neoplasm of uncertain behavior of skin: Secondary | ICD-10-CM | POA: Diagnosis not present

## 2022-02-16 DIAGNOSIS — T426X5A Adverse effect of other antiepileptic and sedative-hypnotic drugs, initial encounter: Secondary | ICD-10-CM | POA: Diagnosis not present

## 2022-02-16 DIAGNOSIS — K219 Gastro-esophageal reflux disease without esophagitis: Secondary | ICD-10-CM | POA: Diagnosis not present

## 2022-02-16 DIAGNOSIS — I69398 Other sequelae of cerebral infarction: Secondary | ICD-10-CM | POA: Diagnosis not present

## 2022-02-16 DIAGNOSIS — T380X5D Adverse effect of glucocorticoids and synthetic analogues, subsequent encounter: Secondary | ICD-10-CM | POA: Diagnosis not present

## 2022-02-18 DIAGNOSIS — D485 Neoplasm of uncertain behavior of skin: Secondary | ICD-10-CM | POA: Diagnosis not present

## 2022-02-18 DIAGNOSIS — G40909 Epilepsy, unspecified, not intractable, without status epilepticus: Secondary | ICD-10-CM | POA: Diagnosis not present

## 2022-02-18 DIAGNOSIS — Z79899 Other long term (current) drug therapy: Secondary | ICD-10-CM | POA: Diagnosis not present

## 2022-02-18 DIAGNOSIS — E1165 Type 2 diabetes mellitus with hyperglycemia: Secondary | ICD-10-CM | POA: Diagnosis not present

## 2022-02-21 ENCOUNTER — Encounter: Payer: PPO | Admitting: Occupational Therapy

## 2022-02-21 ENCOUNTER — Ambulatory Visit: Payer: PPO | Admitting: Physical Therapy

## 2022-02-21 DIAGNOSIS — R600 Localized edema: Secondary | ICD-10-CM | POA: Diagnosis not present

## 2022-02-21 DIAGNOSIS — S81812A Laceration without foreign body, left lower leg, initial encounter: Secondary | ICD-10-CM | POA: Diagnosis not present

## 2022-02-21 DIAGNOSIS — R296 Repeated falls: Secondary | ICD-10-CM | POA: Diagnosis not present

## 2022-02-22 DIAGNOSIS — Z79899 Other long term (current) drug therapy: Secondary | ICD-10-CM | POA: Diagnosis not present

## 2022-02-22 DIAGNOSIS — G40109 Localization-related (focal) (partial) symptomatic epilepsy and epileptic syndromes with simple partial seizures, not intractable, without status epilepticus: Secondary | ICD-10-CM | POA: Diagnosis not present

## 2022-02-23 DIAGNOSIS — E1165 Type 2 diabetes mellitus with hyperglycemia: Secondary | ICD-10-CM | POA: Diagnosis not present

## 2022-02-23 DIAGNOSIS — Z794 Long term (current) use of insulin: Secondary | ICD-10-CM | POA: Diagnosis not present

## 2022-02-23 DIAGNOSIS — K219 Gastro-esophageal reflux disease without esophagitis: Secondary | ICD-10-CM | POA: Diagnosis not present

## 2022-02-23 DIAGNOSIS — H409 Unspecified glaucoma: Secondary | ICD-10-CM | POA: Diagnosis not present

## 2022-02-23 DIAGNOSIS — D485 Neoplasm of uncertain behavior of skin: Secondary | ICD-10-CM | POA: Diagnosis not present

## 2022-02-23 DIAGNOSIS — S81812A Laceration without foreign body, left lower leg, initial encounter: Secondary | ICD-10-CM | POA: Diagnosis not present

## 2022-02-23 DIAGNOSIS — B353 Tinea pedis: Secondary | ICD-10-CM | POA: Diagnosis not present

## 2022-02-23 DIAGNOSIS — D649 Anemia, unspecified: Secondary | ICD-10-CM | POA: Diagnosis not present

## 2022-02-23 DIAGNOSIS — J984 Other disorders of lung: Secondary | ICD-10-CM | POA: Diagnosis not present

## 2022-02-23 DIAGNOSIS — G40909 Epilepsy, unspecified, not intractable, without status epilepticus: Secondary | ICD-10-CM | POA: Diagnosis not present

## 2022-02-23 DIAGNOSIS — Z79899 Other long term (current) drug therapy: Secondary | ICD-10-CM | POA: Diagnosis not present

## 2022-02-23 DIAGNOSIS — R296 Repeated falls: Secondary | ICD-10-CM | POA: Diagnosis not present

## 2022-02-24 ENCOUNTER — Encounter: Payer: PPO | Admitting: Occupational Therapy

## 2022-02-24 ENCOUNTER — Ambulatory Visit: Payer: PPO | Admitting: Physical Therapy

## 2022-02-24 DIAGNOSIS — L821 Other seborrheic keratosis: Secondary | ICD-10-CM | POA: Diagnosis not present

## 2022-02-24 DIAGNOSIS — L812 Freckles: Secondary | ICD-10-CM | POA: Diagnosis not present

## 2022-02-24 DIAGNOSIS — Z85828 Personal history of other malignant neoplasm of skin: Secondary | ICD-10-CM | POA: Diagnosis not present

## 2022-02-24 DIAGNOSIS — D692 Other nonthrombocytopenic purpura: Secondary | ICD-10-CM | POA: Diagnosis not present

## 2022-02-24 DIAGNOSIS — L245 Irritant contact dermatitis due to other chemical products: Secondary | ICD-10-CM | POA: Diagnosis not present

## 2022-02-24 DIAGNOSIS — D1801 Hemangioma of skin and subcutaneous tissue: Secondary | ICD-10-CM | POA: Diagnosis not present

## 2022-02-24 DIAGNOSIS — B353 Tinea pedis: Secondary | ICD-10-CM | POA: Diagnosis not present

## 2022-02-28 ENCOUNTER — Encounter: Payer: PPO | Admitting: Occupational Therapy

## 2022-02-28 ENCOUNTER — Ambulatory Visit: Payer: PPO | Admitting: Physical Therapy

## 2022-03-03 ENCOUNTER — Encounter: Payer: PPO | Admitting: Occupational Therapy

## 2022-03-03 ENCOUNTER — Ambulatory Visit: Payer: PPO | Admitting: Physical Therapy

## 2022-03-03 DIAGNOSIS — Z794 Long term (current) use of insulin: Secondary | ICD-10-CM | POA: Diagnosis not present

## 2022-03-03 DIAGNOSIS — G40909 Epilepsy, unspecified, not intractable, without status epilepticus: Secondary | ICD-10-CM | POA: Diagnosis not present

## 2022-03-03 DIAGNOSIS — R296 Repeated falls: Secondary | ICD-10-CM | POA: Diagnosis not present

## 2022-03-03 DIAGNOSIS — S81812A Laceration without foreign body, left lower leg, initial encounter: Secondary | ICD-10-CM | POA: Diagnosis not present

## 2022-03-03 DIAGNOSIS — D649 Anemia, unspecified: Secondary | ICD-10-CM | POA: Diagnosis not present

## 2022-03-03 DIAGNOSIS — Z79899 Other long term (current) drug therapy: Secondary | ICD-10-CM | POA: Diagnosis not present

## 2022-03-03 DIAGNOSIS — H409 Unspecified glaucoma: Secondary | ICD-10-CM | POA: Diagnosis not present

## 2022-03-03 DIAGNOSIS — B353 Tinea pedis: Secondary | ICD-10-CM | POA: Diagnosis not present

## 2022-03-03 DIAGNOSIS — D485 Neoplasm of uncertain behavior of skin: Secondary | ICD-10-CM | POA: Diagnosis not present

## 2022-03-03 DIAGNOSIS — K219 Gastro-esophageal reflux disease without esophagitis: Secondary | ICD-10-CM | POA: Diagnosis not present

## 2022-03-03 DIAGNOSIS — E1165 Type 2 diabetes mellitus with hyperglycemia: Secondary | ICD-10-CM | POA: Diagnosis not present

## 2022-03-03 DIAGNOSIS — J984 Other disorders of lung: Secondary | ICD-10-CM | POA: Diagnosis not present

## 2022-03-07 ENCOUNTER — Encounter: Payer: PPO | Admitting: Occupational Therapy

## 2022-03-07 ENCOUNTER — Ambulatory Visit: Payer: PPO | Admitting: Physical Therapy

## 2022-03-08 DIAGNOSIS — K7689 Other specified diseases of liver: Secondary | ICD-10-CM | POA: Diagnosis not present

## 2022-03-08 DIAGNOSIS — S2242XD Multiple fractures of ribs, left side, subsequent encounter for fracture with routine healing: Secondary | ICD-10-CM | POA: Diagnosis not present

## 2022-03-08 DIAGNOSIS — R918 Other nonspecific abnormal finding of lung field: Secondary | ICD-10-CM | POA: Diagnosis not present

## 2022-03-08 DIAGNOSIS — Z9225 Personal history of immunosupression therapy: Secondary | ICD-10-CM | POA: Diagnosis not present

## 2022-03-08 DIAGNOSIS — C4490 Unspecified malignant neoplasm of skin, unspecified: Secondary | ICD-10-CM | POA: Diagnosis not present

## 2022-03-08 DIAGNOSIS — D485 Neoplasm of uncertain behavior of skin: Secondary | ICD-10-CM | POA: Diagnosis not present

## 2022-03-09 ENCOUNTER — Encounter: Payer: PPO | Admitting: Occupational Therapy

## 2022-03-09 ENCOUNTER — Ambulatory Visit: Payer: PPO | Admitting: Physical Therapy

## 2022-03-09 DIAGNOSIS — D649 Anemia, unspecified: Secondary | ICD-10-CM | POA: Diagnosis not present

## 2022-03-09 DIAGNOSIS — I69354 Hemiplegia and hemiparesis following cerebral infarction affecting left non-dominant side: Secondary | ICD-10-CM | POA: Diagnosis not present

## 2022-03-09 DIAGNOSIS — T8189XD Other complications of procedures, not elsewhere classified, subsequent encounter: Secondary | ICD-10-CM | POA: Diagnosis not present

## 2022-03-09 DIAGNOSIS — E099 Drug or chemical induced diabetes mellitus without complications: Secondary | ICD-10-CM | POA: Diagnosis not present

## 2022-03-09 DIAGNOSIS — Z7952 Long term (current) use of systemic steroids: Secondary | ICD-10-CM | POA: Diagnosis not present

## 2022-03-09 DIAGNOSIS — E039 Hypothyroidism, unspecified: Secondary | ICD-10-CM | POA: Diagnosis not present

## 2022-03-09 DIAGNOSIS — R600 Localized edema: Secondary | ICD-10-CM | POA: Diagnosis not present

## 2022-03-09 DIAGNOSIS — Z794 Long term (current) use of insulin: Secondary | ICD-10-CM | POA: Diagnosis not present

## 2022-03-09 DIAGNOSIS — Z9181 History of falling: Secondary | ICD-10-CM | POA: Diagnosis not present

## 2022-03-09 DIAGNOSIS — D210 Benign neoplasm of connective and other soft tissue of head, face and neck: Secondary | ICD-10-CM | POA: Diagnosis not present

## 2022-03-09 DIAGNOSIS — G40909 Epilepsy, unspecified, not intractable, without status epilepticus: Secondary | ICD-10-CM | POA: Diagnosis not present

## 2022-03-09 DIAGNOSIS — Z853 Personal history of malignant neoplasm of breast: Secondary | ICD-10-CM | POA: Diagnosis not present

## 2022-03-09 DIAGNOSIS — J309 Allergic rhinitis, unspecified: Secondary | ICD-10-CM | POA: Diagnosis not present

## 2022-03-09 DIAGNOSIS — Z8701 Personal history of pneumonia (recurrent): Secondary | ICD-10-CM | POA: Diagnosis not present

## 2022-03-09 DIAGNOSIS — H409 Unspecified glaucoma: Secondary | ICD-10-CM | POA: Diagnosis not present

## 2022-03-09 DIAGNOSIS — T380X5D Adverse effect of glucocorticoids and synthetic analogues, subsequent encounter: Secondary | ICD-10-CM | POA: Diagnosis not present

## 2022-03-09 DIAGNOSIS — F0280 Dementia in other diseases classified elsewhere without behavioral disturbance: Secondary | ICD-10-CM | POA: Diagnosis not present

## 2022-03-09 DIAGNOSIS — K219 Gastro-esophageal reflux disease without esophagitis: Secondary | ICD-10-CM | POA: Diagnosis not present

## 2022-03-09 DIAGNOSIS — E559 Vitamin D deficiency, unspecified: Secondary | ICD-10-CM | POA: Diagnosis not present

## 2022-03-09 NOTE — Progress Notes (Signed)
      Subjective:    Patient ID: Hunter Chang, male    DOB: Aug 16, 1945, 76 y.o.   MRN: [id number]     HPI Hunter Chang is here for follow up from skilled nursing rehab.  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  Was in rehab.  Doing better.    Following with [facility]  On keppra / zonegram for foal seizures  Diabetes  -   Medications and allergies reviewed with patient and updated if appropriate.  Current Outpatient Medications on File Prior to Visit  Medication Sig Dispense Refill   amoxicillin-clavulanate (AUGMENTIN) 875-125 MG tablet Take 1 tablet by mouth 2 (two) times daily. 20 tablet 0   divalproex (DEPAKOTE) 250 MG DR tablet Take 250 mg by mouth 3 (three) times daily.     latanoprost (XALATAN) 0.005 % ophthalmic solution Place 1 drop into both eyes at bedtime.     levETIRAcetam (KEPPRA) 500 MG tablet Take 1 tablet (500 mg total) by mouth 2 (two) times daily.     LORazepam (ATIVAN) 1 MG tablet Take 1 tablet (1 mg total) by mouth 3 (three) times daily as needed for up to 15 doses for seizure. 15 tablet 0   magnesium gluconate (MAGONATE) 500 MG tablet Take 500 mg by mouth daily.     Multiple Vitamins-Minerals (ONE-A-DAY MENS 50+ ADVANTAGE) TABS Take 1 tablet by mouth daily.     Multiple Vitamins-Minerals (ZINC PO) Take 50 mg by mouth daily.     omeprazole (PRILOSEC) 20 MG capsule Take 20 mg by mouth daily.     predniSONE (DELTASONE) 20 MG tablet Take 20 mg by mouth daily with breakfast.     VITAMIN D PO Take 1 tablet by mouth daily.     No current facility-administered medications on file prior to visit.     Review of Systems     Objective:  There were no vitals filed for this visit. BP Readings from Last 3 Encounters:  12/01/21 132/73  10/22/21 116/74  10/12/21 (!) 170/88   Wt Readings from Last 3 Encounters:  10/22/21 168 lb (76.2 kg)  10/11/21 171 lb 1.2 oz  (77.6 kg)  09/30/21 175 lb 11.2 oz (79.7 kg)   There is no height or weight on file to calculate BMI.    Physical Exam     Lab Results  Component Value Date   WBC 10.2 11/30/2021   HGB 13.0 11/30/2021   HCT 36.9 (L) 11/30/2021   PLT 286 11/30/2021   GLUCOSE 112 (H) 11/30/2021   CHOL 180 10/23/2020   TRIG 92.0 10/23/2020   HDL 44.80 10/23/2020   LDLCALC 117 (H) 10/23/2020   ALT 20 11/30/2021   AST 25 11/30/2021   NA 141 11/30/2021   K 3.6 11/30/2021   CL 105 11/30/2021   CREATININE 1.04 11/30/2021   BUN 16 11/30/2021   CO2 25 11/30/2021   TSH 0.52 09/29/2021   INR 1.0 10/07/2021     Assessment & Plan:    See Problem List for Assessment and Plan of chronic medical problems.

## 2022-03-09 NOTE — Patient Instructions (Signed)
      Blood work was ordered.   The lab is on the first floor.    Medications changes include :       A referral was ordered for XXX.     Someone will call you to schedule an appointment.    No follow-ups on file.  

## 2022-03-10 ENCOUNTER — Encounter: Payer: Self-pay | Admitting: Internal Medicine

## 2022-03-10 ENCOUNTER — Ambulatory Visit (INDEPENDENT_AMBULATORY_CARE_PROVIDER_SITE_OTHER): Payer: PPO | Admitting: Internal Medicine

## 2022-03-10 VITALS — BP 110/74 | HR 90 | Temp 98.0°F | Ht 68.0 in | Wt 162.0 lb

## 2022-03-10 DIAGNOSIS — Z794 Long term (current) use of insulin: Secondary | ICD-10-CM

## 2022-03-10 DIAGNOSIS — E119 Type 2 diabetes mellitus without complications: Secondary | ICD-10-CM | POA: Diagnosis not present

## 2022-03-10 DIAGNOSIS — R569 Unspecified convulsions: Secondary | ICD-10-CM

## 2022-03-10 MED ORDER — ZONISAMIDE 100 MG PO CAPS: 200.0000 mg | ORAL_CAPSULE | Freq: Every day | ORAL | Status: DC

## 2022-03-10 NOTE — Assessment & Plan Note (Signed)
Chronic On keppra and zonegran Management per [facility]

## 2022-03-10 NOTE — Assessment & Plan Note (Signed)
New Secondary to steroids  Being tapered off steroids and will likely be off insulin completely On lantus 10 units nightly On humalog 8 units prior to each meal unless glucose <70 - increase on SS based on glucose Discussed has steroid does decreases he will need less insulin Recommend keeping glucose > 100 - idealy is 100-150

## 2022-03-23 ENCOUNTER — Ambulatory Visit: Payer: PPO | Admitting: Physical Therapy

## 2022-03-23 ENCOUNTER — Encounter: Payer: PPO | Admitting: Occupational Therapy

## 2022-03-24 DIAGNOSIS — T8189XD Other complications of procedures, not elsewhere classified, subsequent encounter: Secondary | ICD-10-CM | POA: Diagnosis not present

## 2022-03-24 DIAGNOSIS — E559 Vitamin D deficiency, unspecified: Secondary | ICD-10-CM | POA: Diagnosis not present

## 2022-03-24 DIAGNOSIS — Z8701 Personal history of pneumonia (recurrent): Secondary | ICD-10-CM | POA: Diagnosis not present

## 2022-03-24 DIAGNOSIS — R600 Localized edema: Secondary | ICD-10-CM | POA: Diagnosis not present

## 2022-03-24 DIAGNOSIS — E099 Drug or chemical induced diabetes mellitus without complications: Secondary | ICD-10-CM | POA: Diagnosis not present

## 2022-03-24 DIAGNOSIS — D649 Anemia, unspecified: Secondary | ICD-10-CM | POA: Diagnosis not present

## 2022-03-24 DIAGNOSIS — G40909 Epilepsy, unspecified, not intractable, without status epilepticus: Secondary | ICD-10-CM | POA: Diagnosis not present

## 2022-03-24 DIAGNOSIS — F0280 Dementia in other diseases classified elsewhere without behavioral disturbance: Secondary | ICD-10-CM | POA: Diagnosis not present

## 2022-03-24 DIAGNOSIS — Z7952 Long term (current) use of systemic steroids: Secondary | ICD-10-CM | POA: Diagnosis not present

## 2022-03-24 DIAGNOSIS — J309 Allergic rhinitis, unspecified: Secondary | ICD-10-CM | POA: Diagnosis not present

## 2022-03-24 DIAGNOSIS — T380X5D Adverse effect of glucocorticoids and synthetic analogues, subsequent encounter: Secondary | ICD-10-CM | POA: Diagnosis not present

## 2022-03-24 DIAGNOSIS — Z794 Long term (current) use of insulin: Secondary | ICD-10-CM | POA: Diagnosis not present

## 2022-03-24 DIAGNOSIS — D210 Benign neoplasm of connective and other soft tissue of head, face and neck: Secondary | ICD-10-CM | POA: Diagnosis not present

## 2022-03-24 DIAGNOSIS — K219 Gastro-esophageal reflux disease without esophagitis: Secondary | ICD-10-CM | POA: Diagnosis not present

## 2022-03-24 DIAGNOSIS — H409 Unspecified glaucoma: Secondary | ICD-10-CM | POA: Diagnosis not present

## 2022-03-24 DIAGNOSIS — Z9181 History of falling: Secondary | ICD-10-CM | POA: Diagnosis not present

## 2022-03-24 DIAGNOSIS — I69354 Hemiplegia and hemiparesis following cerebral infarction affecting left non-dominant side: Secondary | ICD-10-CM | POA: Diagnosis not present

## 2022-03-24 DIAGNOSIS — E039 Hypothyroidism, unspecified: Secondary | ICD-10-CM | POA: Diagnosis not present

## 2022-03-24 DIAGNOSIS — Z853 Personal history of malignant neoplasm of breast: Secondary | ICD-10-CM | POA: Diagnosis not present

## 2022-03-25 ENCOUNTER — Encounter: Payer: PPO | Admitting: Occupational Therapy

## 2022-03-25 ENCOUNTER — Ambulatory Visit: Payer: PPO | Admitting: Physical Therapy

## 2022-03-29 ENCOUNTER — Encounter: Payer: PPO | Admitting: Occupational Therapy

## 2022-03-29 ENCOUNTER — Ambulatory Visit: Payer: PPO | Admitting: Physical Therapy

## 2022-03-30 ENCOUNTER — Encounter: Payer: Self-pay | Admitting: Internal Medicine

## 2022-03-30 DIAGNOSIS — R569 Unspecified convulsions: Secondary | ICD-10-CM

## 2022-03-30 DIAGNOSIS — R296 Repeated falls: Secondary | ICD-10-CM

## 2022-03-30 DIAGNOSIS — R531 Weakness: Secondary | ICD-10-CM

## 2022-03-31 ENCOUNTER — Encounter: Payer: PPO | Admitting: Occupational Therapy

## 2022-03-31 ENCOUNTER — Telehealth: Payer: Self-pay | Admitting: Internal Medicine

## 2022-03-31 ENCOUNTER — Ambulatory Visit: Payer: PPO | Admitting: Physical Therapy

## 2022-03-31 NOTE — Telephone Encounter (Signed)
Okay for orders? 

## 2022-03-31 NOTE — Telephone Encounter (Signed)
Verbals left on voicemail today for [employer].

## 2022-03-31 NOTE — Telephone Encounter (Signed)
[place]:  [place]   Phone Number: [phone]   Needs Verbal orders for what service & frequency:Home Health Physical Therapy - 1 x a week for 7 weeks

## 2022-04-01 ENCOUNTER — Telehealth: Payer: Self-pay

## 2022-04-01 NOTE — Telephone Encounter (Signed)
(  2:44 pm) PC SW talked with patient's wife regarding referral for palliative care services and offered to set up a visit. She advised that she did not want palliative care right now. SW provided education to her regarding palliative care services. She advised that at this time, she would decline services for her husband as they would like to focus on physical therapy for patient.

## 2022-04-02 DIAGNOSIS — Z9181 History of falling: Secondary | ICD-10-CM | POA: Diagnosis not present

## 2022-04-02 DIAGNOSIS — I69354 Hemiplegia and hemiparesis following cerebral infarction affecting left non-dominant side: Secondary | ICD-10-CM | POA: Diagnosis not present

## 2022-04-02 DIAGNOSIS — E039 Hypothyroidism, unspecified: Secondary | ICD-10-CM | POA: Diagnosis not present

## 2022-04-02 DIAGNOSIS — G40909 Epilepsy, unspecified, not intractable, without status epilepticus: Secondary | ICD-10-CM | POA: Diagnosis not present

## 2022-04-02 DIAGNOSIS — E099 Drug or chemical induced diabetes mellitus without complications: Secondary | ICD-10-CM | POA: Diagnosis not present

## 2022-04-02 DIAGNOSIS — T8189XD Other complications of procedures, not elsewhere classified, subsequent encounter: Secondary | ICD-10-CM | POA: Diagnosis not present

## 2022-04-02 DIAGNOSIS — D210 Benign neoplasm of connective and other soft tissue of head, face and neck: Secondary | ICD-10-CM | POA: Diagnosis not present

## 2022-04-02 DIAGNOSIS — E559 Vitamin D deficiency, unspecified: Secondary | ICD-10-CM | POA: Diagnosis not present

## 2022-04-02 DIAGNOSIS — R600 Localized edema: Secondary | ICD-10-CM | POA: Diagnosis not present

## 2022-04-02 DIAGNOSIS — K219 Gastro-esophageal reflux disease without esophagitis: Secondary | ICD-10-CM | POA: Diagnosis not present

## 2022-04-02 DIAGNOSIS — J309 Allergic rhinitis, unspecified: Secondary | ICD-10-CM | POA: Diagnosis not present

## 2022-04-02 DIAGNOSIS — H409 Unspecified glaucoma: Secondary | ICD-10-CM | POA: Diagnosis not present

## 2022-04-02 DIAGNOSIS — T380X5D Adverse effect of glucocorticoids and synthetic analogues, subsequent encounter: Secondary | ICD-10-CM | POA: Diagnosis not present

## 2022-04-02 DIAGNOSIS — Z7952 Long term (current) use of systemic steroids: Secondary | ICD-10-CM | POA: Diagnosis not present

## 2022-04-02 DIAGNOSIS — Z853 Personal history of malignant neoplasm of breast: Secondary | ICD-10-CM | POA: Diagnosis not present

## 2022-04-02 DIAGNOSIS — F0280 Dementia in other diseases classified elsewhere without behavioral disturbance: Secondary | ICD-10-CM | POA: Diagnosis not present

## 2022-04-02 DIAGNOSIS — Z794 Long term (current) use of insulin: Secondary | ICD-10-CM | POA: Diagnosis not present

## 2022-04-02 DIAGNOSIS — D649 Anemia, unspecified: Secondary | ICD-10-CM | POA: Diagnosis not present

## 2022-04-02 DIAGNOSIS — Z8701 Personal history of pneumonia (recurrent): Secondary | ICD-10-CM | POA: Diagnosis not present

## 2022-04-04 ENCOUNTER — Ambulatory Visit: Payer: PPO | Admitting: Physical Therapy

## 2022-04-04 ENCOUNTER — Encounter: Payer: PPO | Admitting: Occupational Therapy

## 2022-04-07 ENCOUNTER — Encounter: Payer: PPO | Admitting: Occupational Therapy

## 2022-04-07 ENCOUNTER — Ambulatory Visit: Payer: PPO | Admitting: Internal Medicine

## 2022-04-07 DIAGNOSIS — I69354 Hemiplegia and hemiparesis following cerebral infarction affecting left non-dominant side: Secondary | ICD-10-CM | POA: Diagnosis not present

## 2022-04-07 DIAGNOSIS — Z7952 Long term (current) use of systemic steroids: Secondary | ICD-10-CM

## 2022-04-07 DIAGNOSIS — Z794 Long term (current) use of insulin: Secondary | ICD-10-CM

## 2022-04-07 DIAGNOSIS — F0280 Dementia in other diseases classified elsewhere without behavioral disturbance: Secondary | ICD-10-CM | POA: Diagnosis not present

## 2022-04-07 DIAGNOSIS — E039 Hypothyroidism, unspecified: Secondary | ICD-10-CM | POA: Diagnosis not present

## 2022-04-07 DIAGNOSIS — H409 Unspecified glaucoma: Secondary | ICD-10-CM | POA: Diagnosis not present

## 2022-04-07 DIAGNOSIS — E099 Drug or chemical induced diabetes mellitus without complications: Secondary | ICD-10-CM | POA: Diagnosis not present

## 2022-04-07 DIAGNOSIS — Z8701 Personal history of pneumonia (recurrent): Secondary | ICD-10-CM

## 2022-04-07 DIAGNOSIS — D649 Anemia, unspecified: Secondary | ICD-10-CM | POA: Diagnosis not present

## 2022-04-07 DIAGNOSIS — R600 Localized edema: Secondary | ICD-10-CM | POA: Diagnosis not present

## 2022-04-07 DIAGNOSIS — D210 Benign neoplasm of connective and other soft tissue of head, face and neck: Secondary | ICD-10-CM | POA: Diagnosis not present

## 2022-04-07 DIAGNOSIS — Z9181 History of falling: Secondary | ICD-10-CM

## 2022-04-07 DIAGNOSIS — Z853 Personal history of malignant neoplasm of breast: Secondary | ICD-10-CM

## 2022-04-07 DIAGNOSIS — K219 Gastro-esophageal reflux disease without esophagitis: Secondary | ICD-10-CM | POA: Diagnosis not present

## 2022-04-07 DIAGNOSIS — E559 Vitamin D deficiency, unspecified: Secondary | ICD-10-CM | POA: Diagnosis not present

## 2022-04-07 DIAGNOSIS — J309 Allergic rhinitis, unspecified: Secondary | ICD-10-CM | POA: Diagnosis not present

## 2022-04-07 DIAGNOSIS — G40901 Epilepsy, unspecified, not intractable, with status epilepticus: Secondary | ICD-10-CM | POA: Diagnosis not present

## 2022-04-08 DIAGNOSIS — G252 Other specified forms of tremor: Secondary | ICD-10-CM | POA: Diagnosis not present

## 2022-04-08 DIAGNOSIS — G062 Extradural and subdural abscess, unspecified: Secondary | ICD-10-CM | POA: Diagnosis not present

## 2022-04-08 DIAGNOSIS — D219 Benign neoplasm of connective and other soft tissue, unspecified: Secondary | ICD-10-CM | POA: Diagnosis not present

## 2022-04-08 DIAGNOSIS — T380X5A Adverse effect of glucocorticoids and synthetic analogues, initial encounter: Secondary | ICD-10-CM | POA: Diagnosis not present

## 2022-04-08 DIAGNOSIS — R251 Tremor, unspecified: Secondary | ICD-10-CM | POA: Diagnosis not present

## 2022-04-08 DIAGNOSIS — Z7409 Other reduced mobility: Secondary | ICD-10-CM | POA: Diagnosis not present

## 2022-04-08 DIAGNOSIS — M869 Osteomyelitis, unspecified: Secondary | ICD-10-CM | POA: Diagnosis not present

## 2022-04-08 DIAGNOSIS — R509 Fever, unspecified: Secondary | ICD-10-CM | POA: Diagnosis not present

## 2022-04-08 DIAGNOSIS — Z888 Allergy status to other drugs, medicaments and biological substances status: Secondary | ICD-10-CM | POA: Diagnosis not present

## 2022-04-08 DIAGNOSIS — Z792 Long term (current) use of antibiotics: Secondary | ICD-10-CM | POA: Diagnosis not present

## 2022-04-08 DIAGNOSIS — K219 Gastro-esophageal reflux disease without esophagitis: Secondary | ICD-10-CM | POA: Diagnosis not present

## 2022-04-08 DIAGNOSIS — D332 Benign neoplasm of brain, unspecified: Secondary | ICD-10-CM | POA: Diagnosis not present

## 2022-04-08 DIAGNOSIS — Z833 Family history of diabetes mellitus: Secondary | ICD-10-CM | POA: Diagnosis not present

## 2022-04-08 DIAGNOSIS — Z8249 Family history of ischemic heart disease and other diseases of the circulatory system: Secondary | ICD-10-CM | POA: Diagnosis not present

## 2022-04-08 DIAGNOSIS — G40909 Epilepsy, unspecified, not intractable, without status epilepticus: Secondary | ICD-10-CM | POA: Diagnosis not present

## 2022-04-08 DIAGNOSIS — M868X8 Other osteomyelitis, other site: Secondary | ICD-10-CM | POA: Diagnosis not present

## 2022-04-08 DIAGNOSIS — S0100XD Unspecified open wound of scalp, subsequent encounter: Secondary | ICD-10-CM | POA: Diagnosis not present

## 2022-04-08 DIAGNOSIS — Z20822 Contact with and (suspected) exposure to covid-19: Secondary | ICD-10-CM | POA: Diagnosis not present

## 2022-04-08 DIAGNOSIS — E1169 Type 2 diabetes mellitus with other specified complication: Secondary | ICD-10-CM | POA: Diagnosis not present

## 2022-04-08 DIAGNOSIS — D496 Neoplasm of unspecified behavior of brain: Secondary | ICD-10-CM | POA: Diagnosis not present

## 2022-04-08 DIAGNOSIS — Z7982 Long term (current) use of aspirin: Secondary | ICD-10-CM | POA: Diagnosis not present

## 2022-04-08 DIAGNOSIS — E119 Type 2 diabetes mellitus without complications: Secondary | ICD-10-CM | POA: Diagnosis not present

## 2022-04-08 DIAGNOSIS — C7931 Secondary malignant neoplasm of brain: Secondary | ICD-10-CM | POA: Diagnosis not present

## 2022-04-08 DIAGNOSIS — Z923 Personal history of irradiation: Secondary | ICD-10-CM | POA: Diagnosis not present

## 2022-04-08 DIAGNOSIS — D4819 Other specified neoplasm of uncertain behavior of connective and other soft tissue: Secondary | ICD-10-CM | POA: Diagnosis not present

## 2022-04-08 DIAGNOSIS — G060 Intracranial abscess and granuloma: Secondary | ICD-10-CM | POA: Diagnosis not present

## 2022-04-08 DIAGNOSIS — G0490 Encephalitis and encephalomyelitis, unspecified: Secondary | ICD-10-CM | POA: Diagnosis not present

## 2022-04-08 DIAGNOSIS — S098XXA Other specified injuries of head, initial encounter: Secondary | ICD-10-CM | POA: Diagnosis not present

## 2022-04-08 DIAGNOSIS — C4440 Unspecified malignant neoplasm of skin of scalp and neck: Secondary | ICD-10-CM | POA: Diagnosis not present

## 2022-04-08 DIAGNOSIS — Z794 Long term (current) use of insulin: Secondary | ICD-10-CM | POA: Diagnosis not present

## 2022-04-08 DIAGNOSIS — G96198 Other disorders of meninges, not elsewhere classified: Secondary | ICD-10-CM | POA: Diagnosis not present

## 2022-04-08 DIAGNOSIS — Z823 Family history of stroke: Secondary | ICD-10-CM | POA: Diagnosis not present

## 2022-04-08 DIAGNOSIS — R930 Abnormal findings on diagnostic imaging of skull and head, not elsewhere classified: Secondary | ICD-10-CM | POA: Diagnosis not present

## 2022-04-08 DIAGNOSIS — Z820 Family history of epilepsy and other diseases of the nervous system: Secondary | ICD-10-CM | POA: Diagnosis not present

## 2022-04-08 DIAGNOSIS — D84821 Immunodeficiency due to drugs: Secondary | ICD-10-CM | POA: Diagnosis not present

## 2022-04-08 DIAGNOSIS — H409 Unspecified glaucoma: Secondary | ICD-10-CM | POA: Diagnosis not present

## 2022-04-08 DIAGNOSIS — Z9889 Other specified postprocedural states: Secondary | ICD-10-CM | POA: Diagnosis not present

## 2022-04-08 DIAGNOSIS — G936 Cerebral edema: Secondary | ICD-10-CM | POA: Diagnosis not present

## 2022-04-08 DIAGNOSIS — R569 Unspecified convulsions: Secondary | ICD-10-CM | POA: Diagnosis not present

## 2022-04-08 DIAGNOSIS — Z79899 Other long term (current) drug therapy: Secondary | ICD-10-CM | POA: Diagnosis not present

## 2022-04-08 DIAGNOSIS — D485 Neoplasm of uncertain behavior of skin: Secondary | ICD-10-CM | POA: Diagnosis not present

## 2022-04-11 ENCOUNTER — Encounter: Payer: PPO | Admitting: Occupational Therapy

## 2022-04-14 ENCOUNTER — Encounter: Payer: PPO | Admitting: Occupational Therapy

## 2022-04-15 ENCOUNTER — Other Ambulatory Visit: Payer: Self-pay

## 2022-04-15 ENCOUNTER — Other Ambulatory Visit: Payer: Self-pay | Admitting: Internal Medicine

## 2022-04-15 ENCOUNTER — Telehealth: Payer: Self-pay | Admitting: *Deleted

## 2022-04-15 ENCOUNTER — Encounter: Payer: Self-pay | Admitting: *Deleted

## 2022-04-15 DIAGNOSIS — F0280 Dementia in other diseases classified elsewhere without behavioral disturbance: Secondary | ICD-10-CM | POA: Diagnosis not present

## 2022-04-15 DIAGNOSIS — E559 Vitamin D deficiency, unspecified: Secondary | ICD-10-CM | POA: Diagnosis not present

## 2022-04-15 DIAGNOSIS — Z794 Long term (current) use of insulin: Secondary | ICD-10-CM | POA: Diagnosis not present

## 2022-04-15 DIAGNOSIS — H409 Unspecified glaucoma: Secondary | ICD-10-CM | POA: Diagnosis not present

## 2022-04-15 DIAGNOSIS — R600 Localized edema: Secondary | ICD-10-CM | POA: Diagnosis not present

## 2022-04-15 DIAGNOSIS — G40909 Epilepsy, unspecified, not intractable, without status epilepticus: Secondary | ICD-10-CM | POA: Diagnosis not present

## 2022-04-15 DIAGNOSIS — D210 Benign neoplasm of connective and other soft tissue of head, face and neck: Secondary | ICD-10-CM | POA: Diagnosis not present

## 2022-04-15 DIAGNOSIS — K219 Gastro-esophageal reflux disease without esophagitis: Secondary | ICD-10-CM | POA: Diagnosis not present

## 2022-04-15 DIAGNOSIS — T380X5D Adverse effect of glucocorticoids and synthetic analogues, subsequent encounter: Secondary | ICD-10-CM | POA: Diagnosis not present

## 2022-04-15 DIAGNOSIS — J309 Allergic rhinitis, unspecified: Secondary | ICD-10-CM | POA: Diagnosis not present

## 2022-04-15 DIAGNOSIS — Z853 Personal history of malignant neoplasm of breast: Secondary | ICD-10-CM | POA: Diagnosis not present

## 2022-04-15 DIAGNOSIS — E039 Hypothyroidism, unspecified: Secondary | ICD-10-CM | POA: Diagnosis not present

## 2022-04-15 DIAGNOSIS — I69354 Hemiplegia and hemiparesis following cerebral infarction affecting left non-dominant side: Secondary | ICD-10-CM | POA: Diagnosis not present

## 2022-04-15 DIAGNOSIS — E099 Drug or chemical induced diabetes mellitus without complications: Secondary | ICD-10-CM | POA: Diagnosis not present

## 2022-04-15 DIAGNOSIS — T8189XD Other complications of procedures, not elsewhere classified, subsequent encounter: Secondary | ICD-10-CM | POA: Diagnosis not present

## 2022-04-15 DIAGNOSIS — Z9181 History of falling: Secondary | ICD-10-CM | POA: Diagnosis not present

## 2022-04-15 DIAGNOSIS — Z7952 Long term (current) use of systemic steroids: Secondary | ICD-10-CM | POA: Diagnosis not present

## 2022-04-15 DIAGNOSIS — D649 Anemia, unspecified: Secondary | ICD-10-CM | POA: Diagnosis not present

## 2022-04-15 DIAGNOSIS — Z8701 Personal history of pneumonia (recurrent): Secondary | ICD-10-CM | POA: Diagnosis not present

## 2022-04-15 NOTE — Patient Outreach (Signed)
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 "I am doing great... I am followed regularly by the team at [facility], they are handling everything.... I have appointments all set up with [facility] and they did not tell me that I needed to see Dr. Quay Burow after this hospital visit since I am seeing all my specialists within a couple of weeks.  The IV team is coming here today and we are expecting to see them any time.  I have all of the medicine I need, taking like I am supposed to, and I can't review them with you now, because I am eating lunch before the nurse comes out.... she will review my medications when she gets here.  I am able to do everything on my own to care for myself, but my wife is here if I need anything, she can help me" Any questions or concerns? No  Items Reviewed: Did the pt receive and understand the discharge instructions provided? Yes  Medications obtained and verified? No  patient declined medication review Other? No  Any new allergies since your discharge? No  Dietary orders reviewed? Yes Do you have support at home? Yes  patient reports he is independent in self-care needs; wife assists as/ if indicated  Home Care and Equipment/Supplies: Were home health services ordered? yes If so, what is the name of the agency? [facility] services/ RN  Has the agency set up a time to come to the patient's home? Yes- to arrive "any time now" Were any new equipment or medical supplies ordered?  Yes: IV equipment for antibiotic infusion What is the name of the medical supply agency? "Not sure, but I have everything I need" Were you able to get the supplies/equipment? yes Do you have any questions related to the use of the equipment  or supplies? No  Functional Questionnaire: (I = Independent and D = Dependent) ADLs: I wife assists as/ if needed  Bathing/Dressing- I  wife assists as/ if needed  Meal Prep- I  wife assists as/ if needed  Eating- I  Maintaining continence- I  Transferring/Ambulation- I uses walker regularly per patient report  Managing Meds- I  wife assists as/ if needed  Follow up appointments reviewed:  PCP Hospital f/u appt confirmed? No  Scheduled to see - on - @ - patient reports hospital follow up visit with PCP was not recommended by hospital discharge team at [place]- patient followed closely by [facility] f/u appt confirmed? Yes  Scheduled to see ID specialist at [facility] on Thursday 04/28/22 @ 1:00 pm- verified from review of outside hospital record that this was recommended time frame for specialist follow up Are transportation arrangements needed? No  If their condition worsens, is the pt aware to call PCP or go to the Emergency Dept.? Yes Was the patient provided with contact information for the PCP's office or ED? No- patient declined, states already has contact information for all care providers Was to pt encouraged to call back with questions or concerns? Yes- patient declined ongoing care coordination follow stating he already has this from specialists at [facility] providers  SDOH assessments and interventions completed:   Yes SDOH Interventions Today    Flowsheet Row Most Recent Value  SDOH Interventions   Food  Insecurity Interventions Intervention Not Indicated  Transportation Interventions Intervention Not Indicated  [wife provides transportation]      Care Coordination Interventions:  No Care Coordination interventions needed at this time.   Encounter Outcome:  Pt. Visit Completed    Oneta Rack, RN, BSN, CCRN Alumnus RN CM Care Coordination/ Transition of [place] Management [phone]: direct office

## 2022-04-15 NOTE — Telephone Encounter (Signed)
Patient's wife would like to know if Dr. Quay Burow will do this today for her husband.  He only has enough medication to last through Saturday.

## 2022-04-18 ENCOUNTER — Telehealth: Payer: Self-pay | Admitting: Internal Medicine

## 2022-04-18 NOTE — Telephone Encounter (Signed)
Verbals given  

## 2022-04-18 NOTE — Telephone Encounter (Signed)
Okay for orders? 

## 2022-04-18 NOTE — Telephone Encounter (Signed)
Pam with [facility] called to report that patient was discharged from hospital and is on IV antibiotics through a PICC line. He needs nursing 1 time per week for 4 weeks with 2 PRN. Call back number is [phone]

## 2022-04-19 DIAGNOSIS — D210 Benign neoplasm of connective and other soft tissue of head, face and neck: Secondary | ICD-10-CM | POA: Diagnosis not present

## 2022-04-23 DIAGNOSIS — M869 Osteomyelitis, unspecified: Secondary | ICD-10-CM | POA: Diagnosis not present

## 2022-04-27 DIAGNOSIS — H409 Unspecified glaucoma: Secondary | ICD-10-CM | POA: Diagnosis not present

## 2022-04-27 DIAGNOSIS — E559 Vitamin D deficiency, unspecified: Secondary | ICD-10-CM | POA: Diagnosis not present

## 2022-04-27 DIAGNOSIS — D649 Anemia, unspecified: Secondary | ICD-10-CM | POA: Diagnosis not present

## 2022-04-27 DIAGNOSIS — E099 Drug or chemical induced diabetes mellitus without complications: Secondary | ICD-10-CM | POA: Diagnosis not present

## 2022-04-27 DIAGNOSIS — Z9181 History of falling: Secondary | ICD-10-CM | POA: Diagnosis not present

## 2022-04-27 DIAGNOSIS — E039 Hypothyroidism, unspecified: Secondary | ICD-10-CM | POA: Diagnosis not present

## 2022-04-27 DIAGNOSIS — T380X5D Adverse effect of glucocorticoids and synthetic analogues, subsequent encounter: Secondary | ICD-10-CM | POA: Diagnosis not present

## 2022-04-27 DIAGNOSIS — Z7952 Long term (current) use of systemic steroids: Secondary | ICD-10-CM | POA: Diagnosis not present

## 2022-04-27 DIAGNOSIS — K219 Gastro-esophageal reflux disease without esophagitis: Secondary | ICD-10-CM | POA: Diagnosis not present

## 2022-04-27 DIAGNOSIS — Z8701 Personal history of pneumonia (recurrent): Secondary | ICD-10-CM | POA: Diagnosis not present

## 2022-04-27 DIAGNOSIS — Z853 Personal history of malignant neoplasm of breast: Secondary | ICD-10-CM | POA: Diagnosis not present

## 2022-04-27 DIAGNOSIS — I69354 Hemiplegia and hemiparesis following cerebral infarction affecting left non-dominant side: Secondary | ICD-10-CM | POA: Diagnosis not present

## 2022-04-27 DIAGNOSIS — J309 Allergic rhinitis, unspecified: Secondary | ICD-10-CM | POA: Diagnosis not present

## 2022-04-27 DIAGNOSIS — D210 Benign neoplasm of connective and other soft tissue of head, face and neck: Secondary | ICD-10-CM | POA: Diagnosis not present

## 2022-04-27 DIAGNOSIS — F0280 Dementia in other diseases classified elsewhere without behavioral disturbance: Secondary | ICD-10-CM | POA: Diagnosis not present

## 2022-04-27 DIAGNOSIS — R600 Localized edema: Secondary | ICD-10-CM | POA: Diagnosis not present

## 2022-04-27 DIAGNOSIS — T8189XD Other complications of procedures, not elsewhere classified, subsequent encounter: Secondary | ICD-10-CM | POA: Diagnosis not present

## 2022-04-27 DIAGNOSIS — Z794 Long term (current) use of insulin: Secondary | ICD-10-CM | POA: Diagnosis not present

## 2022-04-27 DIAGNOSIS — G40909 Epilepsy, unspecified, not intractable, without status epilepticus: Secondary | ICD-10-CM | POA: Diagnosis not present

## 2022-04-28 DIAGNOSIS — G062 Extradural and subdural abscess, unspecified: Secondary | ICD-10-CM | POA: Diagnosis not present

## 2022-04-28 DIAGNOSIS — G0490 Encephalitis and encephalomyelitis, unspecified: Secondary | ICD-10-CM | POA: Diagnosis not present

## 2022-04-28 DIAGNOSIS — Z09 Encounter for follow-up examination after completed treatment for conditions other than malignant neoplasm: Secondary | ICD-10-CM | POA: Diagnosis not present

## 2022-04-28 DIAGNOSIS — E876 Hypokalemia: Secondary | ICD-10-CM | POA: Diagnosis not present

## 2022-04-28 DIAGNOSIS — Z888 Allergy status to other drugs, medicaments and biological substances status: Secondary | ICD-10-CM | POA: Diagnosis not present

## 2022-04-28 DIAGNOSIS — Z881 Allergy status to other antibiotic agents status: Secondary | ICD-10-CM | POA: Diagnosis not present

## 2022-04-28 DIAGNOSIS — Z792 Long term (current) use of antibiotics: Secondary | ICD-10-CM | POA: Diagnosis not present

## 2022-04-28 DIAGNOSIS — M869 Osteomyelitis, unspecified: Secondary | ICD-10-CM | POA: Diagnosis not present

## 2022-04-28 DIAGNOSIS — G060 Intracranial abscess and granuloma: Secondary | ICD-10-CM | POA: Diagnosis not present

## 2022-04-28 DIAGNOSIS — S0190XA Unspecified open wound of unspecified part of head, initial encounter: Secondary | ICD-10-CM | POA: Diagnosis not present

## 2022-04-29 DIAGNOSIS — D210 Benign neoplasm of connective and other soft tissue of head, face and neck: Secondary | ICD-10-CM | POA: Diagnosis not present

## 2022-04-29 DIAGNOSIS — Z452 Encounter for adjustment and management of vascular access device: Secondary | ICD-10-CM | POA: Diagnosis not present

## 2022-04-29 DIAGNOSIS — Z7982 Long term (current) use of aspirin: Secondary | ICD-10-CM | POA: Diagnosis not present

## 2022-04-29 DIAGNOSIS — G0490 Encephalitis and encephalomyelitis, unspecified: Secondary | ICD-10-CM | POA: Diagnosis not present

## 2022-04-29 DIAGNOSIS — I69354 Hemiplegia and hemiparesis following cerebral infarction affecting left non-dominant side: Secondary | ICD-10-CM | POA: Diagnosis not present

## 2022-04-29 DIAGNOSIS — Z792 Long term (current) use of antibiotics: Secondary | ICD-10-CM | POA: Diagnosis not present

## 2022-04-29 DIAGNOSIS — H409 Unspecified glaucoma: Secondary | ICD-10-CM | POA: Diagnosis not present

## 2022-04-29 DIAGNOSIS — E559 Vitamin D deficiency, unspecified: Secondary | ICD-10-CM | POA: Diagnosis not present

## 2022-04-29 DIAGNOSIS — T380X5D Adverse effect of glucocorticoids and synthetic analogues, subsequent encounter: Secondary | ICD-10-CM | POA: Diagnosis not present

## 2022-04-29 DIAGNOSIS — J309 Allergic rhinitis, unspecified: Secondary | ICD-10-CM | POA: Diagnosis not present

## 2022-04-29 DIAGNOSIS — T8141XA Infection following a procedure, superficial incisional surgical site, initial encounter: Secondary | ICD-10-CM | POA: Diagnosis not present

## 2022-04-29 DIAGNOSIS — G060 Intracranial abscess and granuloma: Secondary | ICD-10-CM | POA: Diagnosis not present

## 2022-04-29 DIAGNOSIS — Z9181 History of falling: Secondary | ICD-10-CM | POA: Diagnosis not present

## 2022-04-29 DIAGNOSIS — E0969 Drug or chemical induced diabetes mellitus with other specified complication: Secondary | ICD-10-CM | POA: Diagnosis not present

## 2022-04-29 DIAGNOSIS — D649 Anemia, unspecified: Secondary | ICD-10-CM | POA: Diagnosis not present

## 2022-04-29 DIAGNOSIS — Z7952 Long term (current) use of systemic steroids: Secondary | ICD-10-CM | POA: Diagnosis not present

## 2022-04-29 DIAGNOSIS — F0280 Dementia in other diseases classified elsewhere without behavioral disturbance: Secondary | ICD-10-CM | POA: Diagnosis not present

## 2022-04-29 DIAGNOSIS — K219 Gastro-esophageal reflux disease without esophagitis: Secondary | ICD-10-CM | POA: Diagnosis not present

## 2022-04-29 DIAGNOSIS — G40909 Epilepsy, unspecified, not intractable, without status epilepticus: Secondary | ICD-10-CM | POA: Diagnosis not present

## 2022-04-29 DIAGNOSIS — Z794 Long term (current) use of insulin: Secondary | ICD-10-CM | POA: Diagnosis not present

## 2022-04-29 DIAGNOSIS — Z8701 Personal history of pneumonia (recurrent): Secondary | ICD-10-CM | POA: Diagnosis not present

## 2022-04-29 DIAGNOSIS — E039 Hypothyroidism, unspecified: Secondary | ICD-10-CM | POA: Diagnosis not present

## 2022-04-29 DIAGNOSIS — M869 Osteomyelitis, unspecified: Secondary | ICD-10-CM | POA: Diagnosis not present

## 2022-04-29 DIAGNOSIS — Z853 Personal history of malignant neoplasm of breast: Secondary | ICD-10-CM | POA: Diagnosis not present

## 2022-04-30 DIAGNOSIS — M869 Osteomyelitis, unspecified: Secondary | ICD-10-CM | POA: Diagnosis not present

## 2022-05-03 DIAGNOSIS — D4819 Other specified neoplasm of uncertain behavior of connective and other soft tissue: Secondary | ICD-10-CM | POA: Diagnosis not present

## 2022-05-03 DIAGNOSIS — D485 Neoplasm of uncertain behavior of skin: Secondary | ICD-10-CM | POA: Diagnosis not present

## 2022-05-03 DIAGNOSIS — D210 Benign neoplasm of connective and other soft tissue of head, face and neck: Secondary | ICD-10-CM | POA: Diagnosis not present

## 2022-05-04 DIAGNOSIS — E1165 Type 2 diabetes mellitus with hyperglycemia: Secondary | ICD-10-CM | POA: Diagnosis not present

## 2022-05-04 DIAGNOSIS — Z923 Personal history of irradiation: Secondary | ICD-10-CM | POA: Diagnosis not present

## 2022-05-04 DIAGNOSIS — M869 Osteomyelitis, unspecified: Secondary | ICD-10-CM | POA: Diagnosis not present

## 2022-05-04 DIAGNOSIS — S0100XD Unspecified open wound of scalp, subsequent encounter: Secondary | ICD-10-CM | POA: Diagnosis not present

## 2022-05-04 DIAGNOSIS — L98494 Non-pressure chronic ulcer of skin of other sites with necrosis of bone: Secondary | ICD-10-CM | POA: Diagnosis not present

## 2022-05-05 ENCOUNTER — Telehealth: Payer: Self-pay | Admitting: Internal Medicine

## 2022-05-05 NOTE — Telephone Encounter (Signed)
Verbals given to [facility].  Message left for verbals for first part of message today on voicemail.

## 2022-05-05 NOTE — Telephone Encounter (Signed)
[facility] called requesting verbal orders for this patient to receive weekly lab draws and picc line dressing changes for 1 time a week for 9 weeks. Best call back number is [phone].

## 2022-05-05 NOTE — Telephone Encounter (Signed)
[place]:  [facility] with [facility]   Phone Number:  [phone]   Needs Verbal orders for what service & frequency:  Physical therapy once a week x 9 weeks

## 2022-05-05 NOTE — Telephone Encounter (Signed)
Okay for orders? 

## 2022-05-06 DIAGNOSIS — L98494 Non-pressure chronic ulcer of skin of other sites with necrosis of bone: Secondary | ICD-10-CM | POA: Diagnosis not present

## 2022-05-07 DIAGNOSIS — M869 Osteomyelitis, unspecified: Secondary | ICD-10-CM | POA: Diagnosis not present

## 2022-05-09 ENCOUNTER — Other Ambulatory Visit: Payer: Self-pay | Admitting: Internal Medicine

## 2022-05-09 DIAGNOSIS — D496 Neoplasm of unspecified behavior of brain: Secondary | ICD-10-CM | POA: Diagnosis not present

## 2022-05-09 DIAGNOSIS — Z7409 Other reduced mobility: Secondary | ICD-10-CM | POA: Diagnosis not present

## 2022-05-09 DIAGNOSIS — H409 Unspecified glaucoma: Secondary | ICD-10-CM | POA: Diagnosis not present

## 2022-05-09 DIAGNOSIS — M869 Osteomyelitis, unspecified: Secondary | ICD-10-CM | POA: Diagnosis not present

## 2022-05-09 DIAGNOSIS — E119 Type 2 diabetes mellitus without complications: Secondary | ICD-10-CM | POA: Diagnosis not present

## 2022-05-10 ENCOUNTER — Ambulatory Visit: Payer: PPO | Admitting: Podiatry

## 2022-05-10 ENCOUNTER — Other Ambulatory Visit: Payer: Self-pay

## 2022-05-10 DIAGNOSIS — B351 Tinea unguium: Secondary | ICD-10-CM

## 2022-05-10 DIAGNOSIS — Z7689 Persons encountering health services in other specified circumstances: Secondary | ICD-10-CM | POA: Diagnosis not present

## 2022-05-10 DIAGNOSIS — M79674 Pain in right toe(s): Secondary | ICD-10-CM

## 2022-05-10 DIAGNOSIS — M79675 Pain in left toe(s): Secondary | ICD-10-CM | POA: Diagnosis not present

## 2022-05-10 NOTE — Progress Notes (Signed)
  Subjective:  Patient ID: Hunter Chang, male    DOB: Apr 06, 1945,  MRN: [id number]  Chief Complaint  Patient presents with   Nail Problem    Thick nails    77 y.o. male returns for the above complaint.  Patient presents with thickened elongated dystrophic mycotic toenails x 2 moderate pain on palpation.  Pain scale 7 out of 10 he would like for me to debride down is not able to do it himself.  Objective:  There were no vitals filed for this visit. Podiatric Exam: Vascular: dorsalis pedis and posterior tibial pulses are palpable bilateral. Capillary return is immediate. Temperature gradient is WNL. Skin turgor WNL  Sensorium: Normal Semmes Weinstein monofilament test. Normal tactile sensation bilaterally. Nail Exam: Pt has thick disfigured discolored nails with subungual debris noted bilateral entire nail hallux through fifth toenails.  Pain on palpation to the nails. Ulcer Exam: There is no evidence of ulcer or pre-ulcerative changes or infection. Orthopedic Exam: Muscle tone and strength are WNL. No limitations in general ROM. No crepitus or effusions noted.  Skin: No Porokeratosis. No infection or ulcers    Assessment & Plan:   1. Pain due to onychomycosis of toenails of both feet     Patient was evaluated and treated and all questions answered.  Onychomycosis with pain  -Nails palliatively debrided as below. -Educated on self-care  Procedure: Nail Debridement Rationale: pain  Type of Debridement: manual, sharp debridement. Instrumentation: Nail nipper, rotary burr. Number of Nails: 10  Procedures and Treatment: Consent by patient was obtained for treatment procedures. The patient understood the discussion of treatment and procedures well. All questions were answered thoroughly reviewed. Debridement of mycotic and hypertrophic toenails, 1 through 5 bilateral and clearing of subungual debris. No ulceration, no infection noted.  Return Visit-Office Procedure: Patient  instructed to return to the office for a follow up visit 3 months for continued evaluation and treatment.  Boneta Lucks, DPM    No follow-ups on file.

## 2022-05-11 DIAGNOSIS — D496 Neoplasm of unspecified behavior of brain: Secondary | ICD-10-CM | POA: Diagnosis not present

## 2022-05-11 DIAGNOSIS — Z8661 Personal history of infections of the central nervous system: Secondary | ICD-10-CM | POA: Diagnosis not present

## 2022-05-11 DIAGNOSIS — Z9889 Other specified postprocedural states: Secondary | ICD-10-CM | POA: Diagnosis not present

## 2022-05-11 DIAGNOSIS — R569 Unspecified convulsions: Secondary | ICD-10-CM | POA: Diagnosis not present

## 2022-05-11 DIAGNOSIS — Z79899 Other long term (current) drug therapy: Secondary | ICD-10-CM | POA: Diagnosis not present

## 2022-05-12 DIAGNOSIS — M869 Osteomyelitis, unspecified: Secondary | ICD-10-CM | POA: Diagnosis not present

## 2022-05-13 DIAGNOSIS — D485 Neoplasm of uncertain behavior of skin: Secondary | ICD-10-CM | POA: Diagnosis not present

## 2022-05-13 DIAGNOSIS — D496 Neoplasm of unspecified behavior of brain: Secondary | ICD-10-CM | POA: Diagnosis not present

## 2022-05-13 DIAGNOSIS — G0490 Encephalitis and encephalomyelitis, unspecified: Secondary | ICD-10-CM | POA: Diagnosis not present

## 2022-05-14 DIAGNOSIS — M869 Osteomyelitis, unspecified: Secondary | ICD-10-CM | POA: Diagnosis not present

## 2022-05-16 DIAGNOSIS — T8141XA Infection following a procedure, superficial incisional surgical site, initial encounter: Secondary | ICD-10-CM | POA: Diagnosis not present

## 2022-05-16 DIAGNOSIS — D210 Benign neoplasm of connective and other soft tissue of head, face and neck: Secondary | ICD-10-CM | POA: Diagnosis not present

## 2022-05-17 DIAGNOSIS — C4A9 Merkel cell carcinoma, unspecified: Secondary | ICD-10-CM | POA: Diagnosis not present

## 2022-05-17 DIAGNOSIS — M869 Osteomyelitis, unspecified: Secondary | ICD-10-CM | POA: Diagnosis not present

## 2022-05-17 DIAGNOSIS — D485 Neoplasm of uncertain behavior of skin: Secondary | ICD-10-CM | POA: Diagnosis not present

## 2022-05-17 DIAGNOSIS — R7989 Other specified abnormal findings of blood chemistry: Secondary | ICD-10-CM | POA: Diagnosis not present

## 2022-05-17 DIAGNOSIS — Z452 Encounter for adjustment and management of vascular access device: Secondary | ICD-10-CM | POA: Diagnosis not present

## 2022-05-17 DIAGNOSIS — D496 Neoplasm of unspecified behavior of brain: Secondary | ICD-10-CM | POA: Diagnosis not present

## 2022-05-18 DIAGNOSIS — L98494 Non-pressure chronic ulcer of skin of other sites with necrosis of bone: Secondary | ICD-10-CM | POA: Diagnosis not present

## 2022-05-24 DIAGNOSIS — G0490 Encephalitis and encephalomyelitis, unspecified: Secondary | ICD-10-CM | POA: Diagnosis not present

## 2022-05-24 DIAGNOSIS — G40909 Epilepsy, unspecified, not intractable, without status epilepticus: Secondary | ICD-10-CM | POA: Diagnosis not present

## 2022-05-24 DIAGNOSIS — Z853 Personal history of malignant neoplasm of breast: Secondary | ICD-10-CM | POA: Diagnosis not present

## 2022-05-24 DIAGNOSIS — F0280 Dementia in other diseases classified elsewhere without behavioral disturbance: Secondary | ICD-10-CM | POA: Diagnosis not present

## 2022-05-24 DIAGNOSIS — E0969 Drug or chemical induced diabetes mellitus with other specified complication: Secondary | ICD-10-CM | POA: Diagnosis not present

## 2022-05-24 DIAGNOSIS — J309 Allergic rhinitis, unspecified: Secondary | ICD-10-CM | POA: Diagnosis not present

## 2022-05-24 DIAGNOSIS — G060 Intracranial abscess and granuloma: Secondary | ICD-10-CM | POA: Diagnosis not present

## 2022-05-24 DIAGNOSIS — D649 Anemia, unspecified: Secondary | ICD-10-CM | POA: Diagnosis not present

## 2022-05-24 DIAGNOSIS — Z794 Long term (current) use of insulin: Secondary | ICD-10-CM | POA: Diagnosis not present

## 2022-05-24 DIAGNOSIS — T8141XA Infection following a procedure, superficial incisional surgical site, initial encounter: Secondary | ICD-10-CM | POA: Diagnosis not present

## 2022-05-24 DIAGNOSIS — Z7952 Long term (current) use of systemic steroids: Secondary | ICD-10-CM | POA: Diagnosis not present

## 2022-05-24 DIAGNOSIS — D210 Benign neoplasm of connective and other soft tissue of head, face and neck: Secondary | ICD-10-CM | POA: Diagnosis not present

## 2022-05-24 DIAGNOSIS — Z792 Long term (current) use of antibiotics: Secondary | ICD-10-CM | POA: Diagnosis not present

## 2022-05-24 DIAGNOSIS — E559 Vitamin D deficiency, unspecified: Secondary | ICD-10-CM | POA: Diagnosis not present

## 2022-05-24 DIAGNOSIS — H409 Unspecified glaucoma: Secondary | ICD-10-CM | POA: Diagnosis not present

## 2022-05-24 DIAGNOSIS — T380X5D Adverse effect of glucocorticoids and synthetic analogues, subsequent encounter: Secondary | ICD-10-CM | POA: Diagnosis not present

## 2022-05-24 DIAGNOSIS — I69354 Hemiplegia and hemiparesis following cerebral infarction affecting left non-dominant side: Secondary | ICD-10-CM | POA: Diagnosis not present

## 2022-05-24 DIAGNOSIS — Z9181 History of falling: Secondary | ICD-10-CM | POA: Diagnosis not present

## 2022-05-24 DIAGNOSIS — K219 Gastro-esophageal reflux disease without esophagitis: Secondary | ICD-10-CM | POA: Diagnosis not present

## 2022-05-24 DIAGNOSIS — Z8701 Personal history of pneumonia (recurrent): Secondary | ICD-10-CM | POA: Diagnosis not present

## 2022-05-24 DIAGNOSIS — Z7982 Long term (current) use of aspirin: Secondary | ICD-10-CM | POA: Diagnosis not present

## 2022-05-24 DIAGNOSIS — E039 Hypothyroidism, unspecified: Secondary | ICD-10-CM | POA: Diagnosis not present

## 2022-05-24 DIAGNOSIS — Z452 Encounter for adjustment and management of vascular access device: Secondary | ICD-10-CM | POA: Diagnosis not present

## 2022-05-24 DIAGNOSIS — M869 Osteomyelitis, unspecified: Secondary | ICD-10-CM | POA: Diagnosis not present

## 2022-05-27 DIAGNOSIS — H401131 Primary open-angle glaucoma, bilateral, mild stage: Secondary | ICD-10-CM | POA: Diagnosis not present

## 2022-06-02 DIAGNOSIS — E559 Vitamin D deficiency, unspecified: Secondary | ICD-10-CM | POA: Diagnosis not present

## 2022-06-02 DIAGNOSIS — T8141XA Infection following a procedure, superficial incisional surgical site, initial encounter: Secondary | ICD-10-CM | POA: Diagnosis not present

## 2022-06-02 DIAGNOSIS — Z853 Personal history of malignant neoplasm of breast: Secondary | ICD-10-CM | POA: Diagnosis not present

## 2022-06-02 DIAGNOSIS — G40909 Epilepsy, unspecified, not intractable, without status epilepticus: Secondary | ICD-10-CM | POA: Diagnosis not present

## 2022-06-02 DIAGNOSIS — H409 Unspecified glaucoma: Secondary | ICD-10-CM | POA: Diagnosis not present

## 2022-06-02 DIAGNOSIS — E039 Hypothyroidism, unspecified: Secondary | ICD-10-CM | POA: Diagnosis not present

## 2022-06-02 DIAGNOSIS — J309 Allergic rhinitis, unspecified: Secondary | ICD-10-CM | POA: Diagnosis not present

## 2022-06-02 DIAGNOSIS — G060 Intracranial abscess and granuloma: Secondary | ICD-10-CM | POA: Diagnosis not present

## 2022-06-02 DIAGNOSIS — I69354 Hemiplegia and hemiparesis following cerebral infarction affecting left non-dominant side: Secondary | ICD-10-CM | POA: Diagnosis not present

## 2022-06-02 DIAGNOSIS — Z9181 History of falling: Secondary | ICD-10-CM | POA: Diagnosis not present

## 2022-06-02 DIAGNOSIS — F0280 Dementia in other diseases classified elsewhere without behavioral disturbance: Secondary | ICD-10-CM | POA: Diagnosis not present

## 2022-06-02 DIAGNOSIS — T380X5D Adverse effect of glucocorticoids and synthetic analogues, subsequent encounter: Secondary | ICD-10-CM | POA: Diagnosis not present

## 2022-06-02 DIAGNOSIS — E0969 Drug or chemical induced diabetes mellitus with other specified complication: Secondary | ICD-10-CM | POA: Diagnosis not present

## 2022-06-02 DIAGNOSIS — D210 Benign neoplasm of connective and other soft tissue of head, face and neck: Secondary | ICD-10-CM | POA: Diagnosis not present

## 2022-06-02 DIAGNOSIS — Z8701 Personal history of pneumonia (recurrent): Secondary | ICD-10-CM | POA: Diagnosis not present

## 2022-06-02 DIAGNOSIS — K219 Gastro-esophageal reflux disease without esophagitis: Secondary | ICD-10-CM | POA: Diagnosis not present

## 2022-06-02 DIAGNOSIS — Z794 Long term (current) use of insulin: Secondary | ICD-10-CM | POA: Diagnosis not present

## 2022-06-02 DIAGNOSIS — M869 Osteomyelitis, unspecified: Secondary | ICD-10-CM | POA: Diagnosis not present

## 2022-06-02 DIAGNOSIS — Z792 Long term (current) use of antibiotics: Secondary | ICD-10-CM | POA: Diagnosis not present

## 2022-06-02 DIAGNOSIS — Z7952 Long term (current) use of systemic steroids: Secondary | ICD-10-CM | POA: Diagnosis not present

## 2022-06-02 DIAGNOSIS — Z7982 Long term (current) use of aspirin: Secondary | ICD-10-CM | POA: Diagnosis not present

## 2022-06-02 DIAGNOSIS — D649 Anemia, unspecified: Secondary | ICD-10-CM | POA: Diagnosis not present

## 2022-06-02 DIAGNOSIS — G0490 Encephalitis and encephalomyelitis, unspecified: Secondary | ICD-10-CM | POA: Diagnosis not present

## 2022-06-02 DIAGNOSIS — Z452 Encounter for adjustment and management of vascular access device: Secondary | ICD-10-CM | POA: Diagnosis not present

## 2022-06-03 ENCOUNTER — Telehealth: Payer: Self-pay | Admitting: *Deleted

## 2022-06-03 NOTE — Progress Notes (Signed)
  Care Coordination   Note   06/03/2022 Name: [name] MRN: [id number] DOB: September 23, 1945  Hunter Chang is a 77 y.o. year old male who sees Burns, Claudina Lick, MD for primary care. I reached out to Hunter Chang by phone today to offer care coordination services.  [name] was given information about Care Coordination services today including:   The Care Coordination services include support from the care team which includes your Nurse Coordinator, Clinical Social Worker, or Pharmacist.  The Care Coordination team is here to help remove barriers to the health concerns and goals most important to you. Care Coordination services are voluntary, and the patient may decline or stop services at any time by request to their care team member.   Care Coordination Consent Status: Patient agreed to services and verbal consent obtained.   Follow up plan:  Telephone appointment with care coordination team member scheduled for:  06/13/22  Encounter Outcome:  Pt. Scheduled  [facility]  Direct Dial: [phone]

## 2022-06-06 DIAGNOSIS — Z7982 Long term (current) use of aspirin: Secondary | ICD-10-CM

## 2022-06-06 DIAGNOSIS — G40909 Epilepsy, unspecified, not intractable, without status epilepticus: Secondary | ICD-10-CM | POA: Diagnosis not present

## 2022-06-06 DIAGNOSIS — E0969 Drug or chemical induced diabetes mellitus with other specified complication: Secondary | ICD-10-CM | POA: Diagnosis not present

## 2022-06-06 DIAGNOSIS — D649 Anemia, unspecified: Secondary | ICD-10-CM

## 2022-06-06 DIAGNOSIS — Z853 Personal history of malignant neoplasm of breast: Secondary | ICD-10-CM

## 2022-06-06 DIAGNOSIS — K219 Gastro-esophageal reflux disease without esophagitis: Secondary | ICD-10-CM

## 2022-06-06 DIAGNOSIS — I69354 Hemiplegia and hemiparesis following cerebral infarction affecting left non-dominant side: Secondary | ICD-10-CM | POA: Diagnosis not present

## 2022-06-06 DIAGNOSIS — Z452 Encounter for adjustment and management of vascular access device: Secondary | ICD-10-CM

## 2022-06-06 DIAGNOSIS — H409 Unspecified glaucoma: Secondary | ICD-10-CM

## 2022-06-06 DIAGNOSIS — Z8701 Personal history of pneumonia (recurrent): Secondary | ICD-10-CM

## 2022-06-06 DIAGNOSIS — E559 Vitamin D deficiency, unspecified: Secondary | ICD-10-CM | POA: Diagnosis not present

## 2022-06-06 DIAGNOSIS — Z792 Long term (current) use of antibiotics: Secondary | ICD-10-CM

## 2022-06-06 DIAGNOSIS — G060 Intracranial abscess and granuloma: Secondary | ICD-10-CM | POA: Diagnosis not present

## 2022-06-06 DIAGNOSIS — G0490 Encephalitis and encephalomyelitis, unspecified: Secondary | ICD-10-CM | POA: Diagnosis not present

## 2022-06-06 DIAGNOSIS — T8141XA Infection following a procedure, superficial incisional surgical site, initial encounter: Secondary | ICD-10-CM | POA: Diagnosis not present

## 2022-06-06 DIAGNOSIS — E039 Hypothyroidism, unspecified: Secondary | ICD-10-CM | POA: Diagnosis not present

## 2022-06-06 DIAGNOSIS — M869 Osteomyelitis, unspecified: Secondary | ICD-10-CM | POA: Diagnosis not present

## 2022-06-06 DIAGNOSIS — Z7952 Long term (current) use of systemic steroids: Secondary | ICD-10-CM

## 2022-06-06 DIAGNOSIS — Z794 Long term (current) use of insulin: Secondary | ICD-10-CM

## 2022-06-06 DIAGNOSIS — F0280 Dementia in other diseases classified elsewhere without behavioral disturbance: Secondary | ICD-10-CM | POA: Diagnosis not present

## 2022-06-06 DIAGNOSIS — J309 Allergic rhinitis, unspecified: Secondary | ICD-10-CM

## 2022-06-06 DIAGNOSIS — T380X5D Adverse effect of glucocorticoids and synthetic analogues, subsequent encounter: Secondary | ICD-10-CM | POA: Diagnosis not present

## 2022-06-06 DIAGNOSIS — Z9181 History of falling: Secondary | ICD-10-CM

## 2022-06-06 DIAGNOSIS — D210 Benign neoplasm of connective and other soft tissue of head, face and neck: Secondary | ICD-10-CM | POA: Diagnosis not present

## 2022-06-07 ENCOUNTER — Other Ambulatory Visit: Payer: Self-pay

## 2022-06-07 ENCOUNTER — Other Ambulatory Visit: Payer: Self-pay | Admitting: Internal Medicine

## 2022-06-07 DIAGNOSIS — H409 Unspecified glaucoma: Secondary | ICD-10-CM | POA: Diagnosis not present

## 2022-06-07 DIAGNOSIS — D496 Neoplasm of unspecified behavior of brain: Secondary | ICD-10-CM | POA: Diagnosis not present

## 2022-06-07 DIAGNOSIS — E119 Type 2 diabetes mellitus without complications: Secondary | ICD-10-CM | POA: Diagnosis not present

## 2022-06-07 DIAGNOSIS — Z7409 Other reduced mobility: Secondary | ICD-10-CM | POA: Diagnosis not present

## 2022-06-09 ENCOUNTER — Other Ambulatory Visit: Payer: Self-pay | Admitting: Internal Medicine

## 2022-06-13 ENCOUNTER — Ambulatory Visit: Payer: Self-pay

## 2022-06-13 NOTE — Patient Outreach (Addendum)
  Care Coordination   Initial Visit Note   06/13/2022 Name: Hunter Chang MRN: [id number] DOB: 1945/09/15  Hunter Chang is a 77 y.o. year old male who sees Burns, Claudina Lick, MD for primary care. I spoke with  Hunter Chang by phone today.  What matters to the patients health and wellness today?  Mr. Hunter Chang reports his wife helps him manage his care. He denies any care coordination, disease management or resource needs. Encouraged to contact RNCM or PCP if care coordination needs in the future. Mr. Hunter Chang reports he will contact primary care if care coordination needs in the future.  Goals Addressed             This Visit's Progress    COMPLETED: Care coordination Activities       Interventions Today    Flowsheet Row Most Recent Value  General Interventions   General Interventions Discussed/Reviewed General Interventions Discussed  [discussed care coordination program. patient encouraged to contact RNCM if care coordination needs in the future.]            SDOH assessments and interventions completed:  Yes  SDOH Interventions Today    Flowsheet Row Most Recent Value  SDOH Interventions   Food Insecurity Interventions Intervention Not Indicated  Housing Interventions Intervention Not Indicated  Transportation Interventions Intervention Not Indicated  Utilities Interventions Intervention Not Indicated     Care Coordination Interventions:  Yes, provided   Follow up plan: No further intervention required.   Encounter Outcome:  Pt. Visit Completed   Thea Silversmith, RN, MSN, BSN, [place] Coordinator [phone]

## 2022-06-17 DIAGNOSIS — E039 Hypothyroidism, unspecified: Secondary | ICD-10-CM | POA: Diagnosis not present

## 2022-06-17 DIAGNOSIS — J309 Allergic rhinitis, unspecified: Secondary | ICD-10-CM | POA: Diagnosis not present

## 2022-06-17 DIAGNOSIS — Z7982 Long term (current) use of aspirin: Secondary | ICD-10-CM | POA: Diagnosis not present

## 2022-06-17 DIAGNOSIS — Z8701 Personal history of pneumonia (recurrent): Secondary | ICD-10-CM | POA: Diagnosis not present

## 2022-06-17 DIAGNOSIS — I69354 Hemiplegia and hemiparesis following cerebral infarction affecting left non-dominant side: Secondary | ICD-10-CM | POA: Diagnosis not present

## 2022-06-17 DIAGNOSIS — Z9181 History of falling: Secondary | ICD-10-CM | POA: Diagnosis not present

## 2022-06-17 DIAGNOSIS — K219 Gastro-esophageal reflux disease without esophagitis: Secondary | ICD-10-CM | POA: Diagnosis not present

## 2022-06-17 DIAGNOSIS — E0969 Drug or chemical induced diabetes mellitus with other specified complication: Secondary | ICD-10-CM | POA: Diagnosis not present

## 2022-06-17 DIAGNOSIS — T380X5D Adverse effect of glucocorticoids and synthetic analogues, subsequent encounter: Secondary | ICD-10-CM | POA: Diagnosis not present

## 2022-06-17 DIAGNOSIS — M869 Osteomyelitis, unspecified: Secondary | ICD-10-CM | POA: Diagnosis not present

## 2022-06-17 DIAGNOSIS — Z7952 Long term (current) use of systemic steroids: Secondary | ICD-10-CM | POA: Diagnosis not present

## 2022-06-17 DIAGNOSIS — G060 Intracranial abscess and granuloma: Secondary | ICD-10-CM | POA: Diagnosis not present

## 2022-06-17 DIAGNOSIS — H409 Unspecified glaucoma: Secondary | ICD-10-CM | POA: Diagnosis not present

## 2022-06-17 DIAGNOSIS — F0280 Dementia in other diseases classified elsewhere without behavioral disturbance: Secondary | ICD-10-CM | POA: Diagnosis not present

## 2022-06-17 DIAGNOSIS — T8141XA Infection following a procedure, superficial incisional surgical site, initial encounter: Secondary | ICD-10-CM | POA: Diagnosis not present

## 2022-06-17 DIAGNOSIS — E559 Vitamin D deficiency, unspecified: Secondary | ICD-10-CM | POA: Diagnosis not present

## 2022-06-17 DIAGNOSIS — G40909 Epilepsy, unspecified, not intractable, without status epilepticus: Secondary | ICD-10-CM | POA: Diagnosis not present

## 2022-06-17 DIAGNOSIS — G0490 Encephalitis and encephalomyelitis, unspecified: Secondary | ICD-10-CM | POA: Diagnosis not present

## 2022-06-17 DIAGNOSIS — Z452 Encounter for adjustment and management of vascular access device: Secondary | ICD-10-CM | POA: Diagnosis not present

## 2022-06-17 DIAGNOSIS — Z792 Long term (current) use of antibiotics: Secondary | ICD-10-CM | POA: Diagnosis not present

## 2022-06-17 DIAGNOSIS — D210 Benign neoplasm of connective and other soft tissue of head, face and neck: Secondary | ICD-10-CM | POA: Diagnosis not present

## 2022-06-17 DIAGNOSIS — D649 Anemia, unspecified: Secondary | ICD-10-CM | POA: Diagnosis not present

## 2022-06-17 DIAGNOSIS — Z794 Long term (current) use of insulin: Secondary | ICD-10-CM | POA: Diagnosis not present

## 2022-06-17 DIAGNOSIS — Z853 Personal history of malignant neoplasm of breast: Secondary | ICD-10-CM | POA: Diagnosis not present

## 2022-06-22 DIAGNOSIS — L98496 Non-pressure chronic ulcer of skin of other sites with bone involvement without evidence of necrosis: Secondary | ICD-10-CM | POA: Diagnosis not present

## 2022-06-22 DIAGNOSIS — I96 Gangrene, not elsewhere classified: Secondary | ICD-10-CM | POA: Diagnosis not present

## 2022-06-22 DIAGNOSIS — L98494 Non-pressure chronic ulcer of skin of other sites with necrosis of bone: Secondary | ICD-10-CM | POA: Diagnosis not present

## 2022-06-23 DIAGNOSIS — T8141XA Infection following a procedure, superficial incisional surgical site, initial encounter: Secondary | ICD-10-CM | POA: Diagnosis not present

## 2022-06-23 DIAGNOSIS — Z853 Personal history of malignant neoplasm of breast: Secondary | ICD-10-CM | POA: Diagnosis not present

## 2022-06-23 DIAGNOSIS — Z8701 Personal history of pneumonia (recurrent): Secondary | ICD-10-CM | POA: Diagnosis not present

## 2022-06-23 DIAGNOSIS — E559 Vitamin D deficiency, unspecified: Secondary | ICD-10-CM | POA: Diagnosis not present

## 2022-06-23 DIAGNOSIS — E0969 Drug or chemical induced diabetes mellitus with other specified complication: Secondary | ICD-10-CM | POA: Diagnosis not present

## 2022-06-23 DIAGNOSIS — G060 Intracranial abscess and granuloma: Secondary | ICD-10-CM | POA: Diagnosis not present

## 2022-06-23 DIAGNOSIS — Z7952 Long term (current) use of systemic steroids: Secondary | ICD-10-CM | POA: Diagnosis not present

## 2022-06-23 DIAGNOSIS — Z9181 History of falling: Secondary | ICD-10-CM | POA: Diagnosis not present

## 2022-06-23 DIAGNOSIS — K219 Gastro-esophageal reflux disease without esophagitis: Secondary | ICD-10-CM | POA: Diagnosis not present

## 2022-06-23 DIAGNOSIS — M869 Osteomyelitis, unspecified: Secondary | ICD-10-CM | POA: Diagnosis not present

## 2022-06-23 DIAGNOSIS — F0280 Dementia in other diseases classified elsewhere without behavioral disturbance: Secondary | ICD-10-CM | POA: Diagnosis not present

## 2022-06-23 DIAGNOSIS — I69354 Hemiplegia and hemiparesis following cerebral infarction affecting left non-dominant side: Secondary | ICD-10-CM | POA: Diagnosis not present

## 2022-06-23 DIAGNOSIS — T380X5D Adverse effect of glucocorticoids and synthetic analogues, subsequent encounter: Secondary | ICD-10-CM | POA: Diagnosis not present

## 2022-06-23 DIAGNOSIS — Z7982 Long term (current) use of aspirin: Secondary | ICD-10-CM | POA: Diagnosis not present

## 2022-06-23 DIAGNOSIS — E039 Hypothyroidism, unspecified: Secondary | ICD-10-CM | POA: Diagnosis not present

## 2022-06-23 DIAGNOSIS — G0490 Encephalitis and encephalomyelitis, unspecified: Secondary | ICD-10-CM | POA: Diagnosis not present

## 2022-06-23 DIAGNOSIS — J309 Allergic rhinitis, unspecified: Secondary | ICD-10-CM | POA: Diagnosis not present

## 2022-06-23 DIAGNOSIS — D210 Benign neoplasm of connective and other soft tissue of head, face and neck: Secondary | ICD-10-CM | POA: Diagnosis not present

## 2022-06-23 DIAGNOSIS — Z792 Long term (current) use of antibiotics: Secondary | ICD-10-CM | POA: Diagnosis not present

## 2022-06-23 DIAGNOSIS — H409 Unspecified glaucoma: Secondary | ICD-10-CM | POA: Diagnosis not present

## 2022-06-23 DIAGNOSIS — Z794 Long term (current) use of insulin: Secondary | ICD-10-CM | POA: Diagnosis not present

## 2022-06-23 DIAGNOSIS — Z452 Encounter for adjustment and management of vascular access device: Secondary | ICD-10-CM | POA: Diagnosis not present

## 2022-06-23 DIAGNOSIS — D649 Anemia, unspecified: Secondary | ICD-10-CM | POA: Diagnosis not present

## 2022-06-23 DIAGNOSIS — G40909 Epilepsy, unspecified, not intractable, without status epilepticus: Secondary | ICD-10-CM | POA: Diagnosis not present

## 2022-06-29 DIAGNOSIS — R569 Unspecified convulsions: Secondary | ICD-10-CM | POA: Diagnosis not present

## 2022-06-29 DIAGNOSIS — R29898 Other symptoms and signs involving the musculoskeletal system: Secondary | ICD-10-CM | POA: Diagnosis not present

## 2022-07-05 DIAGNOSIS — M869 Osteomyelitis, unspecified: Secondary | ICD-10-CM | POA: Diagnosis not present

## 2022-07-05 DIAGNOSIS — Z9181 History of falling: Secondary | ICD-10-CM | POA: Diagnosis not present

## 2022-07-05 DIAGNOSIS — Z7952 Long term (current) use of systemic steroids: Secondary | ICD-10-CM | POA: Diagnosis not present

## 2022-07-05 DIAGNOSIS — Z8701 Personal history of pneumonia (recurrent): Secondary | ICD-10-CM | POA: Diagnosis not present

## 2022-07-05 DIAGNOSIS — Z853 Personal history of malignant neoplasm of breast: Secondary | ICD-10-CM | POA: Diagnosis not present

## 2022-07-05 DIAGNOSIS — E0969 Drug or chemical induced diabetes mellitus with other specified complication: Secondary | ICD-10-CM | POA: Diagnosis not present

## 2022-07-05 DIAGNOSIS — H409 Unspecified glaucoma: Secondary | ICD-10-CM | POA: Diagnosis not present

## 2022-07-05 DIAGNOSIS — E039 Hypothyroidism, unspecified: Secondary | ICD-10-CM | POA: Diagnosis not present

## 2022-07-05 DIAGNOSIS — G0490 Encephalitis and encephalomyelitis, unspecified: Secondary | ICD-10-CM | POA: Diagnosis not present

## 2022-07-05 DIAGNOSIS — Z794 Long term (current) use of insulin: Secondary | ICD-10-CM | POA: Diagnosis not present

## 2022-07-05 DIAGNOSIS — E559 Vitamin D deficiency, unspecified: Secondary | ICD-10-CM | POA: Diagnosis not present

## 2022-07-05 DIAGNOSIS — D649 Anemia, unspecified: Secondary | ICD-10-CM | POA: Diagnosis not present

## 2022-07-05 DIAGNOSIS — G060 Intracranial abscess and granuloma: Secondary | ICD-10-CM | POA: Diagnosis not present

## 2022-07-05 DIAGNOSIS — F0280 Dementia in other diseases classified elsewhere without behavioral disturbance: Secondary | ICD-10-CM | POA: Diagnosis not present

## 2022-07-05 DIAGNOSIS — G40909 Epilepsy, unspecified, not intractable, without status epilepticus: Secondary | ICD-10-CM | POA: Diagnosis not present

## 2022-07-05 DIAGNOSIS — I69354 Hemiplegia and hemiparesis following cerebral infarction affecting left non-dominant side: Secondary | ICD-10-CM | POA: Diagnosis not present

## 2022-07-05 DIAGNOSIS — Z452 Encounter for adjustment and management of vascular access device: Secondary | ICD-10-CM | POA: Diagnosis not present

## 2022-07-05 DIAGNOSIS — Z7982 Long term (current) use of aspirin: Secondary | ICD-10-CM | POA: Diagnosis not present

## 2022-07-05 DIAGNOSIS — T380X5D Adverse effect of glucocorticoids and synthetic analogues, subsequent encounter: Secondary | ICD-10-CM | POA: Diagnosis not present

## 2022-07-05 DIAGNOSIS — T8141XA Infection following a procedure, superficial incisional surgical site, initial encounter: Secondary | ICD-10-CM | POA: Diagnosis not present

## 2022-07-05 DIAGNOSIS — Z792 Long term (current) use of antibiotics: Secondary | ICD-10-CM | POA: Diagnosis not present

## 2022-07-05 DIAGNOSIS — D210 Benign neoplasm of connective and other soft tissue of head, face and neck: Secondary | ICD-10-CM | POA: Diagnosis not present

## 2022-07-05 DIAGNOSIS — K219 Gastro-esophageal reflux disease without esophagitis: Secondary | ICD-10-CM | POA: Diagnosis not present

## 2022-07-05 DIAGNOSIS — J309 Allergic rhinitis, unspecified: Secondary | ICD-10-CM | POA: Diagnosis not present

## 2022-07-08 DIAGNOSIS — D496 Neoplasm of unspecified behavior of brain: Secondary | ICD-10-CM | POA: Diagnosis not present

## 2022-07-08 DIAGNOSIS — R569 Unspecified convulsions: Secondary | ICD-10-CM | POA: Diagnosis not present

## 2022-07-12 DIAGNOSIS — D485 Neoplasm of uncertain behavior of skin: Secondary | ICD-10-CM | POA: Diagnosis not present

## 2022-07-12 DIAGNOSIS — C490 Malignant neoplasm of connective and soft tissue of head, face and neck: Secondary | ICD-10-CM | POA: Diagnosis not present

## 2022-07-12 DIAGNOSIS — C4449 Other specified malignant neoplasm of skin of scalp and neck: Secondary | ICD-10-CM | POA: Diagnosis not present

## 2022-07-12 DIAGNOSIS — C801 Malignant (primary) neoplasm, unspecified: Secondary | ICD-10-CM | POA: Diagnosis not present

## 2022-07-12 DIAGNOSIS — D496 Neoplasm of unspecified behavior of brain: Secondary | ICD-10-CM | POA: Diagnosis not present

## 2022-07-12 DIAGNOSIS — C7931 Secondary malignant neoplasm of brain: Secondary | ICD-10-CM | POA: Diagnosis not present

## 2022-07-13 ENCOUNTER — Telehealth: Payer: Self-pay | Admitting: Internal Medicine

## 2022-07-13 ENCOUNTER — Encounter: Payer: Self-pay | Admitting: Internal Medicine

## 2022-07-13 NOTE — Telephone Encounter (Signed)
Patients wife called, Mychart was showing appointments for patient [none seen on my end within the [facility] system] she stated they transferred care to [facility]

## 2022-08-10 ENCOUNTER — Encounter: Payer: Self-pay | Admitting: Internal Medicine

## 2022-08-10 ENCOUNTER — Ambulatory Visit: Payer: PPO | Admitting: Podiatry

## 2022-08-10 ENCOUNTER — Encounter: Payer: Self-pay | Admitting: Podiatry

## 2022-08-10 DIAGNOSIS — B351 Tinea unguium: Secondary | ICD-10-CM

## 2022-08-10 DIAGNOSIS — E119 Type 2 diabetes mellitus without complications: Secondary | ICD-10-CM

## 2022-08-10 DIAGNOSIS — M79674 Pain in right toe(s): Secondary | ICD-10-CM

## 2022-08-10 DIAGNOSIS — M79675 Pain in left toe(s): Secondary | ICD-10-CM | POA: Diagnosis not present

## 2022-08-10 DIAGNOSIS — Z794 Long term (current) use of insulin: Secondary | ICD-10-CM

## 2022-08-10 MED ORDER — OMEPRAZOLE 20 MG PO CPDR: 20.0000 mg | DELAYED_RELEASE_CAPSULE | Freq: Every day | ORAL | 1 refills | Status: DC

## 2022-08-10 NOTE — Progress Notes (Signed)
This patient returns to my office for at risk foot care.  This patient requires this care by a professional since this patient will be at risk due to having diabetes.  This patient is unable to cut nails himself since the patient cannot reach his nails.These nails are painful walking and wearing shoes.  He presents to the office with his daughter. This patient presents for at risk foot care today.  General Appearance  Alert, conversant and in no acute stress.  Vascular  Dorsalis pedis and posterior tibial  pulses are palpable  bilaterally.  Capillary return is within normal limits  bilaterally. Temperature is within normal limits  bilaterally.  Neurologic  Senn-Weinstein monofilament wire test within normal limits  bilaterally. Muscle power within normal limits bilaterally.  Nails Thick disfigured discolored nails with subungual debris  from hallux to fifth toes bilaterally. No evidence of bacterial infection or drainage bilaterally.  Orthopedic  No limitations of motion  feet .  No crepitus or effusions noted.  No bony pathology or digital deformities noted.  Skin  normotropic skin with no porokeratosis noted bilaterally.  No signs of infections or ulcers noted.     Onychomycosis  Pain in right toes  Pain in left toes  Consent was obtained for treatment procedures.   Mechanical debridement of nails 1-5  bilaterally performed with a nail nipper.  Filed with dremel without incident.    Return office visit   3 months                   Told patient to return for periodic foot care and evaluation due to potential at risk complications.   Helane Gunther DPM

## 2022-08-18 DIAGNOSIS — Z85828 Personal history of other malignant neoplasm of skin: Secondary | ICD-10-CM | POA: Diagnosis not present

## 2022-08-18 DIAGNOSIS — L570 Actinic keratosis: Secondary | ICD-10-CM | POA: Diagnosis not present

## 2022-08-18 DIAGNOSIS — D692 Other nonthrombocytopenic purpura: Secondary | ICD-10-CM | POA: Diagnosis not present

## 2022-08-18 DIAGNOSIS — L821 Other seborrheic keratosis: Secondary | ICD-10-CM | POA: Diagnosis not present

## 2022-08-24 DIAGNOSIS — E1152 Type 2 diabetes mellitus with diabetic peripheral angiopathy with gangrene: Secondary | ICD-10-CM | POA: Diagnosis not present

## 2022-08-24 DIAGNOSIS — L98494 Non-pressure chronic ulcer of skin of other sites with necrosis of bone: Secondary | ICD-10-CM | POA: Diagnosis not present

## 2022-08-24 DIAGNOSIS — I96 Gangrene, not elsewhere classified: Secondary | ICD-10-CM | POA: Diagnosis not present

## 2022-08-24 DIAGNOSIS — E11622 Type 2 diabetes mellitus with other skin ulcer: Secondary | ICD-10-CM | POA: Diagnosis not present

## 2022-08-25 DIAGNOSIS — R569 Unspecified convulsions: Secondary | ICD-10-CM | POA: Diagnosis not present

## 2022-08-25 DIAGNOSIS — D496 Neoplasm of unspecified behavior of brain: Secondary | ICD-10-CM | POA: Diagnosis not present

## 2022-08-26 DIAGNOSIS — L98494 Non-pressure chronic ulcer of skin of other sites with necrosis of bone: Secondary | ICD-10-CM | POA: Diagnosis not present

## 2022-09-06 DIAGNOSIS — D496 Neoplasm of unspecified behavior of brain: Secondary | ICD-10-CM | POA: Diagnosis not present

## 2022-09-06 DIAGNOSIS — C460 Kaposi's sarcoma of skin: Secondary | ICD-10-CM | POA: Diagnosis not present

## 2022-09-06 DIAGNOSIS — D485 Neoplasm of uncertain behavior of skin: Secondary | ICD-10-CM | POA: Diagnosis not present

## 2022-09-07 DIAGNOSIS — Z923 Personal history of irradiation: Secondary | ICD-10-CM | POA: Diagnosis not present

## 2022-09-07 DIAGNOSIS — R569 Unspecified convulsions: Secondary | ICD-10-CM | POA: Diagnosis not present

## 2022-09-07 DIAGNOSIS — E876 Hypokalemia: Secondary | ICD-10-CM | POA: Diagnosis not present

## 2022-09-07 DIAGNOSIS — Z7982 Long term (current) use of aspirin: Secondary | ICD-10-CM | POA: Diagnosis not present

## 2022-09-07 DIAGNOSIS — R531 Weakness: Secondary | ICD-10-CM | POA: Diagnosis not present

## 2022-09-07 DIAGNOSIS — Z85828 Personal history of other malignant neoplasm of skin: Secondary | ICD-10-CM | POA: Diagnosis not present

## 2022-09-07 DIAGNOSIS — Z751 Person awaiting admission to adequate facility elsewhere: Secondary | ICD-10-CM | POA: Diagnosis not present

## 2022-09-07 DIAGNOSIS — C499 Malignant neoplasm of connective and soft tissue, unspecified: Secondary | ICD-10-CM | POA: Diagnosis not present

## 2022-09-07 DIAGNOSIS — G08 Intracranial and intraspinal phlebitis and thrombophlebitis: Secondary | ICD-10-CM | POA: Diagnosis not present

## 2022-09-07 DIAGNOSIS — C4499 Other specified malignant neoplasm of skin, unspecified: Secondary | ICD-10-CM | POA: Diagnosis not present

## 2022-09-07 DIAGNOSIS — L98494 Non-pressure chronic ulcer of skin of other sites with necrosis of bone: Secondary | ICD-10-CM | POA: Diagnosis not present

## 2022-09-07 DIAGNOSIS — G060 Intracranial abscess and granuloma: Secondary | ICD-10-CM | POA: Diagnosis not present

## 2022-09-07 DIAGNOSIS — Z91199 Patient's noncompliance with other medical treatment and regimen due to unspecified reason: Secondary | ICD-10-CM | POA: Diagnosis not present

## 2022-09-07 DIAGNOSIS — G9389 Other specified disorders of brain: Secondary | ICD-10-CM | POA: Diagnosis not present

## 2022-09-07 DIAGNOSIS — I110 Hypertensive heart disease with heart failure: Secondary | ICD-10-CM | POA: Diagnosis not present

## 2022-09-07 DIAGNOSIS — Z743 Need for continuous supervision: Secondary | ICD-10-CM | POA: Diagnosis not present

## 2022-09-07 DIAGNOSIS — E119 Type 2 diabetes mellitus without complications: Secondary | ICD-10-CM | POA: Diagnosis not present

## 2022-09-07 DIAGNOSIS — L270 Generalized skin eruption due to drugs and medicaments taken internally: Secondary | ICD-10-CM | POA: Diagnosis not present

## 2022-09-08 DIAGNOSIS — G08 Intracranial and intraspinal phlebitis and thrombophlebitis: Secondary | ICD-10-CM | POA: Diagnosis not present

## 2022-09-09 DIAGNOSIS — G08 Intracranial and intraspinal phlebitis and thrombophlebitis: Secondary | ICD-10-CM | POA: Diagnosis not present

## 2022-09-10 DIAGNOSIS — G08 Intracranial and intraspinal phlebitis and thrombophlebitis: Secondary | ICD-10-CM | POA: Diagnosis not present

## 2022-09-11 DIAGNOSIS — G08 Intracranial and intraspinal phlebitis and thrombophlebitis: Secondary | ICD-10-CM | POA: Diagnosis not present

## 2022-09-12 DIAGNOSIS — L98494 Non-pressure chronic ulcer of skin of other sites with necrosis of bone: Secondary | ICD-10-CM | POA: Diagnosis not present

## 2022-09-12 DIAGNOSIS — G08 Intracranial and intraspinal phlebitis and thrombophlebitis: Secondary | ICD-10-CM | POA: Diagnosis not present

## 2022-09-13 DIAGNOSIS — G9389 Other specified disorders of brain: Secondary | ICD-10-CM | POA: Diagnosis not present

## 2022-09-13 DIAGNOSIS — G08 Intracranial and intraspinal phlebitis and thrombophlebitis: Secondary | ICD-10-CM | POA: Diagnosis not present

## 2022-09-14 DIAGNOSIS — G08 Intracranial and intraspinal phlebitis and thrombophlebitis: Secondary | ICD-10-CM | POA: Diagnosis not present

## 2022-09-15 DIAGNOSIS — G08 Intracranial and intraspinal phlebitis and thrombophlebitis: Secondary | ICD-10-CM | POA: Diagnosis not present

## 2022-09-16 DIAGNOSIS — G08 Intracranial and intraspinal phlebitis and thrombophlebitis: Secondary | ICD-10-CM | POA: Diagnosis not present

## 2022-09-17 DIAGNOSIS — G08 Intracranial and intraspinal phlebitis and thrombophlebitis: Secondary | ICD-10-CM | POA: Diagnosis not present

## 2022-09-18 DIAGNOSIS — G08 Intracranial and intraspinal phlebitis and thrombophlebitis: Secondary | ICD-10-CM | POA: Diagnosis not present

## 2022-09-19 DIAGNOSIS — G08 Intracranial and intraspinal phlebitis and thrombophlebitis: Secondary | ICD-10-CM | POA: Diagnosis not present

## 2022-09-20 ENCOUNTER — Encounter: Payer: Self-pay | Admitting: Internal Medicine

## 2022-09-20 DIAGNOSIS — G08 Intracranial and intraspinal phlebitis and thrombophlebitis: Secondary | ICD-10-CM | POA: Diagnosis not present

## 2022-09-21 DIAGNOSIS — E7800 Pure hypercholesterolemia, unspecified: Secondary | ICD-10-CM | POA: Diagnosis not present

## 2022-09-21 DIAGNOSIS — I110 Hypertensive heart disease with heart failure: Secondary | ICD-10-CM | POA: Diagnosis not present

## 2022-09-21 DIAGNOSIS — Z1622 Resistance to vancomycin related antibiotics: Secondary | ICD-10-CM | POA: Diagnosis not present

## 2022-09-21 DIAGNOSIS — D4819 Other specified neoplasm of uncertain behavior of connective and other soft tissue: Secondary | ICD-10-CM | POA: Diagnosis not present

## 2022-09-21 DIAGNOSIS — Z743 Need for continuous supervision: Secondary | ICD-10-CM | POA: Diagnosis not present

## 2022-09-21 DIAGNOSIS — F4320 Adjustment disorder, unspecified: Secondary | ICD-10-CM | POA: Diagnosis not present

## 2022-09-21 DIAGNOSIS — C4490 Unspecified malignant neoplasm of skin, unspecified: Secondary | ICD-10-CM | POA: Diagnosis not present

## 2022-09-21 DIAGNOSIS — Z923 Personal history of irradiation: Secondary | ICD-10-CM | POA: Diagnosis not present

## 2022-09-21 DIAGNOSIS — R569 Unspecified convulsions: Secondary | ICD-10-CM | POA: Diagnosis not present

## 2022-09-21 DIAGNOSIS — D649 Anemia, unspecified: Secondary | ICD-10-CM | POA: Diagnosis not present

## 2022-09-21 DIAGNOSIS — R7982 Elevated C-reactive protein (CRP): Secondary | ICD-10-CM | POA: Diagnosis not present

## 2022-09-21 DIAGNOSIS — E785 Hyperlipidemia, unspecified: Secondary | ICD-10-CM | POA: Diagnosis not present

## 2022-09-21 DIAGNOSIS — C499 Malignant neoplasm of connective and soft tissue, unspecified: Secondary | ICD-10-CM | POA: Diagnosis not present

## 2022-09-21 DIAGNOSIS — Z91199 Patient's noncompliance with other medical treatment and regimen due to unspecified reason: Secondary | ICD-10-CM | POA: Diagnosis not present

## 2022-09-21 DIAGNOSIS — E119 Type 2 diabetes mellitus without complications: Secondary | ICD-10-CM | POA: Diagnosis not present

## 2022-09-21 DIAGNOSIS — D485 Neoplasm of uncertain behavior of skin: Secondary | ICD-10-CM | POA: Diagnosis not present

## 2022-09-21 DIAGNOSIS — G062 Extradural and subdural abscess, unspecified: Secondary | ICD-10-CM | POA: Diagnosis not present

## 2022-09-21 DIAGNOSIS — G4700 Insomnia, unspecified: Secondary | ICD-10-CM | POA: Diagnosis not present

## 2022-09-21 DIAGNOSIS — R5381 Other malaise: Secondary | ICD-10-CM | POA: Diagnosis not present

## 2022-09-21 DIAGNOSIS — F5101 Primary insomnia: Secondary | ICD-10-CM | POA: Diagnosis not present

## 2022-09-21 DIAGNOSIS — K219 Gastro-esophageal reflux disease without esophagitis: Secondary | ICD-10-CM | POA: Diagnosis not present

## 2022-09-21 DIAGNOSIS — R531 Weakness: Secondary | ICD-10-CM | POA: Diagnosis not present

## 2022-09-21 DIAGNOSIS — G08 Intracranial and intraspinal phlebitis and thrombophlebitis: Secondary | ICD-10-CM | POA: Diagnosis not present

## 2022-09-21 DIAGNOSIS — G060 Intracranial abscess and granuloma: Secondary | ICD-10-CM | POA: Diagnosis not present

## 2022-09-22 DIAGNOSIS — R531 Weakness: Secondary | ICD-10-CM | POA: Diagnosis not present

## 2022-09-22 DIAGNOSIS — D485 Neoplasm of uncertain behavior of skin: Secondary | ICD-10-CM | POA: Diagnosis not present

## 2022-09-22 DIAGNOSIS — R569 Unspecified convulsions: Secondary | ICD-10-CM | POA: Diagnosis not present

## 2022-09-22 DIAGNOSIS — C4490 Unspecified malignant neoplasm of skin, unspecified: Secondary | ICD-10-CM | POA: Diagnosis not present

## 2022-09-22 DIAGNOSIS — K219 Gastro-esophageal reflux disease without esophagitis: Secondary | ICD-10-CM | POA: Diagnosis not present

## 2022-09-22 DIAGNOSIS — R5381 Other malaise: Secondary | ICD-10-CM | POA: Diagnosis not present

## 2022-09-22 DIAGNOSIS — G060 Intracranial abscess and granuloma: Secondary | ICD-10-CM | POA: Diagnosis not present

## 2022-09-26 DIAGNOSIS — C4490 Unspecified malignant neoplasm of skin, unspecified: Secondary | ICD-10-CM | POA: Diagnosis not present

## 2022-09-26 DIAGNOSIS — K219 Gastro-esophageal reflux disease without esophagitis: Secondary | ICD-10-CM | POA: Diagnosis not present

## 2022-09-26 DIAGNOSIS — G08 Intracranial and intraspinal phlebitis and thrombophlebitis: Secondary | ICD-10-CM | POA: Diagnosis not present

## 2022-09-26 DIAGNOSIS — R531 Weakness: Secondary | ICD-10-CM | POA: Diagnosis not present

## 2022-09-26 DIAGNOSIS — D485 Neoplasm of uncertain behavior of skin: Secondary | ICD-10-CM | POA: Diagnosis not present

## 2022-09-26 DIAGNOSIS — G4700 Insomnia, unspecified: Secondary | ICD-10-CM | POA: Diagnosis not present

## 2022-09-26 DIAGNOSIS — G060 Intracranial abscess and granuloma: Secondary | ICD-10-CM | POA: Diagnosis not present

## 2022-09-26 DIAGNOSIS — R5381 Other malaise: Secondary | ICD-10-CM | POA: Diagnosis not present

## 2022-09-27 DIAGNOSIS — R5381 Other malaise: Secondary | ICD-10-CM | POA: Diagnosis not present

## 2022-09-27 DIAGNOSIS — R531 Weakness: Secondary | ICD-10-CM | POA: Diagnosis not present

## 2022-09-27 DIAGNOSIS — C4490 Unspecified malignant neoplasm of skin, unspecified: Secondary | ICD-10-CM | POA: Diagnosis not present

## 2022-09-27 DIAGNOSIS — R569 Unspecified convulsions: Secondary | ICD-10-CM | POA: Diagnosis not present

## 2022-09-27 DIAGNOSIS — D485 Neoplasm of uncertain behavior of skin: Secondary | ICD-10-CM | POA: Diagnosis not present

## 2022-10-05 DIAGNOSIS — G060 Intracranial abscess and granuloma: Secondary | ICD-10-CM | POA: Diagnosis not present

## 2022-10-05 DIAGNOSIS — G062 Extradural and subdural abscess, unspecified: Secondary | ICD-10-CM | POA: Diagnosis not present

## 2022-10-05 DIAGNOSIS — D4819 Other specified neoplasm of uncertain behavior of connective and other soft tissue: Secondary | ICD-10-CM | POA: Diagnosis not present

## 2022-10-11 ENCOUNTER — Other Ambulatory Visit: Payer: Self-pay

## 2022-10-11 ENCOUNTER — Encounter: Payer: Self-pay | Admitting: Internal Medicine

## 2022-10-11 MED ORDER — APIXABAN 5 MG PO TABS: 5.0000 mg | ORAL_TABLET | Freq: Two times a day (BID) | ORAL | 2 refills | Status: DC

## 2022-10-12 DIAGNOSIS — M869 Osteomyelitis, unspecified: Secondary | ICD-10-CM | POA: Diagnosis not present

## 2022-10-14 DIAGNOSIS — G08 Intracranial and intraspinal phlebitis and thrombophlebitis: Secondary | ICD-10-CM | POA: Diagnosis not present

## 2022-10-14 DIAGNOSIS — K219 Gastro-esophageal reflux disease without esophagitis: Secondary | ICD-10-CM | POA: Diagnosis not present

## 2022-10-14 DIAGNOSIS — G060 Intracranial abscess and granuloma: Secondary | ICD-10-CM | POA: Diagnosis not present

## 2022-10-14 DIAGNOSIS — E559 Vitamin D deficiency, unspecified: Secondary | ICD-10-CM | POA: Diagnosis not present

## 2022-10-14 DIAGNOSIS — Z993 Dependence on wheelchair: Secondary | ICD-10-CM | POA: Diagnosis not present

## 2022-10-14 DIAGNOSIS — R569 Unspecified convulsions: Secondary | ICD-10-CM | POA: Diagnosis not present

## 2022-10-14 DIAGNOSIS — Z452 Encounter for adjustment and management of vascular access device: Secondary | ICD-10-CM | POA: Diagnosis not present

## 2022-10-14 DIAGNOSIS — Z792 Long term (current) use of antibiotics: Secondary | ICD-10-CM | POA: Diagnosis not present

## 2022-10-14 DIAGNOSIS — F32A Depression, unspecified: Secondary | ICD-10-CM | POA: Diagnosis not present

## 2022-10-14 DIAGNOSIS — H409 Unspecified glaucoma: Secondary | ICD-10-CM | POA: Diagnosis not present

## 2022-10-14 DIAGNOSIS — R531 Weakness: Secondary | ICD-10-CM | POA: Diagnosis not present

## 2022-10-14 DIAGNOSIS — D239 Other benign neoplasm of skin, unspecified: Secondary | ICD-10-CM | POA: Diagnosis not present

## 2022-10-14 DIAGNOSIS — Z7901 Long term (current) use of anticoagulants: Secondary | ICD-10-CM | POA: Diagnosis not present

## 2022-10-14 DIAGNOSIS — C4490 Unspecified malignant neoplasm of skin, unspecified: Secondary | ICD-10-CM | POA: Diagnosis not present

## 2022-10-14 DIAGNOSIS — Z556 Problems related to health literacy: Secondary | ICD-10-CM | POA: Diagnosis not present

## 2022-10-14 DIAGNOSIS — Z7952 Long term (current) use of systemic steroids: Secondary | ICD-10-CM | POA: Diagnosis not present

## 2022-10-14 DIAGNOSIS — E119 Type 2 diabetes mellitus without complications: Secondary | ICD-10-CM | POA: Diagnosis not present

## 2022-10-14 DIAGNOSIS — Z9181 History of falling: Secondary | ICD-10-CM | POA: Diagnosis not present

## 2022-10-15 LAB — LAB REPORT - SCANNED: EGFR: 74

## 2022-10-16 DIAGNOSIS — Z556 Problems related to health literacy: Secondary | ICD-10-CM

## 2022-10-16 DIAGNOSIS — Z792 Long term (current) use of antibiotics: Secondary | ICD-10-CM

## 2022-10-16 DIAGNOSIS — Z452 Encounter for adjustment and management of vascular access device: Secondary | ICD-10-CM | POA: Diagnosis not present

## 2022-10-16 DIAGNOSIS — Z7901 Long term (current) use of anticoagulants: Secondary | ICD-10-CM | POA: Diagnosis not present

## 2022-10-16 DIAGNOSIS — E119 Type 2 diabetes mellitus without complications: Secondary | ICD-10-CM | POA: Diagnosis not present

## 2022-10-16 DIAGNOSIS — G060 Intracranial abscess and granuloma: Secondary | ICD-10-CM | POA: Diagnosis not present

## 2022-10-16 DIAGNOSIS — R569 Unspecified convulsions: Secondary | ICD-10-CM | POA: Diagnosis not present

## 2022-10-16 DIAGNOSIS — D239 Other benign neoplasm of skin, unspecified: Secondary | ICD-10-CM | POA: Diagnosis not present

## 2022-10-16 DIAGNOSIS — R531 Weakness: Secondary | ICD-10-CM | POA: Diagnosis not present

## 2022-10-16 DIAGNOSIS — E559 Vitamin D deficiency, unspecified: Secondary | ICD-10-CM | POA: Diagnosis not present

## 2022-10-16 DIAGNOSIS — Z7952 Long term (current) use of systemic steroids: Secondary | ICD-10-CM

## 2022-10-16 DIAGNOSIS — C4490 Unspecified malignant neoplasm of skin, unspecified: Secondary | ICD-10-CM | POA: Diagnosis not present

## 2022-10-16 DIAGNOSIS — K219 Gastro-esophageal reflux disease without esophagitis: Secondary | ICD-10-CM | POA: Diagnosis not present

## 2022-10-16 DIAGNOSIS — H409 Unspecified glaucoma: Secondary | ICD-10-CM | POA: Diagnosis not present

## 2022-10-16 DIAGNOSIS — G08 Intracranial and intraspinal phlebitis and thrombophlebitis: Secondary | ICD-10-CM | POA: Diagnosis not present

## 2022-10-16 DIAGNOSIS — Z993 Dependence on wheelchair: Secondary | ICD-10-CM

## 2022-10-16 DIAGNOSIS — Z794 Long term (current) use of insulin: Secondary | ICD-10-CM

## 2022-10-16 DIAGNOSIS — Z9181 History of falling: Secondary | ICD-10-CM

## 2022-10-17 ENCOUNTER — Telehealth: Payer: Self-pay | Admitting: *Deleted

## 2022-10-17 DIAGNOSIS — G060 Intracranial abscess and granuloma: Secondary | ICD-10-CM | POA: Diagnosis not present

## 2022-10-17 NOTE — Telephone Encounter (Signed)
I connected with Hunter Chang  on 7/22 at 1539 by telephone and verified that I am speaking with the correct person using two identifiers. According to the patient's chart they are due for follow up with [facility]. Pt scheduled. There are not transportation issues at this time. Nothing further was needed at the end of our conversation.

## 2022-10-18 ENCOUNTER — Encounter: Payer: Self-pay | Admitting: Internal Medicine

## 2022-10-18 DIAGNOSIS — D496 Neoplasm of unspecified behavior of brain: Secondary | ICD-10-CM | POA: Diagnosis not present

## 2022-10-18 DIAGNOSIS — D485 Neoplasm of uncertain behavior of skin: Secondary | ICD-10-CM | POA: Diagnosis not present

## 2022-10-18 LAB — LAB REPORT - SCANNED: EGFR: 71

## 2022-10-19 ENCOUNTER — Encounter: Payer: Self-pay | Admitting: Internal Medicine

## 2022-10-20 DIAGNOSIS — Z9889 Other specified postprocedural states: Secondary | ICD-10-CM | POA: Diagnosis not present

## 2022-10-20 DIAGNOSIS — G060 Intracranial abscess and granuloma: Secondary | ICD-10-CM | POA: Insufficient documentation

## 2022-10-20 DIAGNOSIS — L98494 Non-pressure chronic ulcer of skin of other sites with necrosis of bone: Secondary | ICD-10-CM | POA: Diagnosis not present

## 2022-10-20 DIAGNOSIS — Z79899 Other long term (current) drug therapy: Secondary | ICD-10-CM | POA: Diagnosis not present

## 2022-10-20 DIAGNOSIS — G0490 Encephalitis and encephalomyelitis, unspecified: Secondary | ICD-10-CM | POA: Diagnosis not present

## 2022-10-20 DIAGNOSIS — D485 Neoplasm of uncertain behavior of skin: Secondary | ICD-10-CM | POA: Diagnosis not present

## 2022-10-20 DIAGNOSIS — D496 Neoplasm of unspecified behavior of brain: Secondary | ICD-10-CM | POA: Diagnosis not present

## 2022-10-20 DIAGNOSIS — D4819 Other specified neoplasm of uncertain behavior of connective and other soft tissue: Secondary | ICD-10-CM | POA: Diagnosis not present

## 2022-10-20 DIAGNOSIS — G062 Extradural and subdural abscess, unspecified: Secondary | ICD-10-CM | POA: Diagnosis not present

## 2022-10-20 DIAGNOSIS — G08 Intracranial and intraspinal phlebitis and thrombophlebitis: Secondary | ICD-10-CM | POA: Diagnosis not present

## 2022-10-20 DIAGNOSIS — Z923 Personal history of irradiation: Secondary | ICD-10-CM | POA: Diagnosis not present

## 2022-10-20 DIAGNOSIS — Z792 Long term (current) use of antibiotics: Secondary | ICD-10-CM | POA: Diagnosis not present

## 2022-10-22 DIAGNOSIS — G060 Intracranial abscess and granuloma: Secondary | ICD-10-CM | POA: Diagnosis not present

## 2022-10-26 DIAGNOSIS — E119 Type 2 diabetes mellitus without complications: Secondary | ICD-10-CM | POA: Diagnosis not present

## 2022-10-26 DIAGNOSIS — G9389 Other specified disorders of brain: Secondary | ICD-10-CM | POA: Diagnosis not present

## 2022-10-26 DIAGNOSIS — G060 Intracranial abscess and granuloma: Secondary | ICD-10-CM | POA: Diagnosis not present

## 2022-10-26 DIAGNOSIS — R079 Chest pain, unspecified: Secondary | ICD-10-CM | POA: Diagnosis not present

## 2022-10-26 DIAGNOSIS — Z7901 Long term (current) use of anticoagulants: Secondary | ICD-10-CM | POA: Diagnosis not present

## 2022-10-27 DIAGNOSIS — R079 Chest pain, unspecified: Secondary | ICD-10-CM | POA: Diagnosis not present

## 2022-10-28 ENCOUNTER — Encounter: Payer: Self-pay | Admitting: *Deleted

## 2022-10-28 ENCOUNTER — Telehealth: Payer: Self-pay | Admitting: *Deleted

## 2022-10-28 DIAGNOSIS — G062 Extradural and subdural abscess, unspecified: Secondary | ICD-10-CM | POA: Diagnosis not present

## 2022-10-28 DIAGNOSIS — G0490 Encephalitis and encephalomyelitis, unspecified: Secondary | ICD-10-CM | POA: Diagnosis not present

## 2022-10-28 DIAGNOSIS — L98494 Non-pressure chronic ulcer of skin of other sites with necrosis of bone: Secondary | ICD-10-CM | POA: Diagnosis not present

## 2022-10-28 DIAGNOSIS — Z792 Long term (current) use of antibiotics: Secondary | ICD-10-CM | POA: Diagnosis not present

## 2022-10-28 DIAGNOSIS — G060 Intracranial abscess and granuloma: Secondary | ICD-10-CM | POA: Diagnosis not present

## 2022-10-28 NOTE — Transitions of Care (Post Inpatient/ED Visit) (Signed)
10/28/2022  Name: Hunter Chang MRN: [id number] DOB: 14-Jul-1945  Today's TOC FU Call Status: Today's TOC FU Call Status:: Successful TOC FU Call Completed TOC FU Call Complete Date: 10/28/22  Transition Care Management Follow-up Telephone Call Date of Discharge: 10/27/22 Discharge Facility: Other ([facility]) Name of Other ([facility]) Discharge Facility: [facility] Type of Discharge: Inpatient Admission Primary Inpatient Discharge Diagnosis:: Brain abscess with needle biopsy How have you been since you were released from the hospital?: Better (per daughter: "Things are going okay; I think he answered your phone call and then fell back asleep; both he and my mother are taking a nap, we are all just exhausted after the hospital visit") Any questions or concerns?: No  Items Reviewed: Did you receive and understand the discharge instructions provided?: Yes (briefly reviewed with patient who verbalizes good understanding of same - outside hospital AVS) Medications obtained,verified, and reconciled?: No (daughter reports that spouse manages medications and she denies questions/ concerns around medications today) Medications Not Reviewed Reasons:: Other: (daughter declined: states her mother manages all aspects of medication administration and mother (patient's spouse) is currently resting)) Any new allergies since your discharge?: No Dietary orders reviewed?: Yes Type of Diet Ordered:: "Regular" Do you have support at home?: Yes People in Home: spouse Name of Support/Comfort Primary Source: Daughter reports patient is independent in some self-care activities; supportive spouse assists as/ if needed/ indicated and supervises all care needs; daughter lives in [place] and comes in once a month to assist as needed  Medications Reviewed Today: Medications Reviewed Today     Reviewed by Michaela Corner, RN (Registered Nurse) on 10/28/22 at 1532  Med List Status: <None>   Medication  Order Taking? Sig Documenting Provider Last Dose Status Informant  apixaban (ELIQUIS) 5 MG TABS tablet [id number]  Take 1 tablet (5 mg total) by mouth 2 (two) times daily. Take 5 mg by mouth 2 (two) times daily. Pincus Sanes, MD  Active            Med Note Michaela Corner   Fri Oct 28, 2022  3:32 PM) 10/28/22: daughter Baxter Hire unable to review meds during [facility] call  aspirin EC 81 MG tablet [id number]  Take 1 tablet by mouth daily. [provider]  Active   BD PEN NEEDLE NANO 2ND GEN 32G X 4 MM MISC [id number]  USE AS DIRECTED WITH INSULIN PENS [provider]  Active   Calcium Carb-Cholecalciferol (OYSTER SHELL CALCIUM W/D) 500-5 MG-MCG TABS [id number]  Take 1 tablet by mouth daily. [provider]  Active   carbamazepine (TEGRETOL XR) 200 MG 12 hr tablet [id number]   [provider]  Active   Cholecalciferol 100 MCG (4000 UT) TABS [id number]  Take by mouth. [provider]  Active   Continuous Blood Gluc Receiver (FREESTYLE LIBRE 2 READER) DEVI [id number]  Scan as needed for continuous glucose monitoring. [provider]  Active   Continuous Blood Gluc Sensor (FREESTYLE LIBRE 2 SENSOR) [place] [id number]  Apply topically. [provider]  Active   Cyanocobalamin 5000 MCG CAPS [id number]  Take 1 capsule by mouth daily. [provider]  Active   dexamethasone (DECADRON) 2 MG tablet [id number]  Take by mouth. [provider]  Active   dexamethasone (DECADRON) 4 MG tablet [id number]  Take 4 mg by mouth 4 (four) times daily. [provider]  Active   doxycycline (VIBRA-TABS) 100 MG tablet [id number]  SMARTSIG:1 Tablet(s) By Mouth Every 12 Hours [provider]  Active   HUMALOG KWIKPEN 100 UNIT/ML KwikPen [id number]  Inject into the skin 4 (four) times daily. [provider]  Active   hydrocortisone 2.5 % cream [id number]  SMARTSIG:Sparingly Topical Twice Daily [provider]  Active   insulin glargine  (LANTUS) 100 UNIT/ML injection [id number]  Inject into the skin. [provider]  Active   insulin lispro (HUMALOG) 100 UNIT/ML injection [id number]  Inject into the skin. [provider]  Active   LANTUS SOLOSTAR 100 UNIT/ML Solostar Pen [id number]  SMARTSIG:15 Unit(s) SUB-Q Every Night [provider]  Active   latanoprost (XALATAN) 0.005 % ophthalmic solution [id number] No Place 1 drop into both eyes at bedtime. [provider] Taking Active Self, Spouse/Significant Other, Pharmacy Records  levETIRAcetam (KEPPRA) 500 MG tablet [id number] No Take 1 tablet (500 mg total) by mouth 2 (two) times daily. Dimple Nanas, MD Taking Active   LORazepam (ATIVAN) 1 MG tablet [id number] No Take 1 tablet (1 mg total) by mouth 3 (three) times daily as needed for up to 15 doses for seizure. Terald Sleeper, MD Taking Active   magnesium gluconate (MAGONATE) 500 MG tablet [id number] No Take 500 mg by mouth daily. [provider] Taking Active Self, Spouse/Significant Other, Pharmacy Records           Med Note Nedra Hai, Maryann Alar   Fri Oct 08, 2021  8:04 AM) Patient is unsure if he is taking this medication.  meropenem (MERREM) 1 g injection [id number]  Inject into the vein. [provider]  Active   Multiple Vitamins-Minerals (ONE-A-DAY MENS 50+ ADVANTAGE) TABS [id number] No Take 1 tablet by mouth daily. [provider] Taking Active Self, Spouse/Significant Other, Pharmacy Records  Multiple Vitamins-Minerals (ZINC PO) [id number] No Take 50 mg by mouth daily. [provider] Taking Active Self, Spouse/Significant Other, Pharmacy Records  nystatin ointment (MYCOSTATIN) [id number]  Apply topically. [provider]  Active   omeprazole (PRILOSEC) 20 MG capsule [id number]  Take 1 capsule (20 mg total) by mouth daily. Take 30 minutes prior to a meal Burns, Bobette Mo, MD  Active   potassium chloride (KLOR-CON) 10 MEQ tablet [id number]  Take by mouth.  [provider]  Active   predniSONE (DELTASONE) 20 MG tablet [id number] No Take 20 mg by mouth daily with breakfast. [provider] Taking Active   senna-docusate (SENOKOT-S) 8.6-50 MG tablet [id number]  Take by mouth. [provider]  Active   sodium chloride irrigation 0.9 % irrigation [id number]  Use for wet to dry scalp dressings BID [provider]  Active   sulfamethoxazole-trimethoprim (BACTRIM DS) 800-160 MG tablet [id number]  Take 1 tablet by mouth 3 (three) times a week. [provider]  Active   vancomycin (VANCOCIN) 1 g injection [id number]  SMARTSIG:IV [provider]  Active   vancomycin (VANCOCIN) 10 G SOLR injection [id number]  Inject into the vein. [provider]  Active   VITAMIN D PO [id number] No Take 1 tablet by mouth daily. [provider] Taking Active Self, Spouse/Significant Other, Pharmacy Records  zonisamide (ZONEGRAN) 100 MG capsule [id number]  TAKE 2 CAPSULES AT BEDTIME FOR SEIZURES Burns, Bobette Mo, MD  Active            Home Care and Equipment/Supplies: Were Home Health Services Ordered?: Yes Name of Home Health Agency:: [facility]: Daughter reports has been in place "for quite awhile;" states they are coming once a week to manage PICC line site; reports patient also has bath aide twice  a week; [facility] handling IV medications- spouse adminsiters: antibiotics to continue until "at least 11/17/22" per daughter report Has Agency set up a time to come to your home?: Yes First Home Health Visit Date:  (daughter unsure: confirms that services are in place and established from prior hospital visits at [facility]) Any new equipment or medical supplies ordered?: No  Functional Questionnaire: Do you need assistance with bathing/showering or dressing?: Yes (spouse manages and supervises all aspects of patient's care needs; patient assists as allowed) Do you need assistance with meal preparation?: Yes  (spouse manages and supervises all aspects of patient's care needs; patient assists as allowed) Do you need assistance with eating?: No Do you have difficulty maintaining continence: No (spouse manages and supervises all aspects of patient's care needs; patient assists as allowed) Do you need assistance with getting out of bed/getting out of a chair/moving?: Yes (spouse manages and supervises all aspects of patient's care needs; patient assists as allowed) Do you have difficulty managing or taking your medications?: Yes (spouse manages all aspects medication administration)  Follow up appointments reviewed: PCP Follow-up appointment confirmed?: Yes Date of PCP follow-up appointment?: 11/04/22 Follow-up Provider: PCP Specialist Hospital Follow-up appointment confirmed?: Yes Date of Specialist follow-up appointment?: 10/28/22 Follow-Up Specialty Provider:: ID provider at [facility] telephone visit earlier today, 10/28/22; daughter reports has neurology and ID in person visits scheduled at [facility]-- not sure of exact dates Do you need transportation to your follow-up appointment?: No Do you understand care options if your condition(s) worsen?: Yes-patient verbalized understanding  SDOH Interventions Today    Flowsheet Row Most Recent Value  SDOH Interventions   Food Insecurity Interventions Intervention Not Indicated  Transportation Interventions Intervention Not Indicated  [spouse provides transportation]      TOC Interventions Today    Flowsheet Row Most Recent Value  TOC Interventions   TOC Interventions Discussed/Reviewed TOC Interventions Discussed  [provided my direct contact information should questions/ concerns/ needs arise post-TOC call, prior to RN CM telephone visit 11/17/22]      Interventions Today    Flowsheet Row Most Recent Value  Chronic Disease   Chronic disease during today's visit Other  [brain abscess with needle biopsy]  General Interventions   General Interventions  Discussed/Reviewed General Interventions Discussed, Doctor Visits, Referral to Nurse, Durable Medical Equipment (DME)  Doctor Visits Discussed/Reviewed Doctor Visits Discussed, Doctor Visits Reviewed, PCP, Specialist  [scheduled with RN CM Care Coordinator for follow up telephone visit on 11/17/22]  Durable Medical Equipment (DME) Dan Humphreys, Other  [confirmed currently requiring/ using assistive devices-- currently using transfer chair due to loss of use of (L) hand- can't use walker]  PCP/Specialist Visits Compliance with follow-up visit  Nutrition Interventions   Nutrition Discussed/Reviewed Nutrition Discussed  Pharmacy Interventions   Pharmacy Dicussed/Reviewed Pharmacy Topics Discussed  Safety Interventions   Safety Discussed/Reviewed Safety Discussed      Caryl Pina, RN, BSN, CCRN Alumnus RN CM Care Coordination/ Transition of Care- [facility] Care Management [phone]: direct office

## 2022-10-29 DIAGNOSIS — M869 Osteomyelitis, unspecified: Secondary | ICD-10-CM | POA: Diagnosis not present

## 2022-10-31 DIAGNOSIS — G08 Intracranial and intraspinal phlebitis and thrombophlebitis: Secondary | ICD-10-CM | POA: Diagnosis not present

## 2022-10-31 DIAGNOSIS — G060 Intracranial abscess and granuloma: Secondary | ICD-10-CM | POA: Diagnosis not present

## 2022-10-31 DIAGNOSIS — Z7952 Long term (current) use of systemic steroids: Secondary | ICD-10-CM | POA: Diagnosis not present

## 2022-11-01 ENCOUNTER — Telehealth: Payer: Self-pay | Admitting: Internal Medicine

## 2022-11-01 LAB — LAB REPORT - SCANNED: EGFR: 82

## 2022-11-01 NOTE — Telephone Encounter (Signed)
FYI..the patient appointment was change to a video visit for the pt due to pt was unable to come in office.

## 2022-11-02 ENCOUNTER — Encounter: Payer: Self-pay | Admitting: Internal Medicine

## 2022-11-02 DIAGNOSIS — G060 Intracranial abscess and granuloma: Secondary | ICD-10-CM | POA: Diagnosis not present

## 2022-11-02 DIAGNOSIS — L98494 Non-pressure chronic ulcer of skin of other sites with necrosis of bone: Secondary | ICD-10-CM | POA: Diagnosis not present

## 2022-11-02 DIAGNOSIS — G9340 Encephalopathy, unspecified: Secondary | ICD-10-CM | POA: Diagnosis not present

## 2022-11-02 DIAGNOSIS — Z792 Long term (current) use of antibiotics: Secondary | ICD-10-CM | POA: Diagnosis not present

## 2022-11-02 DIAGNOSIS — G062 Extradural and subdural abscess, unspecified: Secondary | ICD-10-CM | POA: Diagnosis not present

## 2022-11-02 DIAGNOSIS — L929 Granulomatous disorder of the skin and subcutaneous tissue, unspecified: Secondary | ICD-10-CM | POA: Diagnosis not present

## 2022-11-03 ENCOUNTER — Encounter: Payer: Self-pay | Admitting: Internal Medicine

## 2022-11-03 DIAGNOSIS — Z7409 Other reduced mobility: Secondary | ICD-10-CM | POA: Insufficient documentation

## 2022-11-03 NOTE — Progress Notes (Addendum)
Virtual Visit via Video Note  I connected with Hunter Chang on 11/03/22 at  2:00 PM EDT by a video enabled telemedicine application and verified that I am speaking with the correct person using two identifiers.   I discussed the limitations of evaluation and management by telemedicine and the availability of in person appointments. The patient expressed understanding and agreed to proceed.  Present for the visit:  Myself, Dr Cheryll Cockayne, Hunter Chang and his wife Hunter Chang who helps provide most of the history.  The patient is currently at home and I am in the office.    No referring provider.    History of Present Illness: He is here for follow up of his chronic medical conditions.     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   PT, OT and nurse Left side very weak - esp left arm/hand-needs assistance with ADLs. He is not able to get up out of a chair on his own and requires assistance with transfers.  He has required more assistance with his ADLs Appetite is good  Sugar - 109-150 at home.  Review of Systems  Constitutional:  Positive for malaise/fatigue. Negative for fever.       Appetite good  Respiratory:  Negative for cough, shortness of breath and wheezing.   Cardiovascular:  Negative for chest pain, palpitations and leg swelling.  Gastrointestinal:  Negative for abdominal pain, constipation, diarrhea and nausea.  Neurological:  Positive for headaches (mild once - none since). Negative for dizziness and  tingling.  Psychiatric/Behavioral:  The patient does not have insomnia.       Social History   Socioeconomic History   Marital status: Married    Spouse name: Not on file   Number of children: Not on file   Years of education: Not on file   Highest education level: Not on file  Occupational History   Not on file  Tobacco Use   Smoking status: Never   Smokeless tobacco: Never  Vaping Use   Vaping status: Never Used  Substance and Sexual Activity   Alcohol use: No   Drug use: No   Sexual activity: Yes  Other Topics Concern   Not on file  Social History Narrative   Not on file   Social Determinants of Health   Financial Resource Strain: Low Risk  (12/12/2020)   Overall Financial Resource Strain (CARDIA)    Difficulty of Paying Living Expenses: Not hard at all  Food Insecurity: No Food Insecurity (10/28/2022)   Hunger Vital Sign    Worried About Running Out of Food in the Last Year: Never true    Ran Out of Food in the Last Year: Never true  Transportation Needs: No Transportation Needs (10/28/2022)   PRAPARE - Administrator, Civil Service (Medical): No    Lack of Transportation (Non-Medical): No  Physical Activity: Sufficiently Active (12/12/2020)   Exercise Vital Sign    Days of Exercise per Week: 5 days  Minutes of Exercise per Session: 50 min  Stress: No Stress Concern Present (12/12/2020)   [employer] of Occupational Health - Occupational Stress Questionnaire    Feeling of Stress : Not at all  Social Connections: Socially Integrated (12/12/2020)   Social Connection and Isolation Panel [NHANES]    Frequency of Communication with Friends and Family: More than three times a week    Frequency of Social Gatherings with Friends and Family: More than three times a week    Attends Religious Services: More than 4 times per year    Active Member of [employer] or Organizations: Yes    Attends Engineer, structural: More than 4 times per year    Marital  Status: Married     Observations/Objective: Appears well in NAD Breathing normally  Assessment and Plan:  See Problem List for Assessment and Plan of chronic medical problems.   He is requiring more assistance with ADLs.  He is not able to transfer, ambulate without assistance He would benefit from a lift chair which would help his wife to help him out of a chair.   Follow Up Instructions:    I discussed the assessment and treatment plan with the patient. The patient was provided an opportunity to ask questions and all were answered. The patient agreed with the plan and demonstrated an understanding of the instructions.   The patient was advised to call back or seek an in-person evaluation if the symptoms worsen or if the condition fails to improve as anticipated.    Pincus Sanes, MD

## 2022-11-04 ENCOUNTER — Telehealth (INDEPENDENT_AMBULATORY_CARE_PROVIDER_SITE_OTHER): Payer: PPO | Admitting: Internal Medicine

## 2022-11-04 DIAGNOSIS — E876 Hypokalemia: Secondary | ICD-10-CM | POA: Diagnosis not present

## 2022-11-04 DIAGNOSIS — D485 Neoplasm of uncertain behavior of skin: Secondary | ICD-10-CM | POA: Diagnosis not present

## 2022-11-04 DIAGNOSIS — Z794 Long term (current) use of insulin: Secondary | ICD-10-CM

## 2022-11-04 DIAGNOSIS — G060 Intracranial abscess and granuloma: Secondary | ICD-10-CM

## 2022-11-04 DIAGNOSIS — E119 Type 2 diabetes mellitus without complications: Secondary | ICD-10-CM | POA: Diagnosis not present

## 2022-11-04 DIAGNOSIS — G08 Intracranial and intraspinal phlebitis and thrombophlebitis: Secondary | ICD-10-CM

## 2022-11-04 MED ORDER — POTASSIUM CHLORIDE CRYS ER 20 MEQ PO TBCR: 20.0000 meq | EXTENDED_RELEASE_TABLET | Freq: Every day | ORAL | 3 refills | Status: DC

## 2022-11-04 NOTE — Assessment & Plan Note (Signed)
Diagnosed 08/2022 Continue Eliquis-continue 5 mg twice daily

## 2022-11-04 NOTE — Assessment & Plan Note (Signed)
Secondary to atypical fibroxanthoma and chronic wound Biopsy, aspiration 7/31 Management per [facility] ID Currently on vancomycin, meropenem and Flagyl

## 2022-11-04 NOTE — Assessment & Plan Note (Signed)
Chronic Has had slightly low potassium for at least a couple of months based on blood work done through [facility] potassium chloride 20 mill equivalents daily Will get repeat blood work done through [facility]

## 2022-11-04 NOTE — Assessment & Plan Note (Signed)
Chronic Secondary to steroid use Sugars at home have been controlled-running 10 9-1 50 for the most part Continue Lantus 15 units nightly, Humalog

## 2022-11-05 DIAGNOSIS — M869 Osteomyelitis, unspecified: Secondary | ICD-10-CM | POA: Diagnosis not present

## 2022-11-07 DIAGNOSIS — Z792 Long term (current) use of antibiotics: Secondary | ICD-10-CM | POA: Diagnosis not present

## 2022-11-07 DIAGNOSIS — G060 Intracranial abscess and granuloma: Secondary | ICD-10-CM | POA: Diagnosis not present

## 2022-11-10 DIAGNOSIS — G060 Intracranial abscess and granuloma: Secondary | ICD-10-CM | POA: Diagnosis not present

## 2022-11-12 DIAGNOSIS — M869 Osteomyelitis, unspecified: Secondary | ICD-10-CM | POA: Diagnosis not present

## 2022-11-14 DIAGNOSIS — G060 Intracranial abscess and granuloma: Secondary | ICD-10-CM | POA: Diagnosis not present

## 2022-11-15 ENCOUNTER — Ambulatory Visit (INDEPENDENT_AMBULATORY_CARE_PROVIDER_SITE_OTHER): Payer: PPO

## 2022-11-15 VITALS — Ht 68.0 in | Wt 152.0 lb

## 2022-11-15 DIAGNOSIS — Z0000 Encounter for general adult medical examination without abnormal findings: Secondary | ICD-10-CM | POA: Diagnosis not present

## 2022-11-15 NOTE — Progress Notes (Signed)
Subjective:   Hunter Chang is a 77 y.o. male who presents for Medicare Annual/Subsequent preventive examination.  Visit Complete: Virtual  I connected with  Hunter Chang on 11/15/22 by a audio enabled telemedicine application and verified that I am speaking with the correct person using two identifiers.  Patient Location: Home  Provider Location: Office/Clinic  I discussed the limitations of evaluation and management by telemedicine. The patient expressed understanding and agreed to proceed.  Patient Medicare AWV questionnaire was completed by the patient on 11/14/2022; I have confirmed that all information answered by patient is correct and no changes since this date.  Vital Signs: Because this visit was a virtual/telehealth visit, some criteria may be missing or patient reported. Any vitals not documented were not able to be obtained and vitals that have been documented are patient reported.    Review of Systems     Cardiac Risk Factors include: advanced age (>80men, >47 women);family history of premature cardiovascular disease;male gender;sedentary lifestyle     Objective:    Today's Vitals   11/15/22 1133 11/15/22 1134  Weight: 152 lb (68.9 kg)   Height: 5\' 8"  (1.727 m)   PainSc: 0-No pain 0-No pain   Body mass index is 23.11 kg/m.     11/15/2022   11:57 AM 01/24/2022    9:33 AM 11/30/2021   10:59 PM 10/21/2021   11:58 AM 10/08/2021    3:00 PM 09/26/2021    9:21 AM 04/19/2021   11:09 AM  Advanced Directives  Does Patient Have a Medical Advance Directive? No Yes No Yes Yes Yes Yes  Type of Advance Directive  Living will  Living will Living will  Healthcare Power of [place];Living will  Does patient want to make changes to medical advance directive?  No - Patient declined   No - Patient declined No - Patient declined   Copy of Healthcare Power of Attorney in Chart?       No - copy requested  Would patient like information on creating a medical advance directive? No -  Patient declined          Current Medications (verified) Outpatient Encounter Medications as of 11/15/2022  Medication Sig   apixaban (ELIQUIS) 5 MG TABS tablet Take 1 tablet (5 mg total) by mouth 2 (two) times daily. Take 5 mg by mouth 2 (two) times daily.   aspirin EC 81 MG tablet Take 1 tablet by mouth daily.   Cholecalciferol 100 MCG (4000 UT) TABS Take by mouth.   Cyanocobalamin 5000 MCG CAPS Take 1 capsule by mouth daily.   hydrocortisone 2.5 % cream SMARTSIG:Sparingly Topical Twice Daily   latanoprost (XALATAN) 0.005 % ophthalmic solution Place 1 drop into both eyes at bedtime.   levETIRAcetam (KEPPRA) 500 MG tablet Take 1 tablet (500 mg total) by mouth 2 (two) times daily.   magnesium gluconate (MAGONATE) 500 MG tablet Take 500 mg by mouth daily.   meropenem (MERREM) 1 g injection Inject into the vein.   metroNIDAZOLE (FLAGYL) 500 MG tablet Take by mouth.   Multiple Vitamins-Minerals (ONE-A-DAY MENS 50+ ADVANTAGE) TABS Take 1 tablet by mouth daily.   Multiple Vitamins-Minerals (ZINC PO) Take 50 mg by mouth daily.   omeprazole (PRILOSEC) 20 MG capsule Take 1 capsule (20 mg total) by mouth daily. Take 30 minutes prior to a meal   potassium chloride SA (KLOR-CON M) 20 MEQ tablet Take 1 tablet (20 mEq total) by mouth daily.   predniSONE (DELTASONE) 20 MG tablet Take 20  mg by mouth daily with breakfast.   sodium chloride irrigation 0.9 % irrigation Use for wet to dry scalp dressings BID   sulfamethoxazole-trimethoprim (BACTRIM DS) 800-160 MG tablet Take 1 tablet by mouth 3 (three) times a week.   vancomycin (VANCOCIN) 1 g injection    vancomycin (VANCOCIN) 10 G SOLR injection Inject into the vein.   VITAMIN D PO Take 1 tablet by mouth daily.   zonisamide (ZONEGRAN) 100 MG capsule TAKE 2 CAPSULES AT BEDTIME FOR SEIZURES   [DISCONTINUED] BD PEN NEEDLE NANO 2ND GEN 32G X 4 MM MISC USE AS DIRECTED WITH INSULIN PENS   [DISCONTINUED] Continuous Blood Gluc Receiver (FREESTYLE LIBRE 2 READER)  DEVI Scan as needed for continuous glucose monitoring.   [DISCONTINUED] Continuous Blood Gluc Sensor (FREESTYLE LIBRE 2 SENSOR) MISC Apply topically.   [DISCONTINUED] HUMALOG KWIKPEN 100 UNIT/ML KwikPen Inject into the skin 4 (four) times daily.   [DISCONTINUED] LANTUS SOLOSTAR 100 UNIT/ML Solostar Pen SMARTSIG:15 Unit(s) SUB-Q Every Night   [DISCONTINUED] senna-docusate (SENOKOT-S) 8.6-50 MG tablet Take by mouth.   No facility-administered encounter medications on file as of 11/15/2022.    Allergies (verified) Cefepime, Pembrolizumab, Succinylcholine chloride, Valproic acid, and Chlorhexidine gluconate   History: Past Medical History:  Diagnosis Date   Arthritis    Chicken pox    [place] measles    Trigeminal neuralgia    Past Surgical History:  Procedure Laterality Date   HERNIA REPAIR     3 times   Family History  Problem Relation Age of Onset   Arthritis Mother    Diabetes Father    Early death Father    Heart disease Father    Heart attack Father    Early death Maternal Grandfather    COPD Paternal Grandfather    Social History   Socioeconomic History   Marital status: Married    Spouse name: Not on file   Number of children: Not on file   Years of education: Not on file   Highest education level: Not on file  Occupational History   Not on file  Tobacco Use   Smoking status: Never   Smokeless tobacco: Never  Vaping Use   Vaping status: Never Used  Substance and Sexual Activity   Alcohol use: No   Drug use: No   Sexual activity: Yes  Other Topics Concern   Not on file  Social History Narrative   Not on file   Social Determinants of Health   Financial Resource Strain: Low Risk  (11/15/2022)   Overall Financial Resource Strain (CARDIA)    Difficulty of Paying Living Expenses: Not hard at all  Food Insecurity: No Food Insecurity (11/15/2022)   Hunger Vital Sign    Worried About Running Out of Food in the Last Year: Never true    Ran Out of Food in the  Last Year: Never true  Transportation Needs: No Transportation Needs (11/15/2022)   PRAPARE - Administrator, Civil Service (Medical): No    Lack of Transportation (Non-Medical): No  Physical Activity: Inactive (11/15/2022)   Exercise Vital Sign    Days of Exercise per Week: 0 days    Minutes of Exercise per Session: 0 min  Stress: No Stress Concern Present (11/15/2022)   [employer] of Occupational Health - Occupational Stress Questionnaire    Feeling of Stress : Not at all  Social Connections: Unknown (11/15/2022)   Social Connection and Isolation Panel [NHANES]    Frequency of Communication with Friends and Family:  More than three times a week    Frequency of Social Gatherings with Friends and Family: Twice a week    Attends Religious Services: Patient unable to answer    Active Member of Clubs or Organizations: Yes    Attends Banker Meetings: Never    Marital Status: Married    Tobacco Counseling Counseling given: Not Answered   Clinical Intake:  Pre-visit preparation completed: Yes  Pain : No/denies pain Pain Score: 0-No pain     BMI - recorded: 23.11 Nutritional Status: BMI of 19-24  Normal Nutritional Risks: None Diabetes: No  How often do you need to have someone help you when you read instructions, pamphlets, or other written materials from your doctor or pharmacy?: 1 - Never What is the last grade level you completed in school?: HSG  Interpreter Needed?: No  Information entered by :: Desiray Orchard N. Luvia Orzechowski, LPN.   Activities of Daily Living    11/15/2022   11:37 AM 11/14/2022    9:18 PM  In your present state of health, do you have any difficulty performing the following activities:  Hearing? 0 0  Vision? 0 0  Difficulty concentrating or making decisions? 0 0  Walking or climbing stairs? 1 1  Dressing or bathing? 1 1  Doing errands, shopping? 1 1  Preparing Food and eating ? Y Y  Using the Toilet? Y Y  In the past six  months, have you accidently leaked urine? N N  Do you have problems with loss of bowel control? N N  Managing your Medications? N N  Managing your Finances? N N  Housekeeping or managing your Housekeeping? Malvin Johns    Patient Care Team: Pincus Sanes, MD as PCP - General (Internal Medicine) Dermatology, [facility], Lise Auer, RN as Oncology Nurse Navigator Lonie Peak, MD as Consulting Physician (Radiation Oncology)  Indicate any recent Medical Services you may have received from other than [facility] providers in the past year (date may be approximate).     Assessment:   This is a routine wellness examination for Hunter Chang.  Hearing/Vision screen Hearing Screening - Comments:: Patient denied any hearing difficulty.   No hearing aids.  Vision Screening - Comments:: Annual eye exam done by: Sinda Du, MD.   Dietary issues and exercise activities discussed:     Goals Addressed             This Visit's Progress    Client understands the importance of follow-up with providers by attending scheduled visits        Depression Screen    11/15/2022   11:47 AM 03/10/2022    8:42 AM 03/10/2022    8:41 AM 10/22/2021    1:11 PM 09/29/2021   10:49 AM 12/12/2020    9:27 AM 10/23/2020   11:48 AM  PHQ 2/9 Scores  PHQ - 2 Score 0 0 0 0 0 0 0  PHQ- 9 Score 0 2  2   1     Fall Risk    11/15/2022   11:40 AM 11/14/2022    9:18 PM 03/10/2022    8:42 AM 10/22/2021    1:12 PM 09/29/2021   10:49 AM  Fall Risk   Falls in the past year? 1 1 0 1 1  Number falls in past yr: 1 1 0 1 0  Injury with Fall? 0 0 0 0 0  Risk for fall due to : History of fall(s);Impaired balance/gait;Impaired mobility;Orthopedic patient  No Fall Risks No Fall Risks  No Fall Risks  Follow up   Falls evaluation completed Falls evaluation completed Falls evaluation completed    MEDICARE RISK AT HOME: Medicare Risk at Home Any stairs in or around the home?: No If so, are there any without handrails?: No Home  free of loose throw rugs in walkways, pet beds, electrical cords, etc?: Yes Adequate lighting in your home to reduce risk of falls?: Yes Life alert?: No Use of a cane, walker or w/c?: Yes Grab bars in the bathroom?: Yes Shower chair or bench in shower?: Yes Elevated toilet seat or a handicapped toilet?: Yes  TIMED UP AND GO:  Was the test performed?  No    Cognitive Function:    03/03/2017    1:52 PM  MMSE - Mini Mental State Exam  Orientation to time 5  Orientation to Place 5  Registration 3  Attention/ Calculation 5  Recall 3  Language- name 2 objects 2  Language- repeat 1  Language- follow 3 step command 3  Language- read & follow direction 1  Write a sentence 1  Copy design 1  Total score 30        11/15/2022   12:00 PM 12/12/2020    9:31 AM  6CIT Screen  What Year? 0 points 0 points  What month? 0 points 0 points  What time? 0 points 0 points  Count back from 20 0 points 0 points  Months in reverse 0 points 0 points  Repeat phrase 0 points 0 points  Total Score 0 points 0 points    Immunizations Immunization History  Administered Date(s) Administered   Fluad Quad(high Dose 65+) 12/01/2018   Influenza, High Dose Seasonal PF 12/03/2017, 12/07/2020   Influenza-Unspecified 11/26/2016, 12/30/2017   Pneumococcal Conjugate-13 12/30/2016   Pneumococcal Polysaccharide-23 12/30/2017   Tdap 06/05/2012   Zoster Recombinant(Shingrix) 12/01/2018    TDAP status: Due, Education has been provided regarding the importance of this vaccine. Advised may receive this vaccine at local pharmacy or Health Dept. Aware to provide a copy of the vaccination record if obtained from local pharmacy or Health Dept. Verbalized acceptance and understanding.  Flu Vaccine status: Up to date  Pneumococcal vaccine status: Up to date  Covid-19 vaccine status: Declined, Education has been provided regarding the importance of this vaccine but patient still declined. Advised may receive this  vaccine at local pharmacy or Health Dept.or vaccine clinic. Aware to provide a copy of the vaccination record if obtained from local pharmacy or Health Dept. Verbalized acceptance and understanding.  Qualifies for Shingles Vaccine? Yes   Zostavax completed No   Shingrix Completed?: No.    Education has been provided regarding the importance of this vaccine. Patient has been advised to call insurance company to determine out of pocket expense if they have not yet received this vaccine. Advised may also receive vaccine at local pharmacy or Health Dept. Verbalized acceptance and understanding.  Screening Tests Health Maintenance  Topic Date Due   HEMOGLOBIN A1C  Never done   COVID-19 Vaccine (1) Never done   FOOT EXAM  Never done   OPHTHALMOLOGY EXAM  Never done   Diabetic kidney evaluation - Urine ACR  Never done   Hepatitis C Screening  Never done   Zoster Vaccines- Shingrix (2 of 2) 01/26/2019   DTaP/Tdap/Td (2 - Td or Tdap) 06/06/2022   INFLUENZA VACCINE  10/27/2022   Diabetic kidney evaluation - eGFR measurement  11/01/2023   Medicare Annual Wellness (AWV)  11/15/2023   Pneumonia Vaccine 65+  Years old  Completed   HPV VACCINES  Aged Out   Colonoscopy  Discontinued    Health Maintenance  Health Maintenance Due  Topic Date Due   HEMOGLOBIN A1C  Never done   COVID-19 Vaccine (1) Never done   FOOT EXAM  Never done   OPHTHALMOLOGY EXAM  Never done   Diabetic kidney evaluation - Urine ACR  Never done   Hepatitis C Screening  Never done   Zoster Vaccines- Shingrix (2 of 2) 01/26/2019   DTaP/Tdap/Td (2 - Td or Tdap) 06/06/2022   INFLUENZA VACCINE  10/27/2022    Colorectal cancer screening: No longer required.   Lung Cancer Screening: (Low Dose CT Chest recommended if Age 70-80 years, 20 pack-year currently smoking OR have quit w/in 15years.) does not qualify.   Lung Cancer Screening Referral: no  Additional Screening:  Hepatitis C Screening: does qualify; Completed:  no  Vision Screening: Recommended annual ophthalmology exams for early detection of glaucoma and other disorders of the eye. Is the patient up to date with their annual eye exam?  Yes  Who is the provider or what is the name of the office in which the patient attends annual eye exams? Sinda Du, MD If pt is not established with a provider, would they like to be referred to a provider to establish care? No .   Dental Screening: Recommended annual dental exams for proper oral hygiene  Diabetic Foot Exam: N/A  Community Resource Referral / Chronic Care Management: CRR required this visit?  No   CCM required this visit?  No     Plan:     I have personally reviewed and noted the following in the patient's chart:   Medical and social history Use of alcohol, tobacco or illicit drugs  Current medications and supplements including opioid prescriptions. Patient is not currently taking opioid prescriptions. Functional ability and status Nutritional status Physical activity Advanced directives List of other physicians Hospitalizations, surgeries, and ER visits in previous 12 months Vitals Screenings to include cognitive, depression, and falls Referrals and appointments  In addition, I have reviewed and discussed with patient certain preventive protocols, quality metrics, and best practice recommendations. A written personalized care plan for preventive services as well as general preventive health recommendations were provided to patient.     Mickeal Needy, LPN   9/81/1914   After Visit Summary: (Mail) Due to this being a telephonic visit, the after visit summary with patients personalized plan was offered to patient via mail   Nurse Notes: Normal cognitive status assessed by direct observation via telephone conversation by this Nurse Health Advisor. No abnormalities found.  Vital Signs: Because this visit was a virtual/telehealth visit, some criteria may be missing or  patient reported. Any vitals not documented were not able to be obtained and vitals that have been documented are patient reported.

## 2022-11-15 NOTE — Patient Instructions (Addendum)
Hunter Chang , Thank you for taking time to come for your Medicare Wellness Visit. I appreciate your ongoing commitment to your health goals. Please review the following plan we discussed and let me know if I can assist you in the future.   Referrals/Orders/Follow-Ups/Clinician Recommendations: No  This is a list of the screening recommended for you and due dates:  Health Maintenance  Topic Date Due   Hemoglobin A1C  Never done   COVID-19 Vaccine (1) Never done   Complete foot exam   Never done   Eye exam for diabetics  Never done   Yearly kidney health urinalysis for diabetes  Never done   Hepatitis C Screening  Never done   Zoster (Shingles) Vaccine (2 of 2) 01/26/2019   DTaP/Tdap/Td vaccine (2 - Td or Tdap) 06/06/2022   Flu Shot  10/27/2022   Yearly kidney function blood test for diabetes  11/01/2023   Medicare Annual Wellness Visit  11/15/2023   Pneumonia Vaccine  Completed   HPV Vaccine  Aged Out   Colon Cancer Screening  Discontinued    Advanced directives: (Declined) Advance directive discussed with you today. Even though you declined this today, please call our office should you change your mind, and we can give you the proper paperwork for you to fill out.  Next Medicare Annual Wellness Visit scheduled for next year: Yes  Preventive Care 27 Years and Older, Male  Preventive care refers to lifestyle choices and visits with your health care provider that can promote health and wellness. What does preventive care include? A yearly physical exam. This is also called an annual well check. Dental exams once or twice a year. Routine eye exams. Ask your health care provider how often you should have your eyes checked. Personal lifestyle choices, including: Daily care of your teeth and gums. Regular physical activity. Eating a healthy diet. Avoiding tobacco and drug use. Limiting alcohol use. Practicing safe sex. Taking low doses of aspirin every day. Taking vitamin and  mineral supplements as recommended by your health care provider. What happens during an annual well check? The services and screenings done by your health care provider during your annual well check will depend on your age, overall health, lifestyle risk factors, and family history of disease. Counseling  Your health care provider may ask you questions about your: Alcohol use. Tobacco use. Drug use. Emotional well-being. Home and relationship well-being. Sexual activity. Eating habits. History of falls. Memory and ability to understand (cognition). Work and work Astronomer. Screening  You may have the following tests or measurements: Height, weight, and BMI. Blood pressure. Lipid and cholesterol levels. These may be checked every 5 years, or more frequently if you are over 16 years old. Skin check. Lung cancer screening. You may have this screening every year starting at age 8 if you have a 30-pack-year history of smoking and currently smoke or have quit within the past 15 years. Fecal occult blood test (FOBT) of the stool. You may have this test every year starting at age 74. Flexible sigmoidoscopy or colonoscopy. You may have a sigmoidoscopy every 5 years or a colonoscopy every 10 years starting at age 77. Prostate cancer screening. Recommendations will vary depending on your family history and other risks. Hepatitis C blood test. Hepatitis B blood test. Sexually transmitted disease (STD) testing. Diabetes screening. This is done by checking your blood sugar (glucose) after you have not eaten for a while (fasting). You may have this done every 1-3 years. Abdominal aortic  aneurysm (AAA) screening. You may need this if you are a current or former smoker. Osteoporosis. You may be screened starting at age 47 if you are at high risk. Talk with your health care provider about your test results, treatment options, and if necessary, the need for more tests. Vaccines  Your health care  provider may recommend certain vaccines, such as: Influenza vaccine. This is recommended every year. Tetanus, diphtheria, and acellular pertussis (Tdap, Td) vaccine. You may need a Td booster every 10 years. Zoster vaccine. You may need this after age 16. Pneumococcal 13-valent conjugate (PCV13) vaccine. One dose is recommended after age 33. Pneumococcal polysaccharide (PPSV23) vaccine. One dose is recommended after age 103. Talk to your health care provider about which screenings and vaccines you need and how often you need them. This information is not intended to replace advice given to you by your health care provider. Make sure you discuss any questions you have with your health care provider. Document Released: 04/10/2015 Document Revised: 12/02/2015 Document Reviewed: 01/13/2015 Elsevier Interactive Patient Education  2017 [employer].  Fall Prevention in the Home Falls can cause injuries. They can happen to people of all ages. There are many things you can do to make your home safe and to help prevent falls. What can I do on the outside of my home? Regularly fix the edges of walkways and driveways and fix any cracks. Remove anything that might make you trip as you walk through a door, such as a raised step or threshold. Trim any bushes or trees on the path to your home. Use bright outdoor lighting. Clear any walking paths of anything that might make someone trip, such as rocks or tools. Regularly check to see if handrails are loose or broken. Make sure that both sides of any steps have handrails. Any raised decks and porches should have guardrails on the edges. Have any leaves, snow, or ice cleared regularly. Use sand or salt on walking paths during winter. Clean up any spills in your garage right away. This includes oil or grease spills. What can I do in the bathroom? Use night lights. Install grab bars by the toilet and in the tub and shower. Do not use towel bars as grab  bars. Use non-skid mats or decals in the tub or shower. If you need to sit down in the shower, use a plastic, non-slip stool. Keep the floor dry. Clean up any water that spills on the floor as soon as it happens. Remove soap buildup in the tub or shower regularly. Attach bath mats securely with double-sided non-slip rug tape. Do not have throw rugs and other things on the floor that can make you trip. What can I do in the bedroom? Use night lights. Make sure that you have a light by your bed that is easy to reach. Do not use any sheets or blankets that are too big for your bed. They should not hang down onto the floor. Have a firm chair that has side arms. You can use this for support while you get dressed. Do not have throw rugs and other things on the floor that can make you trip. What can I do in the kitchen? Clean up any spills right away. Avoid walking on wet floors. Keep items that you use a lot in easy-to-reach places. If you need to reach something above you, use a strong step stool that has a grab bar. Keep electrical cords out of the way. Do not use floor  polish or wax that makes floors slippery. If you must use wax, use non-skid floor wax. Do not have throw rugs and other things on the floor that can make you trip. What can I do with my stairs? Do not leave any items on the stairs. Make sure that there are handrails on both sides of the stairs and use them. Fix handrails that are broken or loose. Make sure that handrails are as long as the stairways. Check any carpeting to make sure that it is firmly attached to the stairs. Fix any carpet that is loose or worn. Avoid having throw rugs at the top or bottom of the stairs. If you do have throw rugs, attach them to the floor with carpet tape. Make sure that you have a light switch at the top of the stairs and the bottom of the stairs. If you do not have them, ask someone to add them for you. What else can I do to help prevent  falls? Wear shoes that: Do not have high heels. Have rubber bottoms. Are comfortable and fit you well. Are closed at the toe. Do not wear sandals. If you use a stepladder: Make sure that it is fully opened. Do not climb a closed stepladder. Make sure that both sides of the stepladder are locked into place. Ask someone to hold it for you, if possible. Clearly mark and make sure that you can see: Any grab bars or handrails. First and last steps. Where the edge of each step is. Use tools that help you move around (mobility aids) if they are needed. These include: Canes. Walkers. Scooters. Crutches. Turn on the lights when you go into a dark area. Replace any light bulbs as soon as they burn out. Set up your furniture so you have a clear path. Avoid moving your furniture around. If any of your floors are uneven, fix them. If there are any pets around you, be aware of where they are. Review your medicines with your doctor. Some medicines can make you feel dizzy. This can increase your chance of falling. Ask your doctor what other things that you can do to help prevent falls. This information is not intended to replace advice given to you by your health care provider. Make sure you discuss any questions you have with your health care provider. Document Released: 01/08/2009 Document Revised: 08/20/2015 Document Reviewed: 04/18/2014 Elsevier Interactive Patient Education  2017 [employer].

## 2022-11-16 DIAGNOSIS — G060 Intracranial abscess and granuloma: Secondary | ICD-10-CM | POA: Diagnosis not present

## 2022-11-16 DIAGNOSIS — Z792 Long term (current) use of antibiotics: Secondary | ICD-10-CM | POA: Diagnosis not present

## 2022-11-16 DIAGNOSIS — G0490 Encephalitis and encephalomyelitis, unspecified: Secondary | ICD-10-CM | POA: Diagnosis not present

## 2022-11-16 DIAGNOSIS — L98494 Non-pressure chronic ulcer of skin of other sites with necrosis of bone: Secondary | ICD-10-CM | POA: Diagnosis not present

## 2022-11-16 DIAGNOSIS — G936 Cerebral edema: Secondary | ICD-10-CM | POA: Diagnosis not present

## 2022-11-16 DIAGNOSIS — G062 Extradural and subdural abscess, unspecified: Secondary | ICD-10-CM | POA: Diagnosis not present

## 2022-11-17 ENCOUNTER — Telehealth: Payer: Self-pay | Admitting: Internal Medicine

## 2022-11-17 ENCOUNTER — Ambulatory Visit: Payer: Self-pay

## 2022-11-17 DIAGNOSIS — G060 Intracranial abscess and granuloma: Secondary | ICD-10-CM | POA: Diagnosis not present

## 2022-11-17 DIAGNOSIS — M8668 Other chronic osteomyelitis, other site: Secondary | ICD-10-CM | POA: Diagnosis not present

## 2022-11-17 DIAGNOSIS — G40909 Epilepsy, unspecified, not intractable, without status epilepticus: Secondary | ICD-10-CM | POA: Diagnosis not present

## 2022-11-17 DIAGNOSIS — Z792 Long term (current) use of antibiotics: Secondary | ICD-10-CM | POA: Diagnosis not present

## 2022-11-17 DIAGNOSIS — G0490 Encephalitis and encephalomyelitis, unspecified: Secondary | ICD-10-CM | POA: Diagnosis not present

## 2022-11-17 DIAGNOSIS — Z6823 Body mass index (BMI) 23.0-23.9, adult: Secondary | ICD-10-CM | POA: Diagnosis not present

## 2022-11-17 DIAGNOSIS — G062 Extradural and subdural abscess, unspecified: Secondary | ICD-10-CM | POA: Diagnosis not present

## 2022-11-17 DIAGNOSIS — L98494 Non-pressure chronic ulcer of skin of other sites with necrosis of bone: Secondary | ICD-10-CM | POA: Diagnosis not present

## 2022-11-17 NOTE — Patient Instructions (Signed)
Visit Information  Thank you for taking time to visit with me today. Please don't hesitate to contact me if I can be of assistance to you.   Following are the goals we discussed today:  Continue to take medications as prescribed. Continue to attend provider visits as scheduled Contact provider with any Health questions or concerns  Our next appointment is by telephone on 12/02/22 at 11:30 am  Please call the care guide team at [phone] if you need to cancel or reschedule your appointment.   If you are experiencing a Mental Health or Behavioral Health Crisis or need someone to talk to, please call the Suicide and Crisis Lifeline: 988 call the [place] National Suicide Prevention Lifeline: 610-228-9888 or TTY: (913) 259-6750 TTY (670)560-9682) to talk to a trained counselor call 1-800-273-TALK (toll free, 24 hour hotline)  Kathyrn Sheriff, RN, MSN, BSN, CCM Care Management Coordinator [phone]

## 2022-11-17 NOTE — Telephone Encounter (Signed)
Okay for orders? 

## 2022-11-17 NOTE — Telephone Encounter (Signed)
HH ORDERS   Caller Name: Cameron(OT) Home Health Agency Name: [facility] Phone #: [phone](secure)  Service Requested: home health aid for bathing and dressing (examples: OT/PT/Skilled Nursing/Social Work/Speech Therapy/Wound Care)  Frequency of Visits: 1X a week for 4 weeks

## 2022-11-17 NOTE — Patient Outreach (Signed)
  Care Coordination   Initial Visit Note   11/17/2022 Name: Hunter Chang MRN: [id number] DOB: [birth year]  Hunter Chang is a 77 y.o. year old male who sees Burns, Bobette Mo, MD for primary care. I spoke with  Hunter Chang and spouse Antaeus Rideout by phone today.  What matters to the patients health and wellness today?  Mr. Fessler reports he is doing well. Has followed up with PCP 11/04/22. Medications reviewed with Ms. Maudlin. No questions or concerns at this time.  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]  General Interventions   General Interventions Discussed/Reviewed General Interventions Discussed  Doctor Visits Discussed/Reviewed Doctor Visits Discussed, PCP  PCP/Specialist Visits Compliance with follow-up visit  [discussed upcoming appointments, encouraged to attend as scheduled]  Education Interventions   Education Provided Provided Education  Provided Verbal Education On Medication, When to see the doctor  [advised to continue to take medications as prescribed, encouraged to attend provider visits as scheduled, encouraged to contact provider with health questions or concerns.]  Nutrition Interventions   Nutrition Discussed/Reviewed Nutrition Discussed  Pharmacy Interventions   Pharmacy Dicussed/Reviewed Pharmacy Topics Discussed            SDOH assessments and interventions completed:  No. Recently completed.  Care Coordination Interventions:  Yes, provided   Follow up plan: Follow up call scheduled for 12/02/22    Encounter Outcome:  Pt. Visit Completed   Kathyrn Sheriff, RN, MSN, BSN, CCM Care Management Coordinator [phone]

## 2022-11-18 NOTE — Telephone Encounter (Signed)
Message left today with verbals. 

## 2022-11-23 ENCOUNTER — Encounter: Payer: Self-pay | Admitting: Internal Medicine

## 2022-11-23 DIAGNOSIS — Z7409 Other reduced mobility: Secondary | ICD-10-CM

## 2022-11-23 DIAGNOSIS — R531 Weakness: Secondary | ICD-10-CM

## 2022-12-02 ENCOUNTER — Ambulatory Visit: Payer: Self-pay

## 2022-12-02 NOTE — Patient Instructions (Signed)
Visit Information  Thank you for taking time to visit with me today. Please don't hesitate to contact me if I can be of assistance to you.   Following are the goals we discussed today:  Continue to take medications as prescribed. Continue to attend provider visits as scheduled Contact provider for health questions or concerns as needed   Our next appointment is by telephone on 01/06/23 at 11;30 am  Please call the care guide team at [phone] if you need to cancel or reschedule your appointment.   If you are experiencing a Mental Health or Behavioral Health Crisis or need someone to talk to, please call the Suicide and Crisis Lifeline: 988 call the [place] National Suicide Prevention Lifeline: 828-080-9775 or TTY: 410-147-8946 TTY (979) 008-3748) to talk to a trained counselor call 1-800-273-TALK (toll free, 24 hour hotline)  Kathyrn Sheriff, RN, MSN, BSN, CCM Care Management Coordinator [phone]

## 2022-12-02 NOTE — Patient Outreach (Signed)
  Care Coordination   Follow Up Visit Note   12/02/2022 Name: Hunter Chang MRN: [id number] DOB: 1945-03-30  Hunter Chang is a 77 y.o. year old male who sees Burns, Bobette Mo, MD for primary care. I spoke with  Hunter Chang and spouse Hunter Chang by phone today.  What matters to the patients health and wellness today?  Hunter Chang states Hunter Chang has been transitioned from IV antibiotics to po flagyl and Cipro. She states he was weaned from Prednisone, but reports patient had a seizure yesterday and Prednisone 10 mg daily was restarted yesterday by provider. Per Ms. Bozza, patient's stomach has been upset by PO antibiotics and provider has prescribed Zofran. Patient being followed by Infectious disease provider at [facility]. Also being followed by Radiation Oncology at [facility].  Goals Addressed             This Visit's Progress    health management-post hospitalization       Interventions Today    Flowsheet Row Most Recent Value  Chronic Disease   Chronic disease during today's visit Other  [brain abcess]  General Interventions   General Interventions Discussed/Reviewed General Interventions Reviewed  Doctor Visits Discussed/Reviewed Doctor Visits Reviewed, PCP, Specialist  PCP/Specialist Visits Compliance with follow-up visit  [reveiwed upcoming/scheduled appointments]  Exercise Interventions   Exercise Discussed/Reviewed Exercise Reviewed  [confirmed remains active with home health PT/OT/Bath aid]  Education Interventions   Education Provided Provided Education  Provided Verbal Education On Medication, Other  [advised to continue to take medications as prescribed, attend provider visit as scheduled, contact provider with any health questions or concerns.]  Pharmacy Interventions   Pharmacy Dicussed/Reviewed Pharmacy Topics Reviewed  [medications reviewed. encouraged paitent/spouse to see if patient could benefti from a probiotic.]   Safety Interventions   Safety Discussed/Reviewed Fall Risk, Safety Reviewed            SDOH assessments and interventions completed:  No  Care Coordination Interventions:  Yes, provided   Follow up plan: Follow up call scheduled for 01/06/23    Encounter Outcome:  Patient Visit Completed   Kathyrn Sheriff, RN, MSN, BSN, CCM Care Management Coordinator [phone]

## 2022-12-05 ENCOUNTER — Telehealth: Payer: Self-pay | Admitting: Internal Medicine

## 2022-12-05 ENCOUNTER — Encounter: Payer: Self-pay | Admitting: Internal Medicine

## 2022-12-05 NOTE — Telephone Encounter (Signed)
Tiffany from [facility] called to see if faxes were received. They were originally received and sent back to them without a signature. They re-faxed them to get the signature from Dr. Lawerance Bach on 11/25/22 and have not heard anything back. It was a discharge form and verbal orders for home health bathing/dressing assistance. Best callback for Elmarie Shiley is [phone], extension [phone].

## 2022-12-05 NOTE — Telephone Encounter (Signed)
error 

## 2022-12-06 NOTE — Telephone Encounter (Signed)
Re-faxed today.

## 2022-12-07 ENCOUNTER — Telehealth: Payer: Self-pay | Admitting: Internal Medicine

## 2022-12-07 NOTE — Telephone Encounter (Signed)
Okay for orders? 

## 2022-12-07 NOTE — Telephone Encounter (Signed)
Melissa with [facility] called requesting verbals for occupational therapy 1 time a week for 1 week for re certification, a social work consult, and a speech therapy evaluation. Best callback is [phone]

## 2022-12-08 ENCOUNTER — Telehealth: Payer: Self-pay | Admitting: Internal Medicine

## 2022-12-08 NOTE — Telephone Encounter (Signed)
Verbals left today on voicemail.

## 2022-12-08 NOTE — Telephone Encounter (Signed)
OT Therapist told MR. Hunter Chang that he would benefit from taking remeron.  They would like a script sent in to [facility] on [address].  Please call patient and advise if this is going to be sent in or not.  [phone]

## 2022-12-08 NOTE — Telephone Encounter (Signed)
Spoke with Hunter Chang today and info given.

## 2022-12-08 NOTE — Telephone Encounter (Signed)
Please ask Hunter Chang to discuss with his neurologist - the remeron has a potential to increase his risk of seizures so we need to see if his neurologist is ok with a low dose or not

## 2022-12-13 DIAGNOSIS — F32A Depression, unspecified: Secondary | ICD-10-CM | POA: Diagnosis not present

## 2022-12-13 DIAGNOSIS — E559 Vitamin D deficiency, unspecified: Secondary | ICD-10-CM | POA: Diagnosis not present

## 2022-12-13 DIAGNOSIS — R569 Unspecified convulsions: Secondary | ICD-10-CM | POA: Diagnosis not present

## 2022-12-13 DIAGNOSIS — Z7901 Long term (current) use of anticoagulants: Secondary | ICD-10-CM | POA: Diagnosis not present

## 2022-12-13 DIAGNOSIS — D496 Neoplasm of unspecified behavior of brain: Secondary | ICD-10-CM | POA: Diagnosis not present

## 2022-12-13 DIAGNOSIS — H409 Unspecified glaucoma: Secondary | ICD-10-CM | POA: Diagnosis not present

## 2022-12-13 DIAGNOSIS — K219 Gastro-esophageal reflux disease without esophagitis: Secondary | ICD-10-CM | POA: Diagnosis not present

## 2022-12-13 DIAGNOSIS — C4490 Unspecified malignant neoplasm of skin, unspecified: Secondary | ICD-10-CM | POA: Diagnosis not present

## 2022-12-13 DIAGNOSIS — E119 Type 2 diabetes mellitus without complications: Secondary | ICD-10-CM | POA: Diagnosis not present

## 2022-12-13 DIAGNOSIS — D239 Other benign neoplasm of skin, unspecified: Secondary | ICD-10-CM | POA: Diagnosis not present

## 2022-12-13 DIAGNOSIS — R531 Weakness: Secondary | ICD-10-CM | POA: Diagnosis not present

## 2022-12-13 DIAGNOSIS — G060 Intracranial abscess and granuloma: Secondary | ICD-10-CM | POA: Diagnosis not present

## 2022-12-13 DIAGNOSIS — G08 Intracranial and intraspinal phlebitis and thrombophlebitis: Secondary | ICD-10-CM | POA: Diagnosis not present

## 2022-12-14 DIAGNOSIS — R569 Unspecified convulsions: Secondary | ICD-10-CM | POA: Diagnosis not present

## 2022-12-14 DIAGNOSIS — R110 Nausea: Secondary | ICD-10-CM | POA: Diagnosis not present

## 2022-12-14 DIAGNOSIS — G0490 Encephalitis and encephalomyelitis, unspecified: Secondary | ICD-10-CM | POA: Diagnosis not present

## 2022-12-15 ENCOUNTER — Telehealth: Payer: Self-pay | Admitting: Internal Medicine

## 2022-12-15 DIAGNOSIS — R569 Unspecified convulsions: Secondary | ICD-10-CM | POA: Diagnosis not present

## 2022-12-15 DIAGNOSIS — G062 Extradural and subdural abscess, unspecified: Secondary | ICD-10-CM | POA: Diagnosis not present

## 2022-12-15 DIAGNOSIS — L98494 Non-pressure chronic ulcer of skin of other sites with necrosis of bone: Secondary | ICD-10-CM | POA: Diagnosis not present

## 2022-12-15 DIAGNOSIS — Z792 Long term (current) use of antibiotics: Secondary | ICD-10-CM | POA: Diagnosis not present

## 2022-12-15 DIAGNOSIS — G060 Intracranial abscess and granuloma: Secondary | ICD-10-CM | POA: Diagnosis not present

## 2022-12-15 DIAGNOSIS — G0490 Encephalitis and encephalomyelitis, unspecified: Secondary | ICD-10-CM | POA: Diagnosis not present

## 2022-12-15 NOTE — Telephone Encounter (Signed)
Pt requested home health ST moved from the week of 9/15 to 9/22 orders will be faxed [facility] request[phone]

## 2022-12-15 NOTE — Telephone Encounter (Signed)
Noted  

## 2022-12-19 DIAGNOSIS — G062 Extradural and subdural abscess, unspecified: Secondary | ICD-10-CM | POA: Diagnosis not present

## 2022-12-19 DIAGNOSIS — Z87898 Personal history of other specified conditions: Secondary | ICD-10-CM | POA: Diagnosis not present

## 2022-12-19 DIAGNOSIS — Z09 Encounter for follow-up examination after completed treatment for conditions other than malignant neoplasm: Secondary | ICD-10-CM | POA: Diagnosis not present

## 2022-12-19 DIAGNOSIS — R110 Nausea: Secondary | ICD-10-CM | POA: Diagnosis not present

## 2022-12-19 DIAGNOSIS — L98494 Non-pressure chronic ulcer of skin of other sites with necrosis of bone: Secondary | ICD-10-CM | POA: Diagnosis not present

## 2022-12-19 DIAGNOSIS — G060 Intracranial abscess and granuloma: Secondary | ICD-10-CM | POA: Diagnosis not present

## 2022-12-19 DIAGNOSIS — R569 Unspecified convulsions: Secondary | ICD-10-CM | POA: Diagnosis not present

## 2022-12-19 DIAGNOSIS — G0490 Encephalitis and encephalomyelitis, unspecified: Secondary | ICD-10-CM | POA: Diagnosis not present

## 2022-12-19 DIAGNOSIS — Z792 Long term (current) use of antibiotics: Secondary | ICD-10-CM | POA: Diagnosis not present

## 2022-12-20 ENCOUNTER — Telehealth: Payer: Self-pay | Admitting: Internal Medicine

## 2022-12-20 NOTE — Telephone Encounter (Signed)
[facility] is needing orders for this pt and it was faxed over to [place] on 9/19 she mentioned and its for evaluation scheduling.

## 2022-12-20 NOTE — Telephone Encounter (Signed)
Orders faxed today

## 2022-12-21 DIAGNOSIS — G8194 Hemiplegia, unspecified affecting left nondominant side: Secondary | ICD-10-CM | POA: Diagnosis not present

## 2022-12-21 DIAGNOSIS — D6869 Other thrombophilia: Secondary | ICD-10-CM | POA: Diagnosis not present

## 2022-12-22 ENCOUNTER — Encounter: Payer: Self-pay | Admitting: Internal Medicine

## 2022-12-25 MED ORDER — SERTRALINE HCL 50 MG PO TABS: 50.0000 mg | ORAL_TABLET | Freq: Every day | ORAL | 1 refills | Status: DC

## 2022-12-25 NOTE — Addendum Note (Signed)
Addended by: Pincus Sanes on: 12/25/2022 02:36 PM   Modules accepted: Orders

## 2022-12-28 DIAGNOSIS — K219 Gastro-esophageal reflux disease without esophagitis: Secondary | ICD-10-CM | POA: Diagnosis not present

## 2022-12-28 DIAGNOSIS — I96 Gangrene, not elsewhere classified: Secondary | ICD-10-CM | POA: Diagnosis not present

## 2022-12-28 DIAGNOSIS — E11622 Type 2 diabetes mellitus with other skin ulcer: Secondary | ICD-10-CM | POA: Diagnosis not present

## 2022-12-28 DIAGNOSIS — E1152 Type 2 diabetes mellitus with diabetic peripheral angiopathy with gangrene: Secondary | ICD-10-CM | POA: Diagnosis not present

## 2022-12-28 DIAGNOSIS — E876 Hypokalemia: Secondary | ICD-10-CM | POA: Diagnosis not present

## 2022-12-28 DIAGNOSIS — L98499 Non-pressure chronic ulcer of skin of other sites with unspecified severity: Secondary | ICD-10-CM | POA: Diagnosis not present

## 2022-12-28 DIAGNOSIS — H401131 Primary open-angle glaucoma, bilateral, mild stage: Secondary | ICD-10-CM | POA: Diagnosis not present

## 2022-12-28 DIAGNOSIS — L98494 Non-pressure chronic ulcer of skin of other sites with necrosis of bone: Secondary | ICD-10-CM | POA: Diagnosis not present

## 2022-12-28 DIAGNOSIS — F324 Major depressive disorder, single episode, in partial remission: Secondary | ICD-10-CM | POA: Diagnosis not present

## 2022-12-28 DIAGNOSIS — R569 Unspecified convulsions: Secondary | ICD-10-CM | POA: Diagnosis not present

## 2022-12-28 DIAGNOSIS — R269 Unspecified abnormalities of gait and mobility: Secondary | ICD-10-CM | POA: Diagnosis not present

## 2022-12-28 DIAGNOSIS — Z7901 Long term (current) use of anticoagulants: Secondary | ICD-10-CM | POA: Diagnosis not present

## 2022-12-29 DIAGNOSIS — G060 Intracranial abscess and granuloma: Secondary | ICD-10-CM | POA: Diagnosis not present

## 2022-12-29 DIAGNOSIS — D496 Neoplasm of unspecified behavior of brain: Secondary | ICD-10-CM | POA: Diagnosis not present

## 2022-12-29 DIAGNOSIS — D4819 Other specified neoplasm of uncertain behavior of connective and other soft tissue: Secondary | ICD-10-CM | POA: Diagnosis not present

## 2022-12-29 DIAGNOSIS — Z923 Personal history of irradiation: Secondary | ICD-10-CM | POA: Diagnosis not present

## 2023-01-02 DIAGNOSIS — L98494 Non-pressure chronic ulcer of skin of other sites with necrosis of bone: Secondary | ICD-10-CM | POA: Diagnosis not present

## 2023-01-02 DIAGNOSIS — G060 Intracranial abscess and granuloma: Secondary | ICD-10-CM | POA: Diagnosis not present

## 2023-01-02 DIAGNOSIS — G062 Extradural and subdural abscess, unspecified: Secondary | ICD-10-CM | POA: Diagnosis not present

## 2023-01-02 DIAGNOSIS — Z792 Long term (current) use of antibiotics: Secondary | ICD-10-CM | POA: Diagnosis not present

## 2023-01-02 DIAGNOSIS — M8668 Other chronic osteomyelitis, other site: Secondary | ICD-10-CM | POA: Diagnosis not present

## 2023-01-02 DIAGNOSIS — G0490 Encephalitis and encephalomyelitis, unspecified: Secondary | ICD-10-CM | POA: Diagnosis not present

## 2023-01-06 DIAGNOSIS — L98494 Non-pressure chronic ulcer of skin of other sites with necrosis of bone: Secondary | ICD-10-CM | POA: Diagnosis not present

## 2023-01-09 ENCOUNTER — Ambulatory Visit: Payer: Self-pay

## 2023-01-09 NOTE — Patient Outreach (Unsigned)
Care Coordination   Follow Up Visit Note   01/09/2023 Name: Hunter Chang MRN: [id number] DOB: [birth year]  Hunter Chang is a 77 y.o. year old male who sees Burns, Bobette Mo, MD for primary care. I spoke with  spouse Hunter Chang) by phone today.  What matters to the patients health and wellness today?  Reports is attending provider visits as scheduled. Repors paitent improved and states nausea has improved. Ambulating with walker with supervision. Reports appetite good. Open wound unchanged, monitored by provider cleaned and ointment applied, uncovered per provider recommendations. Denies any questions or concerns at this time.  Goals Addressed             This Visit's Progress    health management-post hospitalization       Interventions Today    Flowsheet Row Most Recent Value  Chronic Disease   Chronic disease during today's visit Other  General Interventions   General Interventions Discussed/Reviewed General Interventions Reviewed, Doctor Visits  Doctor Visits Discussed/Reviewed PCP, Specialist  PCP/Specialist Visits Compliance with follow-up visit  [reviewed upcoming/scheduled appointment]  Education Interventions   Education Provided Provided Education  Provided Verbal Education On When to see the doctor, Medication  Nutrition Interventions   Nutrition Discussed/Reviewed Nutrition Reviewed  [assess if any additional nutritional needs]  Pharmacy Interventions   Pharmacy Dicussed/Reviewed Pharmacy Topics Reviewed  Safety Interventions   Safety Discussed/Reviewed Fall Risk, Safety Reviewed              SDOH assessments and interventions completed:  No{THN Tip this will not be part of the note when signed-REQUIRED REPORT FIELD DO NOT DELETE (Optional):27901}     Care Coordination Interventions:  Yes, provided {THN Tip this will not be part of the note when signed-REQUIRED REPORT FIELD DO NOT DELETE (Optional):27901}  Follow up plan: Follow up call scheduled for  02/09/23    Encounter Outcome:  Patient Visit Completed {THN Tip this will not be part of the note when signed-REQUIRED REPORT FIELD DO NOT DELETE (Optional):27901}  Hunter Sheriff, RN, MSN, BSN, CCM Care Management Coordinator [phone]

## 2023-01-10 NOTE — Patient Instructions (Addendum)
Visit Information  Thank you for taking time to visit with me today. Please don't hesitate to contact me if I can be of assistance to you.   Following are the goals we discussed today:  Continue to take medications as prescribed. Continue to attend provider visits as scheduled Continue to eat healthy, lean meats, vegetables, fruits, avoid saturated and transfats. Include healthy protein to promote wound healing Contact provider with health questions or concerns as needed   Our next appointment is by telephone on 02/09/23 at 1:00 pm  Please call the care guide team at [phone] if you need to cancel or reschedule your appointment.   If you are experiencing a Mental Health or Behavioral Health Crisis or need someone to talk to, please call the Suicide and Crisis Lifeline: 988 call the [place] National Suicide Prevention Lifeline: 620-876-6419 or TTY: (980)206-8752 TTY (304) 607-3040) to talk to a trained counselor call 1-800-273-TALK (toll free, 24 hour hotline)  Kathyrn Sheriff, RN, MSN, BSN, CCM Care Management Coordinator [phone]

## 2023-01-11 DIAGNOSIS — M8668 Other chronic osteomyelitis, other site: Secondary | ICD-10-CM | POA: Diagnosis not present

## 2023-01-11 DIAGNOSIS — G060 Intracranial abscess and granuloma: Secondary | ICD-10-CM | POA: Diagnosis not present

## 2023-01-11 DIAGNOSIS — G0490 Encephalitis and encephalomyelitis, unspecified: Secondary | ICD-10-CM | POA: Diagnosis not present

## 2023-01-11 DIAGNOSIS — G062 Extradural and subdural abscess, unspecified: Secondary | ICD-10-CM | POA: Diagnosis not present

## 2023-01-11 DIAGNOSIS — L98494 Non-pressure chronic ulcer of skin of other sites with necrosis of bone: Secondary | ICD-10-CM | POA: Diagnosis not present

## 2023-01-17 ENCOUNTER — Other Ambulatory Visit: Payer: Self-pay | Admitting: Internal Medicine

## 2023-02-01 ENCOUNTER — Other Ambulatory Visit: Payer: Self-pay | Admitting: Internal Medicine

## 2023-02-07 DIAGNOSIS — D496 Neoplasm of unspecified behavior of brain: Secondary | ICD-10-CM | POA: Diagnosis not present

## 2023-02-09 ENCOUNTER — Ambulatory Visit: Payer: Self-pay

## 2023-02-09 NOTE — Patient Outreach (Signed)
  Care Coordination   Follow Up Visit Note   02/09/2023 Name: Hunter Chang MRN: [id number] DOB: June 27, 1945  Hunter Chang is a 77 y.o. year old male who sees Burns, Bobette Mo, MD for primary care. I spoke with spouse Arboriculturist) by phone today.  What matters to the patients health and wellness today?  Hunter Chang reports patient has been "doing really well". She reports medications prescribed for depression has helped patient to focus and be more alert and engaged. She reports [name] has improved so he is no longer having to use transport chair, but is ambulating with walker now. She denies any questions or concerns at this time.  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]  General Interventions   General Interventions Discussed/Reviewed General Interventions Reviewed, Doctor Visits  [Evaluation of current treatment plan for health condition and patient's adherence to plan.]  Doctor Visits Discussed/Reviewed Doctor Visits Reviewed, PCP  PCP/Specialist Visits Compliance with follow-up visit  [discussed upcoming follow up appointments.]  Education Interventions   Education Provided Provided Education  Provided Verbal Education On Medication, When to see the doctor  Algis Downs continue to take medications as prescribed, attend provider visits as scheduled, contact provider if health quesitons or concerns.]  Pharmacy Interventions   Pharmacy Dicussed/Reviewed Pharmacy Topics Reviewed            SDOH assessments and interventions completed:  No  Care Coordination Interventions:  Yes, provided   Follow up plan: Follow up call scheduled for 03/14/23     Encounter Outcome:  Patient Visit Completed   Kathyrn Sheriff, RN, MSN, BSN, CCM Care Management Coordinator [phone]

## 2023-02-09 NOTE — Patient Instructions (Signed)
Visit Information  Thank you for taking time to visit with me today. Please don't hesitate to contact me if I can be of assistance to you.   Following are the goals we discussed today:  Continue to take medications as prescribed. Continue to attend provider visits as scheduled Continue to eat healthy, lean meats, vegetables, fruits, avoid saturated and transfats Contact provider with health questions or concerns as needed   Our next appointment is by telephone on 03/14/23 at 1:30 pm  Please call the care guide team at [phone] if you need to cancel or reschedule your appointment.   If you are experiencing a Mental Health or Behavioral Health Crisis or need someone to talk to, please call the Suicide and Crisis Lifeline: 988 call the [place] National Suicide Prevention Lifeline: (581) 804-9753 or TTY: (438)633-1828 TTY 3306087129) to talk to a trained counselor call 1-800-273-TALK (toll free, 24 hour hotline)   Kathyrn Sheriff, RN, MSN, BSN, CCM Care Management Coordinator [phone]

## 2023-02-21 DIAGNOSIS — L821 Other seborrheic keratosis: Secondary | ICD-10-CM | POA: Diagnosis not present

## 2023-02-21 DIAGNOSIS — L570 Actinic keratosis: Secondary | ICD-10-CM | POA: Diagnosis not present

## 2023-02-21 DIAGNOSIS — Z85828 Personal history of other malignant neoplasm of skin: Secondary | ICD-10-CM | POA: Diagnosis not present

## 2023-02-21 DIAGNOSIS — L853 Xerosis cutis: Secondary | ICD-10-CM | POA: Diagnosis not present

## 2023-02-21 DIAGNOSIS — L603 Nail dystrophy: Secondary | ICD-10-CM | POA: Diagnosis not present

## 2023-02-21 DIAGNOSIS — L812 Freckles: Secondary | ICD-10-CM | POA: Diagnosis not present

## 2023-02-21 DIAGNOSIS — D1801 Hemangioma of skin and subcutaneous tissue: Secondary | ICD-10-CM | POA: Diagnosis not present

## 2023-03-01 DIAGNOSIS — Z7901 Long term (current) use of anticoagulants: Secondary | ICD-10-CM | POA: Diagnosis not present

## 2023-03-01 DIAGNOSIS — L98494 Non-pressure chronic ulcer of skin of other sites with necrosis of bone: Secondary | ICD-10-CM | POA: Diagnosis not present

## 2023-03-01 DIAGNOSIS — C4490 Unspecified malignant neoplasm of skin, unspecified: Secondary | ICD-10-CM | POA: Diagnosis not present

## 2023-03-01 DIAGNOSIS — E11622 Type 2 diabetes mellitus with other skin ulcer: Secondary | ICD-10-CM | POA: Diagnosis not present

## 2023-03-01 DIAGNOSIS — D239 Other benign neoplasm of skin, unspecified: Secondary | ICD-10-CM | POA: Diagnosis not present

## 2023-03-01 DIAGNOSIS — F32A Depression, unspecified: Secondary | ICD-10-CM | POA: Diagnosis not present

## 2023-03-01 DIAGNOSIS — H409 Unspecified glaucoma: Secondary | ICD-10-CM | POA: Diagnosis not present

## 2023-03-01 DIAGNOSIS — G060 Intracranial abscess and granuloma: Secondary | ICD-10-CM | POA: Diagnosis not present

## 2023-03-01 DIAGNOSIS — R569 Unspecified convulsions: Secondary | ICD-10-CM | POA: Diagnosis not present

## 2023-03-01 DIAGNOSIS — G08 Intracranial and intraspinal phlebitis and thrombophlebitis: Secondary | ICD-10-CM | POA: Diagnosis not present

## 2023-03-01 DIAGNOSIS — R531 Weakness: Secondary | ICD-10-CM | POA: Diagnosis not present

## 2023-03-01 DIAGNOSIS — E119 Type 2 diabetes mellitus without complications: Secondary | ICD-10-CM | POA: Diagnosis not present

## 2023-03-01 DIAGNOSIS — E559 Vitamin D deficiency, unspecified: Secondary | ICD-10-CM | POA: Diagnosis not present

## 2023-03-01 DIAGNOSIS — K219 Gastro-esophageal reflux disease without esophagitis: Secondary | ICD-10-CM | POA: Diagnosis not present

## 2023-03-06 DIAGNOSIS — R569 Unspecified convulsions: Secondary | ICD-10-CM | POA: Diagnosis not present

## 2023-03-14 ENCOUNTER — Telehealth: Payer: Self-pay

## 2023-03-14 NOTE — Patient Outreach (Signed)
  Care Coordination   03/14/2023 Name: Hunter Chang MRN: [id number] DOB: 01-30-1946   Care Coordination Outreach Attempts:  An unsuccessful outreach was attempted for an appointment today.  Follow Up Plan:  Additional outreach attempts will be made to offer the patient complex care management information and services.   Encounter Outcome:  No Answer   Care Coordination Interventions:  No, not indicated    Kathyrn Sheriff, RN, MSN, BSN, CCM Care Management Coordinator [phone]

## 2023-03-20 ENCOUNTER — Encounter: Payer: Self-pay | Admitting: Internal Medicine

## 2023-03-23 ENCOUNTER — Other Ambulatory Visit: Payer: Self-pay

## 2023-03-23 NOTE — Telephone Encounter (Signed)
Copied from CRM [id number]. Topic: Clinical - Medication Refill >> Mar 23, 2023 12:15 PM Ernst Spell wrote: Most Recent Primary Care Visit:  Provider: Mickeal Needy  Department: [facility]  Visit Type: MEDICARE AWV, SEQUENTIAL  Date: 11/15/2022  Medication: ELIQUIS 5 MG TABS tablet sertraline (ZOLOFT) 50 MG tablet  Has the patient contacted their pharmacy? Yes, Patient was advised it was an early fill and they would have to pay full price.   Is this the correct pharmacy for this prescription? yes  This is the patient's preferred pharmacy:  [facility], [address], [place], [place] [address] [phone].     Has the prescription been filled recently? No  Is the patient out of the medication? Yes  Has the patient been seen for an appointment in the last year OR does the patient have an upcoming appointment? Yes  Can we respond through MyChart? Yes  Agent: Please be advised that Rx refills may take up to 3 business days. We ask that you follow-up with your pharmacy.

## 2023-03-24 ENCOUNTER — Other Ambulatory Visit: Payer: Self-pay | Admitting: Radiology

## 2023-03-24 MED ORDER — PREDNISONE 20 MG PO TABS: 20.0000 mg | ORAL_TABLET | Freq: Every day | ORAL | 0 refills | Status: DC

## 2023-03-24 MED ORDER — APIXABAN 5 MG PO TABS: 5.0000 mg | ORAL_TABLET | Freq: Two times a day (BID) | ORAL | 0 refills | Status: DC

## 2023-03-24 MED ORDER — SERTRALINE HCL 50 MG PO TABS: 50.0000 mg | ORAL_TABLET | Freq: Every day | ORAL | 0 refills | Status: DC

## 2023-03-24 NOTE — Addendum Note (Signed)
Addended by: Corwin Levins on: 03/24/2023 12:22 PM   Modules accepted: Orders

## 2023-04-03 DIAGNOSIS — Z923 Personal history of irradiation: Secondary | ICD-10-CM | POA: Diagnosis not present

## 2023-04-03 DIAGNOSIS — D496 Neoplasm of unspecified behavior of brain: Secondary | ICD-10-CM | POA: Diagnosis not present

## 2023-04-03 DIAGNOSIS — D485 Neoplasm of uncertain behavior of skin: Secondary | ICD-10-CM | POA: Diagnosis not present

## 2023-04-06 DIAGNOSIS — L98494 Non-pressure chronic ulcer of skin of other sites with necrosis of bone: Secondary | ICD-10-CM | POA: Diagnosis not present

## 2023-04-06 DIAGNOSIS — M8668 Other chronic osteomyelitis, other site: Secondary | ICD-10-CM | POA: Diagnosis not present

## 2023-04-06 DIAGNOSIS — G060 Intracranial abscess and granuloma: Secondary | ICD-10-CM | POA: Diagnosis not present

## 2023-04-06 DIAGNOSIS — G062 Extradural and subdural abscess, unspecified: Secondary | ICD-10-CM | POA: Diagnosis not present

## 2023-04-06 DIAGNOSIS — G0490 Encephalitis and encephalomyelitis, unspecified: Secondary | ICD-10-CM | POA: Diagnosis not present

## 2023-04-19 ENCOUNTER — Telehealth: Payer: Self-pay

## 2023-04-19 NOTE — Patient Outreach (Signed)
  Care Coordination   04/19/2023 Name: Hunter Chang MRN: [id number] DOB: 1945-05-08   Care Coordination Outreach Attempts:  A second unsuccessful outreach was attempted today to offer the patient with information about available complex care management services.  Follow Up Plan:  Additional outreach attempts will be made to offer the patient complex care management information and services.   Encounter Outcome:  No Answer   Care Coordination Interventions:  No, not indicated    Kathyrn Sheriff, RN, MSN, BSN, CCM [facility], Population Health Case Manager Phone: [phone]

## 2023-04-25 ENCOUNTER — Emergency Department (HOSPITAL_BASED_OUTPATIENT_CLINIC_OR_DEPARTMENT_OTHER)
Admission: EM | Admit: 2023-04-25 | Discharge: 2023-04-25 | Disposition: A | Discharge status: Home / Self Care | Payer: PPO | Attending: Emergency Medicine | Admitting: Emergency Medicine

## 2023-04-25 ENCOUNTER — Encounter (HOSPITAL_BASED_OUTPATIENT_CLINIC_OR_DEPARTMENT_OTHER): Payer: Self-pay | Admitting: Emergency Medicine

## 2023-04-25 ENCOUNTER — Other Ambulatory Visit: Payer: Self-pay

## 2023-04-25 ENCOUNTER — Other Ambulatory Visit (HOSPITAL_BASED_OUTPATIENT_CLINIC_OR_DEPARTMENT_OTHER): Payer: Self-pay

## 2023-04-25 ENCOUNTER — Emergency Department (HOSPITAL_BASED_OUTPATIENT_CLINIC_OR_DEPARTMENT_OTHER): Payer: PPO

## 2023-04-25 DIAGNOSIS — Z79899 Other long term (current) drug therapy: Secondary | ICD-10-CM | POA: Insufficient documentation

## 2023-04-25 DIAGNOSIS — K4090 Unilateral inguinal hernia, without obstruction or gangrene, not specified as recurrent: Secondary | ICD-10-CM | POA: Diagnosis not present

## 2023-04-25 DIAGNOSIS — M51369 Other intervertebral disc degeneration, lumbar region without mention of lumbar back pain or lower extremity pain: Secondary | ICD-10-CM | POA: Diagnosis not present

## 2023-04-25 DIAGNOSIS — N200 Calculus of kidney: Secondary | ICD-10-CM | POA: Diagnosis not present

## 2023-04-25 DIAGNOSIS — N134 Hydroureter: Secondary | ICD-10-CM | POA: Diagnosis not present

## 2023-04-25 DIAGNOSIS — Z7901 Long term (current) use of anticoagulants: Secondary | ICD-10-CM | POA: Insufficient documentation

## 2023-04-25 DIAGNOSIS — R109 Unspecified abdominal pain: Secondary | ICD-10-CM | POA: Diagnosis not present

## 2023-04-25 DIAGNOSIS — M5126 Other intervertebral disc displacement, lumbar region: Secondary | ICD-10-CM | POA: Diagnosis not present

## 2023-04-25 DIAGNOSIS — N202 Calculus of kidney with calculus of ureter: Secondary | ICD-10-CM | POA: Diagnosis not present

## 2023-04-25 DIAGNOSIS — M47817 Spondylosis without myelopathy or radiculopathy, lumbosacral region: Secondary | ICD-10-CM | POA: Diagnosis not present

## 2023-04-25 DIAGNOSIS — M47816 Spondylosis without myelopathy or radiculopathy, lumbar region: Secondary | ICD-10-CM | POA: Diagnosis not present

## 2023-04-25 DIAGNOSIS — R197 Diarrhea, unspecified: Secondary | ICD-10-CM | POA: Diagnosis not present

## 2023-04-25 DIAGNOSIS — N132 Hydronephrosis with renal and ureteral calculous obstruction: Secondary | ICD-10-CM | POA: Diagnosis not present

## 2023-04-25 HISTORY — DX: Malignant neoplasm of brain, unspecified: C71.9

## 2023-04-25 LAB — COMPREHENSIVE METABOLIC PANEL
ALT: 21 U/L (ref 0–44)
AST: 13 U/L — ABNORMAL LOW (ref 15–41)
Albumin: 3.8 g/dL (ref 3.5–5.0)
Alkaline Phosphatase: 68 U/L (ref 38–126)
Anion gap: 10 (ref 5–15)
BUN: 25 mg/dL — ABNORMAL HIGH (ref 8–23)
CO2: 25 mmol/L (ref 22–32)
Calcium: 9.4 mg/dL (ref 8.9–10.3)
Chloride: 107 mmol/L (ref 98–111)
Creatinine, Ser: 1.36 mg/dL — ABNORMAL HIGH (ref 0.61–1.24)
GFR, Estimated: 54 mL/min — ABNORMAL LOW (ref >60)
Glucose, Bld: 160 mg/dL — ABNORMAL HIGH (ref 70–99)
Potassium: 3 mmol/L — ABNORMAL LOW (ref 3.5–5.1)
Sodium: 142 mmol/L (ref 135–145)
Total Bilirubin: 0.5 mg/dL (ref 0.0–1.2)
Total Protein: 6.2 g/dL — ABNORMAL LOW (ref 6.5–8.1)

## 2023-04-25 LAB — CBC WITH DIFFERENTIAL/PLATELET
Abs Immature Granulocytes: 0.15 10*3/uL — ABNORMAL HIGH (ref 0.00–0.07)
Basophils Absolute: 0 10*3/uL (ref 0.0–0.1)
Basophils Relative: 0 %
Eosinophils Absolute: 0.1 10*3/uL (ref 0.0–0.5)
Eosinophils Relative: 1 %
HCT: 40.7 % (ref 39.0–52.0)
Hemoglobin: 14.2 g/dL (ref 13.0–17.0)
Immature Granulocytes: 1 %
Lymphocytes Relative: 5 %
Lymphs Abs: 0.6 10*3/uL — ABNORMAL LOW (ref 0.7–4.0)
MCH: 31.3 pg (ref 26.0–34.0)
MCHC: 34.9 g/dL (ref 30.0–36.0)
MCV: 89.6 fL (ref 80.0–100.0)
Monocytes Absolute: 1.2 10*3/uL — ABNORMAL HIGH (ref 0.1–1.0)
Monocytes Relative: 9 %
Neutro Abs: 11.2 10*3/uL — ABNORMAL HIGH (ref 1.7–7.7)
Neutrophils Relative %: 84 %
Platelets: 181 10*3/uL (ref 150–400)
RBC: 4.54 MIL/uL (ref 4.22–5.81)
RDW: 13 % (ref 11.5–15.5)
WBC: 13.2 10*3/uL — ABNORMAL HIGH (ref 4.0–10.5)
nRBC: 0 % (ref 0.0–0.2)

## 2023-04-25 LAB — URINALYSIS, ROUTINE W REFLEX MICROSCOPIC
Bacteria, UA: NONE SEEN
Bilirubin Urine: NEGATIVE
Glucose, UA: NEGATIVE mg/dL
Ketones, ur: NEGATIVE mg/dL
Leukocytes,Ua: NEGATIVE
Nitrite: NEGATIVE
RBC / HPF: >50 RBC/hpf (ref 0–5)
Specific Gravity, Urine: 1.022 (ref 1.005–1.030)
pH: 6 (ref 5.0–8.0)

## 2023-04-25 LAB — LIPASE, BLOOD: Lipase: <10 U/L — ABNORMAL LOW (ref 11–51)

## 2023-04-25 MED ORDER — MORPHINE SULFATE 15 MG PO TABS
7.5000 mg | ORAL_TABLET | ORAL | 0 refills | Status: DC | PRN
  Filled 2023-04-25: qty 7, 3d supply, fill #0

## 2023-04-25 MED ORDER — TAMSULOSIN HCL 0.4 MG PO CAPS
0.4000 mg | ORAL_CAPSULE | Freq: Every day | ORAL | 0 refills | Status: AC
Start: 2023-04-25 — End: ?

## 2023-04-25 MED ORDER — OXYCODONE HCL 5 MG PO TABS
5.0000 mg | ORAL_TABLET | Freq: Once | ORAL | Status: AC
  Administered 2023-04-25: 5 mg via ORAL
  Filled 2023-04-25: qty 1

## 2023-04-25 MED ORDER — MORPHINE SULFATE 15 MG PO TABS: 7.5000 mg | ORAL_TABLET | ORAL | 0 refills | Status: DC | PRN

## 2023-04-25 MED ORDER — TAMSULOSIN HCL 0.4 MG PO CAPS
0.4000 mg | ORAL_CAPSULE | Freq: Every day | ORAL | 0 refills | Status: DC
  Filled 2023-04-25: qty 30, 30d supply, fill #0

## 2023-04-25 MED ORDER — ACETAMINOPHEN 500 MG PO TABS
1000.0000 mg | ORAL_TABLET | Freq: Once | ORAL | Status: AC
  Administered 2023-04-25: 1000 mg via ORAL
  Filled 2023-04-25: qty 2

## 2023-04-25 MED ORDER — ONDANSETRON 4 MG PO TBDP: 4.0000 mg | ORAL_TABLET | ORAL | 0 refills | Status: AC | PRN

## 2023-04-25 MED ORDER — ONDANSETRON 4 MG PO TBDP
4.0000 mg | ORAL_TABLET | ORAL | 0 refills | Status: DC | PRN
  Filled 2023-04-25: qty 20, 4d supply, fill #0

## 2023-04-25 NOTE — ED Notes (Signed)
Pt aware of the need for a urine... Unable to currently provide the sample.[name] KitchenMarland Kitchen

## 2023-04-25 NOTE — ED Provider Notes (Signed)
[place] EMERGENCY DEPARTMENT AT [facility] Provider Note   CSN: [id number] Arrival date & time: 04/25/23  1202     History  Chief Complaint  Patient presents with   Flank Pain    Hunter Chang is a 78 y.o. male.  78 yo M with a chief complaint of right flank pain.  This started this morning.  Has had some loose bowel movements.  No fevers no trauma.  Nothing seems to make it better or worse.  Denies radiation of the pain.  Sounds like it was a bit more severe earlier.   Flank Pain       Home Medications Prior to Admission medications   Medication Sig Start Date End Date Taking? Authorizing Provider  ondansetron (ZOFRAN) 8 MG tablet Take 8 mg by mouth 2 (two) times daily as needed. 04/10/23  Yes [provider]  apixaban (ELIQUIS) 5 MG TABS tablet Take 1 tablet (5 mg total) by mouth 2 (two) times daily. 03/24/23   Corwin Levins, MD  Cholecalciferol 100 MCG (4000 UT) TABS Take by mouth.    [provider]  Cyanocobalamin 5000 MCG CAPS Take 1 capsule by mouth daily.    [provider]  hydrocortisone 2.5 % cream SMARTSIG:Sparingly Topical Twice Daily 02/24/22   [provider]  latanoprost (XALATAN) 0.005 % ophthalmic solution Place 1 drop into both eyes at bedtime. 09/17/21   [provider]  levETIRAcetam (KEPPRA) 500 MG tablet Take 1 tablet (500 mg total) by mouth 2 (two) times daily. 10/11/21   Amin, Ankit C, MD  magnesium gluconate (MAGONATE) 500 MG tablet Take 500 mg by mouth daily.    [provider]  morphine (MSIR) 15 MG tablet Take 0.5 tablets (7.5 mg total) by mouth every 4 (four) hours as needed for severe pain (pain score 7-10). 04/25/23   Melene Plan, DO  Multiple Vitamins-Minerals (ONE-A-DAY MENS 50+ ADVANTAGE) TABS Take 1 tablet by mouth daily.    [provider]  Multiple Vitamins-Minerals (ZINC PO) Take 50 mg by mouth daily.    [provider]  omeprazole (PRILOSEC) 20 MG capsule  TAKE 1 CAPSULE (20 MG TOTAL) BY MOUTH DAILY. TAKE 30 MINUTES PRIOR TO A MEAL 02/01/23   Burns, Bobette Mo, MD  ondansetron (ZOFRAN-ODT) 4 MG disintegrating tablet Dissolve 1 tablet (4 mg total) by mouth every 4 (four) hours as needed. 04/25/23   Melene Plan, DO  potassium chloride SA (KLOR-CON M) 20 MEQ tablet Take 1 tablet (20 mEq total) by mouth daily. 11/04/22   Pincus Sanes, MD  predniSONE (DELTASONE) 20 MG tablet Take 20 mg by mouth daily with breakfast.    [provider]  predniSONE (DELTASONE) 20 MG tablet Take 1 tablet (20 mg total) by mouth daily with breakfast. 03/24/23   Corwin Levins, MD  sertraline (ZOLOFT) 50 MG tablet Take 1 tablet (50 mg total) by mouth daily. 03/24/23   Corwin Levins, MD  sodium chloride irrigation 0.9 % irrigation Use for wet to dry scalp dressings BID 11/10/21   [provider]  tamsulosin (FLOMAX) 0.4 MG CAPS capsule Take 1 capsule (0.4 mg total) by mouth daily after supper. 04/25/23   Melene Plan, DO  VITAMIN D PO Take 1 tablet by mouth daily.    [provider]  zonisamide (ZONEGRAN) 100 MG capsule TAKE 2 CAPSULES AT BEDTIME FOR SEIZURES 06/07/22   Pincus Sanes, MD      Allergies    Cefepime, Pembrolizumab, Succinylcholine  chloride, Valproic acid, and Chlorhexidine gluconate    Review of Systems   Review of Systems  Genitourinary:  Positive for flank pain.    Physical Exam Updated Vital Signs BP 102/63   Pulse 71   Temp 98.5 F (36.9 C)   Resp 13   Ht 5\' 8"  (1.727 m)   Wt 68.9 kg   SpO2 95%   BMI 23.11 kg/m  Physical Exam Vitals and nursing note reviewed.  Constitutional:      Appearance: He is well-developed.  HENT:     Head: Normocephalic and atraumatic.  Eyes:     Pupils: Pupils are equal, round, and reactive to light.  Neck:     Vascular: No JVD.  Cardiovascular:     Rate and Rhythm: Normal rate and regular rhythm.     Heart sounds: No murmur heard.    No friction rub. No gallop.  Pulmonary:     Effort: No  respiratory distress.     Breath sounds: No wheezing.  Abdominal:     General: There is no distension.     Tenderness: There is no abdominal tenderness. There is no guarding or rebound.  Musculoskeletal:        General: Normal range of motion.     Cervical back: Normal range of motion and neck supple.     Comments: Pulse motor and sensation intact to the right lower extremity.  Reflexes 2+ and equal.  No clonus.  No obvious midline spinal tenderness develops or deformities.  Skin:    Coloration: Skin is not pale.     Findings: No rash.  Neurological:     Mental Status: He is alert and oriented to person, place, and time.  Psychiatric:        Behavior: Behavior normal.     ED Results / Procedures / Treatments   Labs (all labs ordered are listed, but only abnormal results are displayed) Labs Reviewed  CBC WITH DIFFERENTIAL/PLATELET - Abnormal; Notable for the following components:      Result Value   WBC 13.2 (*)    Neutro Abs 11.2 (*)    Lymphs Abs 0.6 (*)    Monocytes Absolute 1.2 (*)    Abs Immature Granulocytes 0.15 (*)    All other components within normal limits  COMPREHENSIVE METABOLIC PANEL - Abnormal; Notable for the following components:   Potassium 3.0 (*)    Glucose, Bld 160 (*)    BUN 25 (*)    Creatinine, Ser 1.36 (*)    Total Protein 6.2 (*)    AST 13 (*)    GFR, Estimated 54 (*)    All other components within normal limits  LIPASE, BLOOD - Abnormal; Notable for the following components:   Lipase <10 (*)    All other components within normal limits  URINALYSIS, ROUTINE W REFLEX MICROSCOPIC - Abnormal; Notable for the following components:   Hgb urine dipstick LARGE (*)    Protein, ur TRACE (*)    Crystals PRESENT (*)    All other components within normal limits    EKG None  Radiology CT L-SPINE NO CHARGE Result Date: 04/25/2023 CLINICAL DATA:  Right flank pain. EXAM: CT LUMBAR SPINE WITHOUT CONTRAST TECHNIQUE: Multidetector CT imaging of the lumbar  spine was performed without intravenous contrast administration. Multiplanar CT image reconstructions were also generated. RADIATION DOSE REDUCTION: This exam was performed according to the departmental dose-optimization program which includes automated exposure control, adjustment of the mA and/or kV according to patient size  and/or use of iterative reconstruction technique. COMPARISON:  Nuclear medicine PET-CT from 09/06/2022 FINDINGS: Segmentation: The lowest lumbar type non-rib-bearing vertebra is labeled as L5. Alignment: 2 mm degenerative retrolisthesis at L1-2 and L2-3. 5 mm anterolisthesis at L5-S1 so seated with chronic bilateral L5 pars defects. Vertebrae: Chronic bilateral L5 pars defects noted. Loss of disc height and vacuum disc phenomenon at the L5-S1 level. Adjoining Schmorl's nodes at T12-L1 and L1-2 along the endplates. No fracture or acute bony findings. Paraspinal and other soft tissues: No immediate paraspinal significant findings, please see dedicated CT abdomen/pelvis report regarding other findings particularly the right renal findings. Disc levels: L1-2: No impingement.  Mild disc bulge. L2-3: Moderate left foraminal stenosis due to facet arthropathy and left foraminal disc protrusion. Mild disc bulge. L3-4: Mild bilateral foraminal stenosis and potential mild right subarticular lateral recess stenosis due to disc bulge and right lateral recess and inferior foraminal disc protrusion. L4-5: Moderate left and mild right foraminal stenosis and potential right subarticular lateral recess stenosis due to disc bulge, right ligamentum flavum redundancy, and facet arthropathy. L5-S1: Mild but right greater than left foraminal stenosis secondary to disc uncovering and facet arthropathy as well as intervertebral spurring. IMPRESSION: 1. Lumbar spondylosis and degenerative disc disease causing moderate impingement at L2-3 and L4-5; and mild impingement at L3-4 and L5-S1. 2. 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 3. No acute findings. Electronically Signed   By: Gaylyn Rong M.D.   On: 04/25/2023 14:00   CT Renal Stone Study Result Date: 04/25/2023 CLINICAL DATA:  Right flank pain, abdominal pain. Diarrhea this morning. EXAM: CT ABDOMEN AND PELVIS WITHOUT CONTRAST TECHNIQUE: Multidetector CT imaging of the abdomen and pelvis was performed following the standard protocol without IV contrast. RADIATION DOSE REDUCTION: This exam was performed according to the departmental dose-optimization program which includes automated exposure control, adjustment of the mA and/or kV according to patient size and/or use of iterative reconstruction technique. COMPARISON:  PET-CT images from 09/06/2022 FINDINGS: Lower chest: Left anterior descending, right, and circumflex coronary artery atherosclerotic vascular calcifications. Mild dependent subsegmental atelectasis or scarring in both lower lobes. Hepatobiliary: Unremarkable Pancreas: Unremarkable Spleen: Unremarkable Adrenals/Urinary Tract: Moderate to prominent right hydronephrosis and moderate right hydroureter extending down to a 3 by 2 by 2 mm right distal ureteral calculus located 1.8 cm proximal to the right ureterovesical junction. The distal segment of the ureter past this stone is of normal caliber. Urinary bladder is near empty with no urinary bladder stone noted. There is associated right perirenal edema/stranding. 1-2 mm punctate right kidney upper pole nonobstructive renal calculus. 2 mm left kidney lower pole nonobstructive renal calculus. Stomach/Bowel: Indirect left inguinal hernia contains a small loop of the proximal sigmoid colon, without current findings of strangulation or obstruction. Vascular/Lymphatic: Mild aortoiliac atheromatous vascular calcifications. Reproductive: Unremarkable Other: No supplemental non-categorized findings. Musculoskeletal: Moderate degenerative hip arthropathy bilaterally. Dextroconvex  lumbar scoliosis. Chronic bilateral pars defects at L5 with grade 1 anterolisthesis of L5 on S1. Further lumbar spine findings in the dedicated lumbar CT report. IMPRESSION: 1. Moderate to prominent right hydronephrosis and moderate right hydroureter extending down to a 3 by 2 by 2 mm right distal ureteral calculus located 1.8 cm proximal to the right ureterovesical junction. 2. Punctate nonobstructive nephrolithiasis bilaterally. 3. Indirect left inguinal hernia contains a small loop of the proximal sigmoid colon, without current findings of strangulation or obstruction. 4. Coronary artery atherosclerotic vascular calcifications. 5. Chronic bilateral pars defects at L5 with grade 1  anterolisthesis of L5 on S1. 6. Moderate degenerative hip arthropathy bilaterally. 7. Dextroconvex lumbar scoliosis. 8. Aortic atherosclerosis. Aortic Atherosclerosis (ICD10-I70.0). Electronically Signed   By: Gaylyn Rong M.D.   On: 04/25/2023 13:55    Procedures Procedures    Medications Ordered in ED Medications  acetaminophen (TYLENOL) tablet 1,000 mg (1,000 mg Oral Given 04/25/23 1302)  oxyCODONE (Oxy IR/ROXICODONE) immediate release tablet 5 mg (5 mg Oral Given 04/25/23 1303)    ED Course/ Medical Decision Making/ A&P                                 Medical Decision Making Amount and/or Complexity of Data Reviewed Labs: ordered. Radiology: ordered.  Risk OTC drugs. Prescription drug management.   78 yo M with a chief complaint of right flank pain.  Going on since this morning.  By history it seems like the patient perhaps has a kidney stone.  He described it as severe at 1 point occurred with some diarrhea.  He does not seem to be uncomfortable now but tells me that he is in a lot of pain.  Will obtain CT imaging.  Blood work.  UA.  Treat pain.  Reassess.  Patient feeling much better on repeat assessment.  CT scan concerning for right sided nephrolithiasis.    UA without infection.  Patient has a  modest bump in his renal function.  Will discharge the patient home.  Urology follow-up.  2:42 PM:  I have discussed the diagnosis/risks/treatment options with the patient.  Evaluation and diagnostic testing in the emergency department does not suggest an emergent condition requiring admission or immediate intervention beyond what has been performed at this time.  They will follow up with PCP. We also discussed returning to the ED immediately if new or worsening sx occur. We discussed the sx which are most concerning (e.g., sudden worsening pain, fever, inability to tolerate by mouth) that necessitate immediate return. Medications administered to the patient during their visit and any new prescriptions provided to the patient are listed below.  Medications given during this visit Medications  acetaminophen (TYLENOL) tablet 1,000 mg (1,000 mg Oral Given 04/25/23 1302)  oxyCODONE (Oxy IR/ROXICODONE) immediate release tablet 5 mg (5 mg Oral Given 04/25/23 1303)     The patient appears reasonably screen and/or stabilized for discharge and I doubt any other medical condition or other [facility] requiring further screening, evaluation, or treatment in the ED at this time prior to discharge.          Final Clinical Impression(s) / ED Diagnoses Final diagnoses:  Nephrolithiasis    Rx / DC Orders ED Discharge Orders          Ordered    morphine (MSIR) 15 MG tablet  Every 4 hours PRN,   Status:  Discontinued        04/25/23 1425    ondansetron (ZOFRAN-ODT) 4 MG disintegrating tablet  Every 4 hours PRN,   Status:  Discontinued        04/25/23 1425    tamsulosin (FLOMAX) 0.4 MG CAPS capsule  Daily after supper,   Status:  Discontinued        04/25/23 1425    tamsulosin (FLOMAX) 0.4 MG CAPS capsule  Daily after supper        04/25/23 1439    ondansetron (ZOFRAN-ODT) 4 MG disintegrating tablet  Every 4 hours PRN        04/25/23 1439  morphine (MSIR) 15 MG tablet  Every 4 hours PRN         04/25/23 1439              Melene Plan, DO 04/25/23 1443

## 2023-04-25 NOTE — ED Triage Notes (Signed)
Pt arrived via [facility] c/o R flank pain that he woke up with this morning. Pt denies urinary s/s. Pt denies fever, N/V. Pt reports 2 episodes of diarrhea since this morning as well.   100 mcg fentanyl admin via EMS EMS VS BP 138/74 HR 70 SpO2 98%   22g IV R fa

## 2023-04-25 NOTE — ED Notes (Signed)
Discharge paperwork given and verbally understood.

## 2023-04-25 NOTE — Discharge Instructions (Signed)
Call the urology office to set up an appointment.  Please return for fever uncontrolled pain inability eat or drink. Also take tylenol 1000mg (2 extra strength) four times a day.   Then take the pain medicine if you feel like you need it. Narcotics do not help with the pain, they only make you care about it less.  You can become addicted to this, people may break into your house to steal it.  It will constipate you.  If you drive under the influence of this medicine you can get a DUI.

## 2023-04-28 ENCOUNTER — Telehealth: Payer: Self-pay

## 2023-04-28 NOTE — Patient Outreach (Signed)
  Care Coordination   04/28/2023 Name: Hunter Chang MRN: [id number] DOB: [birth year]   Care Coordination Outreach Attempts:  A third unsuccessful outreach was attempted today to offer the patient with information about available complex care management services. Third unsuccessful outreach attempts to reschedule and assess for care management needs.  Follow Up Plan:  No further outreach attempts will be made at this time. We have been unable to contact the patient to offer or enroll patient in complex care management services.  Encounter Outcome:  No Answer   Care Coordination Interventions:  No, not indicated    Kathyrn Sheriff, RN, MSN, BSN, CCM [facility], Population Health Case Manager Phone: [phone]

## 2023-05-01 ENCOUNTER — Encounter: Payer: Self-pay | Admitting: Internal Medicine

## 2023-05-03 ENCOUNTER — Other Ambulatory Visit (INDEPENDENT_AMBULATORY_CARE_PROVIDER_SITE_OTHER): Payer: PPO

## 2023-05-03 ENCOUNTER — Telehealth: Payer: Self-pay

## 2023-05-03 ENCOUNTER — Other Ambulatory Visit: Payer: Self-pay | Admitting: Internal Medicine

## 2023-05-03 DIAGNOSIS — E11622 Type 2 diabetes mellitus with other skin ulcer: Secondary | ICD-10-CM | POA: Diagnosis not present

## 2023-05-03 DIAGNOSIS — Z794 Long term (current) use of insulin: Secondary | ICD-10-CM

## 2023-05-03 DIAGNOSIS — E1165 Type 2 diabetes mellitus with hyperglycemia: Secondary | ICD-10-CM | POA: Diagnosis not present

## 2023-05-03 DIAGNOSIS — L98494 Non-pressure chronic ulcer of skin of other sites with necrosis of bone: Secondary | ICD-10-CM | POA: Diagnosis not present

## 2023-05-03 LAB — COMPREHENSIVE METABOLIC PANEL WITH GFR
ALT: 42 U/L (ref 0–53)
AST: 15 U/L (ref 0–37)
Albumin: 3.8 g/dL (ref 3.5–5.2)
Alkaline Phosphatase: 114 U/L (ref 39–117)
BUN: 27 mg/dL — ABNORMAL HIGH (ref 6–23)
CO2: 28 meq/L (ref 19–32)
Calcium: 10.1 mg/dL (ref 8.4–10.5)
Chloride: 102 meq/L (ref 96–112)
Creatinine, Ser: 1.11 mg/dL (ref 0.40–1.50)
GFR: 64.35 mL/min
Glucose, Bld: 117 mg/dL — ABNORMAL HIGH (ref 70–99)
Potassium: 3.9 meq/L (ref 3.5–5.1)
Sodium: 137 meq/L (ref 135–145)
Total Bilirubin: 0.6 mg/dL (ref 0.2–1.2)
Total Protein: 6.7 g/dL (ref 6.0–8.3)

## 2023-05-03 LAB — CBC WITH DIFFERENTIAL/PLATELET
Basophils Absolute: 0 K/uL (ref 0.0–0.1)
Basophils Relative: 0.2 % (ref 0.0–3.0)
Eosinophils Absolute: 0 K/uL (ref 0.0–0.7)
Eosinophils Relative: 0.2 % (ref 0.0–5.0)
HCT: 42.4 % (ref 39.0–52.0)
Hemoglobin: 14.4 g/dL (ref 13.0–17.0)
Lymphocytes Relative: 4.3 % — ABNORMAL LOW (ref 12.0–46.0)
Lymphs Abs: 0.5 K/uL — ABNORMAL LOW (ref 0.7–4.0)
MCHC: 34 g/dL (ref 30.0–36.0)
MCV: 93.2 fl (ref 78.0–100.0)
Monocytes Absolute: 0.7 K/uL (ref 0.1–1.0)
Monocytes Relative: 6.6 % (ref 3.0–12.0)
Neutro Abs: 9.7 K/uL — ABNORMAL HIGH (ref 1.4–7.7)
Neutrophils Relative %: 88.7 % — ABNORMAL HIGH (ref 43.0–77.0)
Platelets: 370 K/uL (ref 150.0–400.0)
RBC: 4.55 Mil/uL (ref 4.22–5.81)
RDW: 14.4 % (ref 11.5–15.5)
WBC: 10.9 K/uL — ABNORMAL HIGH (ref 4.0–10.5)

## 2023-05-03 LAB — MICROALBUMIN / CREATININE URINE RATIO
Creatinine,U: 135.3 mg/dL
Microalb Creat Ratio: 4.7 mg/g (ref 0.0–30.0)
Microalb, Ur: 6.3 mg/dL — ABNORMAL HIGH (ref 0.0–1.9)

## 2023-05-03 LAB — HEMOGLOBIN A1C: Hgb A1c MFr Bld: 6.9 % — ABNORMAL HIGH (ref 4.6–6.5)

## 2023-05-03 NOTE — Telephone Encounter (Signed)
 Called pt wife about issue, stated that they want labs ordered. Made provider aware of this issue, labs ordered and pt aware.

## 2023-05-03 NOTE — Telephone Encounter (Signed)
 Copied from CRM [id number]. Topic: Clinical - Request for Lab/Test Order >> May 03, 2023 12:20 PM Corin V wrote: Reason for CRM: Patient's wife Zada called in. Patient passed a kidney stone last week and she is worried about a possible kidney infection. She stated his urine has to normal color and odor, but he is sleeping all the time. She would like to try and get this lab done today if possible since they will already be out of the house for another appointment. Please call Joy back to schedule a lab if Dr. Geofm would like to order a urinalysis or other lab.

## 2023-05-04 ENCOUNTER — Telehealth: Payer: Self-pay | Admitting: Internal Medicine

## 2023-05-04 LAB — URINALYSIS, ROUTINE W REFLEX MICROSCOPIC
Bilirubin Urine: NEGATIVE
Leukocytes,Ua: NEGATIVE
Nitrite: NEGATIVE
Specific Gravity, Urine: 1.015 (ref 1.000–1.030)
Urine Glucose: NEGATIVE
Urobilinogen, UA: 0.2 (ref 0.0–1.0)
pH: 6.5 (ref 5.0–8.0)

## 2023-05-04 NOTE — Telephone Encounter (Signed)
 Copied from CRM [id number]. Topic: Clinical - Lab/Test Results >> May 04, 2023 11:11 AM Juvenal Opoka wrote: Reason for CRM: Patients representative would like to receive a call from [facility] regarding lab results for patient

## 2023-05-04 NOTE — Telephone Encounter (Signed)
 Urine shows blood, but no infection.    Kidney function, liver tests are normal.  White blood cell counts slightly elevated, but better than 1/28.  Other blood counts normal.   A1c 6.9% - sugars controlled.

## 2023-05-04 NOTE — Telephone Encounter (Signed)
 Urine does not show an infection so no antibiotic at this time.    Urology is up to him - there was some dilation of the ureter from the stone - but as long as he passed the stone that will correct itself.

## 2023-05-05 DIAGNOSIS — Z79899 Other long term (current) drug therapy: Secondary | ICD-10-CM | POA: Diagnosis not present

## 2023-05-05 DIAGNOSIS — G40109 Localization-related (focal) (partial) symptomatic epilepsy and epileptic syndromes with simple partial seizures, not intractable, without status epilepticus: Secondary | ICD-10-CM | POA: Diagnosis not present

## 2023-05-05 NOTE — Telephone Encounter (Signed)
 Spoke with patient's wife Kenney Peacemaker today.

## 2023-05-06 DIAGNOSIS — G40909 Epilepsy, unspecified, not intractable, without status epilepticus: Secondary | ICD-10-CM | POA: Insufficient documentation

## 2023-05-06 DIAGNOSIS — D496 Neoplasm of unspecified behavior of brain: Secondary | ICD-10-CM | POA: Insufficient documentation

## 2023-05-09 ENCOUNTER — Other Ambulatory Visit: Payer: Self-pay | Admitting: Internal Medicine

## 2023-05-10 DIAGNOSIS — Z7952 Long term (current) use of systemic steroids: Secondary | ICD-10-CM | POA: Diagnosis not present

## 2023-05-10 DIAGNOSIS — D4819 Other specified neoplasm of uncertain behavior of connective and other soft tissue: Secondary | ICD-10-CM | POA: Diagnosis not present

## 2023-05-10 DIAGNOSIS — M8668 Other chronic osteomyelitis, other site: Secondary | ICD-10-CM | POA: Diagnosis not present

## 2023-05-10 DIAGNOSIS — L98494 Non-pressure chronic ulcer of skin of other sites with necrosis of bone: Secondary | ICD-10-CM | POA: Diagnosis not present

## 2023-05-12 DIAGNOSIS — L98494 Non-pressure chronic ulcer of skin of other sites with necrosis of bone: Secondary | ICD-10-CM | POA: Diagnosis not present

## 2023-05-19 ENCOUNTER — Encounter: Payer: Self-pay | Admitting: Internal Medicine

## 2023-05-24 ENCOUNTER — Other Ambulatory Visit: Payer: Self-pay | Admitting: Internal Medicine

## 2023-05-24 ENCOUNTER — Other Ambulatory Visit: Payer: Self-pay

## 2023-05-24 NOTE — Telephone Encounter (Signed)
 Copied from CRM [id number]. Topic: Clinical - Medication Refill >> May 24, 2023  2:39 PM Truddie Crumble wrote: Most Recent Primary Care Visit:  Provider: Mickeal Needy  Department: [facility]  Visit Type: MEDICARE AWV, SEQUENTIAL  Date: 11/15/2022  Medication: ELIQUIS ( patient wife wants to see if the patient can get a 90 day refill)  Has the patient contacted their pharmacy? Yes (Agent: If no, request that the patient contact the pharmacy for the refill. If patient does not wish to contact the pharmacy document the reason why and proceed with request.) (Agent: If yes, when and what did the pharmacy advise?)  Is this the correct pharmacy for this prescription? Yes If no, delete pharmacy and type the correct one.  This is the patient's preferred pharmacy:  [id number] [place] [place] - [address] AT [address] [facility] [address] [place] [address] Phone: [phone] Fax: [phone]  Has the prescription been filled recently? yes  Is the patient out of the medication? Yes  Has the patient been seen for an appointment in the last year OR does the patient have an upcoming appointment? Yes  Can we respond through MyChart? Yes  Agent: Please be advised that Rx refills may take up to 3 business days. We ask that you follow-up with your pharmacy.

## 2023-05-26 ENCOUNTER — Other Ambulatory Visit: Payer: Self-pay

## 2023-05-26 ENCOUNTER — Telehealth: Payer: Self-pay

## 2023-05-26 DIAGNOSIS — Z5982 Transportation insecurity: Secondary | ICD-10-CM | POA: Diagnosis not present

## 2023-05-26 DIAGNOSIS — M1611 Unilateral primary osteoarthritis, right hip: Secondary | ICD-10-CM | POA: Diagnosis not present

## 2023-05-26 DIAGNOSIS — E099 Drug or chemical induced diabetes mellitus without complications: Secondary | ICD-10-CM | POA: Diagnosis not present

## 2023-05-26 DIAGNOSIS — Z556 Problems related to health literacy: Secondary | ICD-10-CM | POA: Diagnosis not present

## 2023-05-26 DIAGNOSIS — M25552 Pain in left hip: Secondary | ICD-10-CM | POA: Diagnosis not present

## 2023-05-26 DIAGNOSIS — E876 Hypokalemia: Secondary | ICD-10-CM | POA: Diagnosis not present

## 2023-05-26 DIAGNOSIS — K219 Gastro-esophageal reflux disease without esophagitis: Secondary | ICD-10-CM | POA: Diagnosis not present

## 2023-05-26 DIAGNOSIS — Z8616 Personal history of COVID-19: Secondary | ICD-10-CM | POA: Diagnosis not present

## 2023-05-26 DIAGNOSIS — D485 Neoplasm of uncertain behavior of skin: Secondary | ICD-10-CM | POA: Diagnosis not present

## 2023-05-26 DIAGNOSIS — Z9181 History of falling: Secondary | ICD-10-CM | POA: Diagnosis not present

## 2023-05-26 DIAGNOSIS — F32A Depression, unspecified: Secondary | ICD-10-CM | POA: Diagnosis not present

## 2023-05-26 DIAGNOSIS — S72002D Fracture of unspecified part of neck of left femur, subsequent encounter for closed fracture with routine healing: Secondary | ICD-10-CM | POA: Diagnosis not present

## 2023-05-26 DIAGNOSIS — G8194 Hemiplegia, unspecified affecting left nondominant side: Secondary | ICD-10-CM | POA: Diagnosis not present

## 2023-05-26 DIAGNOSIS — R109 Unspecified abdominal pain: Secondary | ICD-10-CM | POA: Diagnosis not present

## 2023-05-26 DIAGNOSIS — W1830XD Fall on same level, unspecified, subsequent encounter: Secondary | ICD-10-CM | POA: Diagnosis not present

## 2023-05-26 DIAGNOSIS — M1712 Unilateral primary osteoarthritis, left knee: Secondary | ICD-10-CM | POA: Diagnosis not present

## 2023-05-26 DIAGNOSIS — M6389 Disorders of muscle in diseases classified elsewhere, multiple sites: Secondary | ICD-10-CM | POA: Diagnosis not present

## 2023-05-26 DIAGNOSIS — N189 Chronic kidney disease, unspecified: Secondary | ICD-10-CM | POA: Diagnosis not present

## 2023-05-26 DIAGNOSIS — N529 Male erectile dysfunction, unspecified: Secondary | ICD-10-CM | POA: Diagnosis not present

## 2023-05-26 DIAGNOSIS — Z7901 Long term (current) use of anticoagulants: Secondary | ICD-10-CM | POA: Diagnosis not present

## 2023-05-26 DIAGNOSIS — G062 Extradural and subdural abscess, unspecified: Secondary | ICD-10-CM | POA: Diagnosis not present

## 2023-05-26 DIAGNOSIS — Z993 Dependence on wheelchair: Secondary | ICD-10-CM | POA: Diagnosis not present

## 2023-05-26 DIAGNOSIS — G509 Disorder of trigeminal nerve, unspecified: Secondary | ICD-10-CM | POA: Diagnosis not present

## 2023-05-26 DIAGNOSIS — Z96642 Presence of left artificial hip joint: Secondary | ICD-10-CM | POA: Diagnosis not present

## 2023-05-26 DIAGNOSIS — R7303 Prediabetes: Secondary | ICD-10-CM | POA: Diagnosis not present

## 2023-05-26 DIAGNOSIS — H409 Unspecified glaucoma: Secondary | ICD-10-CM | POA: Diagnosis not present

## 2023-05-26 DIAGNOSIS — Z7952 Long term (current) use of systemic steroids: Secondary | ICD-10-CM | POA: Diagnosis not present

## 2023-05-26 DIAGNOSIS — F419 Anxiety disorder, unspecified: Secondary | ICD-10-CM | POA: Diagnosis not present

## 2023-05-26 DIAGNOSIS — N200 Calculus of kidney: Secondary | ICD-10-CM | POA: Diagnosis not present

## 2023-05-26 DIAGNOSIS — G40909 Epilepsy, unspecified, not intractable, without status epilepticus: Secondary | ICD-10-CM | POA: Diagnosis not present

## 2023-05-26 DIAGNOSIS — D496 Neoplasm of unspecified behavior of brain: Secondary | ICD-10-CM | POA: Diagnosis not present

## 2023-05-26 DIAGNOSIS — G08 Intracranial and intraspinal phlebitis and thrombophlebitis: Secondary | ICD-10-CM | POA: Diagnosis not present

## 2023-05-26 MED ORDER — APIXABAN 5 MG PO TABS: 5.0000 mg | ORAL_TABLET | Freq: Two times a day (BID) | ORAL | 0 refills | Status: DC

## 2023-05-26 NOTE — Telephone Encounter (Signed)
 Spoke with pt's wife, pt is requesting refill of Eliquis  5mg  twice a day, requesting 90 day supply. Called pharmacy to verify rx was received, pharmacy has request. Called pt's wife back and let her know pharmacy has received.

## 2023-05-26 NOTE — Telephone Encounter (Signed)
 Copied from CRM [id number]. Topic: Clinical - Prescription Issue >> May 26, 2023  4:11 PM Presley Raddle C wrote: Reason for CRM: [facility] and asked to speak with a nurse because they claimed they didn't see the prescription that was sent in on 2/26 for Eliquis. I contacted the front desk and as I was on hold, the pharmacist hung up. Please follow-up and advise.

## 2023-05-26 NOTE — Telephone Encounter (Signed)
 Copied from CRM [id number]. Topic: Clinical - Prescription Issue >> May 26, 2023  3:29 PM Martin[place] E wrote: Reason for CRM: Patient's wife called in stating that they still have not received her husband's medication, ELIQUIS 5 MG TABS tablet. Agent can see this request was put in two days ago and advised wife that refills can take up to 3 days. Wife was stating how the pharmacy tried getting a hold of Dr. Lawerance Bach in order to get this filled before the weekend. Wife stating her husband cannot go a day without it and needs to pick it up before the pharmacy closes today 2/28.

## 2023-05-26 NOTE — Telephone Encounter (Signed)
 Copied from CRM [id number]. Topic: Clinical - Home Health Verbal Orders >> May 26, 2023  3:43 PM Hector Shade B wrote: Caller/Agency: Soni from [facility] Callback Number: [phone] Service Requested: Physical Therapy Frequency: 1 wk 7 and for home health aid 2xs a week for 5 weeks Any new concerns about the patient? No

## 2023-05-29 NOTE — Telephone Encounter (Signed)
 Ok for verbal orders ?

## 2023-05-29 NOTE — Telephone Encounter (Signed)
 Okay for orders?

## 2023-05-30 NOTE — Telephone Encounter (Signed)
 LM for them to return my call as it did not say their VM was a secure line

## 2023-06-01 NOTE — Telephone Encounter (Signed)
 Called and was able to give [facility] the ok for verbal orders

## 2023-06-05 DIAGNOSIS — E099 Drug or chemical induced diabetes mellitus without complications: Secondary | ICD-10-CM

## 2023-06-05 DIAGNOSIS — G062 Extradural and subdural abscess, unspecified: Secondary | ICD-10-CM

## 2023-06-05 DIAGNOSIS — E875 Hyperkalemia: Secondary | ICD-10-CM

## 2023-06-05 DIAGNOSIS — H409 Unspecified glaucoma: Secondary | ICD-10-CM

## 2023-06-05 DIAGNOSIS — G40909 Epilepsy, unspecified, not intractable, without status epilepticus: Secondary | ICD-10-CM

## 2023-06-05 DIAGNOSIS — G8194 Hemiplegia, unspecified affecting left nondominant side: Secondary | ICD-10-CM

## 2023-06-05 DIAGNOSIS — N200 Calculus of kidney: Secondary | ICD-10-CM

## 2023-06-05 DIAGNOSIS — N529 Male erectile dysfunction, unspecified: Secondary | ICD-10-CM

## 2023-06-05 DIAGNOSIS — D496 Neoplasm of unspecified behavior of brain: Secondary | ICD-10-CM

## 2023-06-05 DIAGNOSIS — D485 Neoplasm of uncertain behavior of skin: Secondary | ICD-10-CM

## 2023-06-05 DIAGNOSIS — G08 Intracranial and intraspinal phlebitis and thrombophlebitis: Secondary | ICD-10-CM

## 2023-06-05 DIAGNOSIS — R109 Unspecified abdominal pain: Secondary | ICD-10-CM

## 2023-06-05 NOTE — Telephone Encounter (Signed)
 Patient has since received eliquis prescription.

## 2023-06-09 ENCOUNTER — Ambulatory Visit: Payer: PPO | Admitting: Internal Medicine

## 2023-06-11 ENCOUNTER — Encounter: Payer: Self-pay | Admitting: Internal Medicine

## 2023-06-11 DIAGNOSIS — K219 Gastro-esophageal reflux disease without esophagitis: Secondary | ICD-10-CM | POA: Insufficient documentation

## 2023-06-11 NOTE — Assessment & Plan Note (Signed)
Chronic On keppra and zonegran Management per [facility]

## 2023-06-11 NOTE — Patient Instructions (Addendum)
     EKG done.    Right ear flushed.     Medications changes include :   None     Return in about 6 months (around 12/13/2023) for follow up.

## 2023-06-11 NOTE — Progress Notes (Unsigned)
 Subjective:    Patient ID: Hunter Chang, male    DOB: 12/01/1945, 78 y.o.   MRN: [id number]     HPI Hunter Chang is here for follow up of his chronic medical problems.  Dec eliquis to 2.5   Ur micro/cr ration miscalculated  ? Arb,   ? farxiga  Medications and allergies reviewed with patient and updated if appropriate.  Current Outpatient Medications on File Prior to Visit  Medication Sig Dispense Refill   apixaban (ELIQUIS) 5 MG TABS tablet Take 1 tablet (5 mg total) by mouth 2 (two) times daily. 180 tablet 0   Cholecalciferol 100 MCG (4000 UT) TABS Take by mouth.     Cyanocobalamin 5000 MCG CAPS Take 1 capsule by mouth daily.     hydrocortisone 2.5 % cream SMARTSIG:Sparingly Topical Twice Daily     latanoprost (XALATAN) 0.005 % ophthalmic solution Place 1 drop into both eyes at bedtime.     levETIRAcetam (KEPPRA) 500 MG tablet Take 1 tablet (500 mg total) by mouth 2 (two) times daily.     magnesium gluconate (MAGONATE) 500 MG tablet Take 500 mg by mouth daily.     morphine (MSIR) 15 MG tablet Take 0.5 tablets (7.5 mg total) by mouth every 4 (four) hours as needed for severe pain (pain score 7-10). 7 tablet 0   Multiple Vitamins-Minerals (ONE-A-DAY MENS 50+ ADVANTAGE) TABS Take 1 tablet by mouth daily.     Multiple Vitamins-Minerals (ZINC PO) Take 50 mg by mouth daily.     omeprazole (PRILOSEC) 20 MG capsule TAKE 1 CAPSULE (20 MG TOTAL) BY MOUTH DAILY. TAKE 30 MINUTES PRIOR TO A MEAL 90 capsule 1   ondansetron (ZOFRAN) 8 MG tablet Take 8 mg by mouth 2 (two) times daily as needed.     ondansetron (ZOFRAN-ODT) 4 MG disintegrating tablet Dissolve 1 tablet (4 mg total) by mouth every 4 (four) hours as needed. 20 tablet 0   potassium chloride SA (KLOR-CON M) 20 MEQ tablet Take 1 tablet (20 mEq total) by mouth daily. 90 tablet 3   predniSONE (DELTASONE) 20 MG tablet Take 20 mg by mouth daily with breakfast.     predniSONE (DELTASONE) 20 MG tablet Take 1 tablet (20 mg total) by mouth  daily with breakfast. 30 tablet 0   predniSONE (DELTASONE) 20 MG tablet TAKE 1 TABLET BY MOUTH DAILY WITH BREAKFAST 30 tablet 2   sertraline (ZOLOFT) 50 MG tablet Take 1 tablet (50 mg total) by mouth daily. 30 tablet 0   sodium chloride irrigation 0.9 % irrigation Use for wet to dry scalp dressings BID     tamsulosin (FLOMAX) 0.4 MG CAPS capsule Take 1 capsule (0.4 mg total) by mouth daily after supper. 30 capsule 0   VITAMIN D PO Take 1 tablet by mouth daily.     zonisamide (ZONEGRAN) 100 MG capsule TAKE 2 CAPSULES AT BEDTIME FOR SEIZURES 60 capsule 0   No current facility-administered medications on file prior to visit.     Review of Systems     Objective:  There were no vitals filed for this visit. BP Readings from Last 3 Encounters:  04/25/23 102/63  03/10/22 110/74  12/01/21 132/73   Wt Readings from Last 3 Encounters:  04/25/23 152 lb (68.9 kg)  11/15/22 152 lb (68.9 kg)  03/10/22 162 lb (73.5 kg)   There is no height or weight on file to calculate BMI.    Physical Exam     Lab Results  Component  Value Date   WBC 10.9 (H) 05/03/2023   HGB 14.4 05/03/2023   HCT 42.4 05/03/2023   PLT 370.0 05/03/2023   GLUCOSE 117 (H) 05/03/2023   CHOL 180 10/23/2020   TRIG 92.0 10/23/2020   HDL 44.80 10/23/2020   LDLCALC 117 (H) 10/23/2020   ALT 42 05/03/2023   AST 15 05/03/2023   NA 137 05/03/2023   K 3.9 05/03/2023   CL 102 05/03/2023   CREATININE 1.11 05/03/2023   BUN 27 (H) 05/03/2023   CO2 28 05/03/2023   TSH 0.52 09/29/2021   INR 1.0 10/07/2021   HGBA1C 6.9 (H) 05/03/2023   MICROALBUR 6.3 (H) 05/03/2023     Assessment & Plan:    See Problem List for Assessment and Plan of chronic medical problems.

## 2023-06-12 ENCOUNTER — Ambulatory Visit (INDEPENDENT_AMBULATORY_CARE_PROVIDER_SITE_OTHER): Payer: PPO | Admitting: Internal Medicine

## 2023-06-12 ENCOUNTER — Encounter: Payer: Self-pay | Admitting: Internal Medicine

## 2023-06-12 VITALS — BP 112/72 | HR 95 | Temp 98.0°F | Ht 68.0 in

## 2023-06-12 DIAGNOSIS — R531 Weakness: Secondary | ICD-10-CM | POA: Diagnosis not present

## 2023-06-12 DIAGNOSIS — E876 Hypokalemia: Secondary | ICD-10-CM

## 2023-06-12 DIAGNOSIS — D496 Neoplasm of unspecified behavior of brain: Secondary | ICD-10-CM

## 2023-06-12 DIAGNOSIS — E1165 Type 2 diabetes mellitus with hyperglycemia: Secondary | ICD-10-CM

## 2023-06-12 DIAGNOSIS — G40909 Epilepsy, unspecified, not intractable, without status epilepticus: Secondary | ICD-10-CM

## 2023-06-12 DIAGNOSIS — D485 Neoplasm of uncertain behavior of skin: Secondary | ICD-10-CM

## 2023-06-12 DIAGNOSIS — G08 Intracranial and intraspinal phlebitis and thrombophlebitis: Secondary | ICD-10-CM

## 2023-06-12 DIAGNOSIS — H6121 Impacted cerumen, right ear: Secondary | ICD-10-CM | POA: Diagnosis not present

## 2023-06-12 DIAGNOSIS — Z794 Long term (current) use of insulin: Secondary | ICD-10-CM

## 2023-06-12 DIAGNOSIS — R9431 Abnormal electrocardiogram [ECG] [EKG]: Secondary | ICD-10-CM

## 2023-06-12 DIAGNOSIS — K219 Gastro-esophageal reflux disease without esophagitis: Secondary | ICD-10-CM | POA: Diagnosis not present

## 2023-06-12 NOTE — Assessment & Plan Note (Signed)
 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

## 2023-06-12 NOTE — Assessment & Plan Note (Addendum)
 Diagnosed 08/2022 Continue Eliquis twice daily 5 mg-they did discuss this with oncology and since he still has evidence of a clot present they do feel that this should continue at the 5 mg dose Recent CBC, CMP reviewed

## 2023-06-12 NOTE — Assessment & Plan Note (Signed)
 Acute Intermittent hearing loss, clogged feeling Ear lavage performed today by cma

## 2023-06-12 NOTE — Assessment & Plan Note (Signed)
Chronic GERD controlled Continue omeprazole 20 mg daily  

## 2023-06-12 NOTE — Assessment & Plan Note (Signed)
 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

## 2023-06-12 NOTE — Assessment & Plan Note (Addendum)
 Chronic Controlled Continue potassium chloride 20 mEq daily  EKG -NSR at 100 bpm, LAD, possible LVH, cannot rule out inferior infarct and anteroseptal infarct-compared to last EKG from 2023 QRS is now more downgoing and more pronounced small Q's in anterior septal leads.  Has known coronary artery calcifications on prior EKG Would benefit from further evaluation

## 2023-06-12 NOTE — Assessment & Plan Note (Addendum)
 Status postsurgery x2 and radiation treatment Following with dermatology, neurology and radiation oncology

## 2023-06-12 NOTE — Assessment & Plan Note (Signed)
 Chronic Initially secondary to steroid use No longer on insulin Lab Results  Component Value Date   HGBA1C 6.9 (H) 05/03/2023   Diet controlled-can continue, but need to monitor

## 2023-06-14 ENCOUNTER — Emergency Department (HOSPITAL_COMMUNITY)
Admission: EM | Admit: 2023-06-14 | Discharge: 2023-06-14 | Disposition: A | Discharge status: Other Acute Inpatient Hospital | Attending: Emergency Medicine | Admitting: Emergency Medicine

## 2023-06-14 ENCOUNTER — Telehealth: Payer: Self-pay

## 2023-06-14 ENCOUNTER — Other Ambulatory Visit: Payer: Self-pay

## 2023-06-14 ENCOUNTER — Emergency Department (HOSPITAL_COMMUNITY)

## 2023-06-14 DIAGNOSIS — K5903 Drug induced constipation: Secondary | ICD-10-CM | POA: Diagnosis not present

## 2023-06-14 DIAGNOSIS — D485 Neoplasm of uncertain behavior of skin: Secondary | ICD-10-CM | POA: Diagnosis not present

## 2023-06-14 DIAGNOSIS — S72092A Other fracture of head and neck of left femur, initial encounter for closed fracture: Secondary | ICD-10-CM | POA: Diagnosis not present

## 2023-06-14 DIAGNOSIS — R238 Other skin changes: Secondary | ICD-10-CM | POA: Diagnosis not present

## 2023-06-14 DIAGNOSIS — W1830XA Fall on same level, unspecified, initial encounter: Secondary | ICD-10-CM | POA: Insufficient documentation

## 2023-06-14 DIAGNOSIS — R9431 Abnormal electrocardiogram [ECG] [EKG]: Secondary | ICD-10-CM | POA: Diagnosis not present

## 2023-06-14 DIAGNOSIS — M85852 Other specified disorders of bone density and structure, left thigh: Secondary | ICD-10-CM | POA: Diagnosis not present

## 2023-06-14 DIAGNOSIS — Z7952 Long term (current) use of systemic steroids: Secondary | ICD-10-CM | POA: Diagnosis not present

## 2023-06-14 DIAGNOSIS — Z8661 Personal history of infections of the central nervous system: Secondary | ICD-10-CM | POA: Diagnosis not present

## 2023-06-14 DIAGNOSIS — S72002A Fracture of unspecified part of neck of left femur, initial encounter for closed fracture: Secondary | ICD-10-CM | POA: Diagnosis not present

## 2023-06-14 DIAGNOSIS — Z7901 Long term (current) use of anticoagulants: Secondary | ICD-10-CM | POA: Diagnosis not present

## 2023-06-14 DIAGNOSIS — T402X5A Adverse effect of other opioids, initial encounter: Secondary | ICD-10-CM | POA: Diagnosis not present

## 2023-06-14 DIAGNOSIS — W19XXXA Unspecified fall, initial encounter: Secondary | ICD-10-CM | POA: Diagnosis not present

## 2023-06-14 DIAGNOSIS — D519 Vitamin B12 deficiency anemia, unspecified: Secondary | ICD-10-CM | POA: Diagnosis not present

## 2023-06-14 DIAGNOSIS — G509 Disorder of trigeminal nerve, unspecified: Secondary | ICD-10-CM | POA: Diagnosis not present

## 2023-06-14 DIAGNOSIS — Z89622 Acquired absence of left hip joint: Secondary | ICD-10-CM | POA: Diagnosis not present

## 2023-06-14 DIAGNOSIS — M25552 Pain in left hip: Secondary | ICD-10-CM | POA: Diagnosis present

## 2023-06-14 DIAGNOSIS — E559 Vitamin D deficiency, unspecified: Secondary | ICD-10-CM | POA: Diagnosis not present

## 2023-06-14 DIAGNOSIS — G9340 Encephalopathy, unspecified: Secondary | ICD-10-CM | POA: Diagnosis not present

## 2023-06-14 DIAGNOSIS — K1230 Oral mucositis (ulcerative), unspecified: Secondary | ICD-10-CM | POA: Diagnosis not present

## 2023-06-14 DIAGNOSIS — Y9301 Activity, walking, marching and hiking: Secondary | ICD-10-CM | POA: Insufficient documentation

## 2023-06-14 DIAGNOSIS — D649 Anemia, unspecified: Secondary | ICD-10-CM | POA: Diagnosis not present

## 2023-06-14 DIAGNOSIS — R531 Weakness: Secondary | ICD-10-CM | POA: Diagnosis not present

## 2023-06-14 DIAGNOSIS — R059 Cough, unspecified: Secondary | ICD-10-CM | POA: Diagnosis not present

## 2023-06-14 DIAGNOSIS — Z9181 History of falling: Secondary | ICD-10-CM | POA: Diagnosis not present

## 2023-06-14 DIAGNOSIS — Z01818 Encounter for other preprocedural examination: Secondary | ICD-10-CM | POA: Diagnosis not present

## 2023-06-14 DIAGNOSIS — S50312A Abrasion of left elbow, initial encounter: Secondary | ICD-10-CM | POA: Diagnosis not present

## 2023-06-14 DIAGNOSIS — Z87442 Personal history of urinary calculi: Secondary | ICD-10-CM | POA: Diagnosis not present

## 2023-06-14 DIAGNOSIS — G4709 Other insomnia: Secondary | ICD-10-CM | POA: Diagnosis not present

## 2023-06-14 DIAGNOSIS — Z043 Encounter for examination and observation following other accident: Secondary | ICD-10-CM | POA: Diagnosis not present

## 2023-06-14 DIAGNOSIS — Z96642 Presence of left artificial hip joint: Secondary | ICD-10-CM | POA: Diagnosis not present

## 2023-06-14 DIAGNOSIS — M25562 Pain in left knee: Secondary | ICD-10-CM | POA: Diagnosis not present

## 2023-06-14 DIAGNOSIS — S72002D Fracture of unspecified part of neck of left femur, subsequent encounter for closed fracture with routine healing: Secondary | ICD-10-CM | POA: Diagnosis not present

## 2023-06-14 DIAGNOSIS — R000 Tachycardia, unspecified: Secondary | ICD-10-CM | POA: Diagnosis not present

## 2023-06-14 DIAGNOSIS — D62 Acute posthemorrhagic anemia: Secondary | ICD-10-CM | POA: Diagnosis not present

## 2023-06-14 DIAGNOSIS — E119 Type 2 diabetes mellitus without complications: Secondary | ICD-10-CM | POA: Diagnosis not present

## 2023-06-14 DIAGNOSIS — S80212A Abrasion, left knee, initial encounter: Secondary | ICD-10-CM | POA: Insufficient documentation

## 2023-06-14 DIAGNOSIS — I959 Hypotension, unspecified: Secondary | ICD-10-CM | POA: Diagnosis not present

## 2023-06-14 DIAGNOSIS — R569 Unspecified convulsions: Secondary | ICD-10-CM | POA: Diagnosis not present

## 2023-06-14 DIAGNOSIS — E560 Deficiency of vitamin E: Secondary | ICD-10-CM | POA: Diagnosis not present

## 2023-06-14 DIAGNOSIS — Z743 Need for continuous supervision: Secondary | ICD-10-CM | POA: Diagnosis not present

## 2023-06-14 DIAGNOSIS — R9401 Abnormal electroencephalogram [EEG]: Secondary | ICD-10-CM | POA: Diagnosis not present

## 2023-06-14 DIAGNOSIS — Z01810 Encounter for preprocedural cardiovascular examination: Secondary | ICD-10-CM | POA: Diagnosis not present

## 2023-06-14 DIAGNOSIS — Z471 Aftercare following joint replacement surgery: Secondary | ICD-10-CM | POA: Diagnosis not present

## 2023-06-14 DIAGNOSIS — D496 Neoplasm of unspecified behavior of brain: Secondary | ICD-10-CM | POA: Diagnosis not present

## 2023-06-14 DIAGNOSIS — S79919A Unspecified injury of unspecified hip, initial encounter: Secondary | ICD-10-CM | POA: Diagnosis not present

## 2023-06-14 DIAGNOSIS — I6203 Nontraumatic chronic subdural hemorrhage: Secondary | ICD-10-CM | POA: Diagnosis not present

## 2023-06-14 DIAGNOSIS — Z79899 Other long term (current) drug therapy: Secondary | ICD-10-CM | POA: Diagnosis not present

## 2023-06-14 DIAGNOSIS — G40909 Epilepsy, unspecified, not intractable, without status epilepticus: Secondary | ICD-10-CM | POA: Diagnosis not present

## 2023-06-14 LAB — CBC WITH DIFFERENTIAL/PLATELET
Abs Immature Granulocytes: 0.18 10*3/uL — ABNORMAL HIGH (ref 0.00–0.07)
Basophils Absolute: 0.1 10*3/uL (ref 0.0–0.1)
Basophils Relative: 0 %
Eosinophils Absolute: 0 10*3/uL (ref 0.0–0.5)
Eosinophils Relative: 0 %
HCT: 38.9 % — ABNORMAL LOW (ref 39.0–52.0)
Hemoglobin: 13 g/dL (ref 13.0–17.0)
Immature Granulocytes: 1 %
Lymphocytes Relative: 4 %
Lymphs Abs: 0.8 10*3/uL (ref 0.7–4.0)
MCH: 31.2 pg (ref 26.0–34.0)
MCHC: 33.4 g/dL (ref 30.0–36.0)
MCV: 93.3 fL (ref 80.0–100.0)
Monocytes Absolute: 1.5 10*3/uL — ABNORMAL HIGH (ref 0.1–1.0)
Monocytes Relative: 8 %
Neutro Abs: 15.7 10*3/uL — ABNORMAL HIGH (ref 1.7–7.7)
Neutrophils Relative %: 87 %
Platelets: 274 10*3/uL (ref 150–400)
RBC: 4.17 MIL/uL — ABNORMAL LOW (ref 4.22–5.81)
RDW: 13.2 % (ref 11.5–15.5)
WBC: 18.2 10*3/uL — ABNORMAL HIGH (ref 4.0–10.5)
nRBC: 0 % (ref 0.0–0.2)

## 2023-06-14 LAB — BASIC METABOLIC PANEL
Anion gap: 11 (ref 5–15)
BUN: 15 mg/dL (ref 8–23)
CO2: 24 mmol/L (ref 22–32)
Calcium: 9 mg/dL (ref 8.9–10.3)
Chloride: 105 mmol/L (ref 98–111)
Creatinine, Ser: 1.08 mg/dL (ref 0.61–1.24)
GFR, Estimated: >60 mL/min (ref >60)
Glucose, Bld: 152 mg/dL — ABNORMAL HIGH (ref 70–99)
Potassium: 3.3 mmol/L — ABNORMAL LOW (ref 3.5–5.1)
Sodium: 140 mmol/L (ref 135–145)

## 2023-06-14 LAB — TYPE AND SCREEN
ABO/RH(D): B POS
Antibody Screen: NEGATIVE

## 2023-06-14 LAB — PROTIME-INR
INR: 1 (ref 0.8–1.2)
Prothrombin Time: 13.4 s (ref 11.4–15.2)

## 2023-06-14 MED ORDER — HYDROMORPHONE HCL 1 MG/ML IJ SOLN
1.0000 mg | Freq: Once | INTRAMUSCULAR | Status: AC
  Administered 2023-06-14: 1 mg via INTRAVENOUS
  Filled 2023-06-14: qty 1

## 2023-06-14 MED ORDER — HYDROMORPHONE HCL 1 MG/ML IJ SOLN
0.5000 mg | Freq: Once | INTRAMUSCULAR | Status: AC
  Administered 2023-06-14: 0.5 mg via INTRAVENOUS
  Filled 2023-06-14: qty 1

## 2023-06-14 MED ORDER — FENTANYL CITRATE PF 50 MCG/ML IJ SOSY
50.0000 ug | PREFILLED_SYRINGE | INTRAMUSCULAR | Status: DC | PRN
  Administered 2023-06-14: 50 ug via INTRAVENOUS
  Filled 2023-06-14: qty 1

## 2023-06-14 NOTE — ED Triage Notes (Signed)
 Pt to the ed from home with a CC of fall with hip pain. Pt relays he lost control of his walker. Pt did not hit his head. Pt relays he is on blood thinners. Pt has 100 mcg fentanyl en route to to hospital.

## 2023-06-14 NOTE — Telephone Encounter (Unsigned)
 Copied from CRM [id number]. Topic: General - Other >> Jun 14, 2023  2:16 PM Jon Gills C wrote: Reason for CRM: Patient Wife called in wanting to speak with Albin Felling, stated it is very important and she needs to speak with her as soon as possible

## 2023-06-14 NOTE — ED Notes (Signed)
 [facility] pal line called for patient transfer ct called for images to be pushed over

## 2023-06-14 NOTE — ED Provider Notes (Signed)
 [place] EMERGENCY DEPARTMENT AT [facility] Provider Note   CSN: [id number] Arrival date & time: 06/14/23  1626     History  Chief Complaint  Patient presents with   Hunter Chang    Hunter Chang is a 78 y.o. male.   Fall  Patient presents after fall.  Left hip pain.  Was walking with his walker and fell.  Only pain in left hip.  Has history of brain surgery and brain abscess.  Denies hitting his head.  No loss conscious.  Is on Eliquis.  No other injury.  States it hurts from the left hip down.     Home Medications Prior to Admission medications   Medication Sig Start Date End Date Taking? Authorizing Provider  apixaban (ELIQUIS) 5 MG TABS tablet Take 1 tablet (5 mg total) by mouth 2 (two) times daily. 05/26/23   Pincus Sanes, MD  Cholecalciferol 100 MCG (4000 UT) TABS Take by mouth.    [provider]  Cyanocobalamin 5000 MCG CAPS Take 1 capsule by mouth daily.    [provider]  hydrocortisone 2.5 % cream SMARTSIG:Sparingly Topical Twice Daily 02/24/22   [provider]  Lacosamide 100 MG TABS Take by mouth.    [provider]  latanoprost (XALATAN) 0.005 % ophthalmic solution Place 1 drop into both eyes at bedtime. 09/17/21   [provider]  levETIRAcetam (KEPPRA) 500 MG tablet Take 1 tablet (500 mg total) by mouth 2 (two) times daily. 10/11/21   Amin, Ankit C, MD  magnesium gluconate (MAGONATE) 500 MG tablet Take 500 mg by mouth daily.    [provider]  Multiple Vitamins-Minerals (ONE-A-DAY MENS 50+ ADVANTAGE) TABS Take 1 tablet by mouth daily.    [provider]  omeprazole (PRILOSEC) 20 MG capsule TAKE 1 CAPSULE (20 MG TOTAL) BY MOUTH DAILY. TAKE 30 MINUTES PRIOR TO A MEAL 02/01/23   Burns, Bobette Mo, MD  ondansetron (ZOFRAN) 8 MG tablet Take 8 mg by mouth 2 (two) times daily as needed. 04/10/23   [provider]  ondansetron (ZOFRAN-ODT) 4 MG disintegrating tablet Dissolve 1 tablet (4 mg total)  by mouth every 4 (four) hours as needed. 04/25/23   Melene Plan, DO  potassium chloride SA (KLOR-CON M) 20 MEQ tablet Take 1 tablet (20 mEq total) by mouth daily. 11/04/22   Pincus Sanes, MD  predniSONE (DELTASONE) 20 MG tablet Take 20 mg by mouth daily with breakfast.    [provider]  predniSONE (DELTASONE) 20 MG tablet TAKE 1 TABLET BY MOUTH DAILY WITH BREAKFAST 05/09/23   Pincus Sanes, MD  sertraline (ZOLOFT) 50 MG tablet Take 1 tablet (50 mg total) by mouth daily. 03/24/23   Corwin Levins, MD  sodium chloride irrigation 0.9 % irrigation Use for wet to dry scalp dressings BID 11/10/21   [provider]  tamsulosin (FLOMAX) 0.4 MG CAPS capsule Take 1 capsule (0.4 mg total) by mouth daily after supper. 04/25/23   Melene Plan, DO  VITAMIN D PO Take 1 tablet by mouth daily.    [provider]      Allergies    Cefepime, Pembrolizumab, Succinylcholine chloride, Valproic acid, and Chlorhexidine gluconate    Review of Systems   Review of Systems  Physical Exam Updated Vital Signs BP (!) 122/51   Pulse (!) 103   Temp 99.8 F (37.7 C) (Oral)   Resp 18   Ht 5\' 8"  (1.727 m)   Wt 68 kg  SpO2 96%   BMI 22.79 kg/m  Physical Exam Vitals and nursing note reviewed.  HENT:     Head:     Comments: Clean dressing to top of head. Cardiovascular:     Rate and Rhythm: Normal rate.  Pulmonary:     Breath sounds: No wheezing.  Abdominal:     Tenderness: There is no abdominal tenderness.  Musculoskeletal:        General: Tenderness present.     Cervical back: Neck supple. No tenderness.     Comments: Tenderness to left hip laterally.  Decreased range of motion with some external rotation.  Abrasion to left knee and left elbow without large laceration.  No underlying bony tenderness.  Neurological:     Mental Status: He is alert. Mental status is at baseline.     ED Results / Procedures / Treatments   Labs (all labs ordered are listed, but only abnormal results  are displayed) Labs Reviewed  BASIC METABOLIC PANEL - Abnormal; Notable for the following components:      Result Value   Potassium 3.3 (*)    Glucose, Bld 152 (*)    All other components within normal limits  CBC WITH DIFFERENTIAL/PLATELET - Abnormal; Notable for the following components:   WBC 18.2 (*)    RBC 4.17 (*)    HCT 38.9 (*)    Neutro Abs 15.7 (*)    Monocytes Absolute 1.5 (*)    Abs Immature Granulocytes 0.18 (*)    All other components within normal limits  PROTIME-INR  TYPE AND SCREEN    EKG None  Radiology DG Hip Unilat W or Wo Pelvis 2-3 Views Left Result Date: 06/14/2023 CLINICAL DATA:  Left hip pain after fall. EXAM: DG HIP (WITH OR WITHOUT PELVIS) 2-3V LEFT COMPARISON:  None Available. FINDINGS: Moderately displaced proximal left femoral neck fracture is noted. IMPRESSION: Moderately displaced proximal left femoral neck fracture. Electronically Signed   By: Lupita Raider M.D.   On: 06/14/2023 18:07   DG Chest Portable 1 View Result Date: 06/14/2023 CLINICAL DATA:  Fall. EXAM: PORTABLE CHEST 1 VIEW COMPARISON:  January 06, 2022. FINDINGS: The heart size and mediastinal contours are within normal limits. Both lungs are clear. The visualized skeletal structures are unremarkable. IMPRESSION: No active disease. Electronically Signed   By: Lupita Raider M.D.   On: 06/14/2023 18:06    Procedures Procedures    Medications Ordered in ED Medications  HYDROmorphone (DILAUDID) injection 0.5 mg (0.5 mg Intravenous Given 06/14/23 1828)  HYDROmorphone (DILAUDID) injection 1 mg (1 mg Intravenous Given 06/14/23 1932)    ED Course/ Medical Decision Making/ A&P                                 Medical Decision Making Amount and/or Complexity of Data Reviewed Labs: ordered. Radiology: ordered.  Risk Prescription drug management.   Patient with fall.  Left hip pain.  Differential diagnosis includes dislocation contusion and fracture.  Denied hitting head.  Is on  blood thinners.  Has complicated intracranial history with tumor and abscess.  Reviewed infectious disease note.  Pelvic x-ray independently interpreted and shows femoral neck fracture.  Agree with radiology read later.  Requiring repeated doses of pain medicine.  Discussed with Dr. Lorin Picket from orthopedic surgery at [facility].  He can handle the fracture but request admission to a different service.  Discussed with Drinda Butts at [facility].  States they have  a spot on the gerontology floor and she will contact them.  Discussed again with translator service.  Dr. Ludwig Clarks is accepting.  CRITICAL CARE Performed by: Benjiman Core Total critical care time: 30 minutes Critical care time was exclusive of separately billable procedures and treating other patients. Critical care was necessary to treat or prevent imminent or life-threatening deterioration. Critical care was time spent personally by me on the following activities: development of treatment plan with patient and/or surrogate as well as nursing, discussions with consultants, evaluation of patient's response to treatment, examination of patient, obtaining history from patient or surrogate, ordering and performing treatments and interventions, ordering and review of laboratory studies, ordering and review of radiographic studies, pulse oximetry and re-evaluation of patient's condition.              Final Clinical Impression(s) / ED Diagnoses Final diagnoses:  Closed fracture of left hip, initial encounter [facility])    Rx / DC Orders ED Discharge Orders     None         Benjiman Core, MD 06/14/23 2022

## 2023-06-14 NOTE — ED Notes (Signed)
 X-ray at bedside

## 2023-06-15 DIAGNOSIS — M25552 Pain in left hip: Secondary | ICD-10-CM | POA: Diagnosis not present

## 2023-06-15 DIAGNOSIS — S72002A Fracture of unspecified part of neck of left femur, initial encounter for closed fracture: Secondary | ICD-10-CM | POA: Diagnosis not present

## 2023-06-15 DIAGNOSIS — M25562 Pain in left knee: Secondary | ICD-10-CM | POA: Diagnosis not present

## 2023-06-15 DIAGNOSIS — S72092A Other fracture of head and neck of left femur, initial encounter for closed fracture: Secondary | ICD-10-CM | POA: Diagnosis not present

## 2023-06-15 NOTE — Telephone Encounter (Signed)
 Spoke with spouse today.  Patient currently admitted for breaking hip.

## 2023-06-16 DIAGNOSIS — Z01818 Encounter for other preprocedural examination: Secondary | ICD-10-CM | POA: Diagnosis not present

## 2023-06-16 DIAGNOSIS — S72002A Fracture of unspecified part of neck of left femur, initial encounter for closed fracture: Secondary | ICD-10-CM | POA: Diagnosis not present

## 2023-06-17 ENCOUNTER — Other Ambulatory Visit: Payer: Self-pay | Admitting: Internal Medicine

## 2023-06-21 DIAGNOSIS — S51019A Laceration without foreign body of unspecified elbow, initial encounter: Secondary | ICD-10-CM | POA: Diagnosis not present

## 2023-06-21 DIAGNOSIS — Z96642 Presence of left artificial hip joint: Secondary | ICD-10-CM | POA: Diagnosis not present

## 2023-06-21 DIAGNOSIS — F32A Depression, unspecified: Secondary | ICD-10-CM | POA: Diagnosis not present

## 2023-06-21 DIAGNOSIS — E119 Type 2 diabetes mellitus without complications: Secondary | ICD-10-CM | POA: Diagnosis not present

## 2023-06-21 DIAGNOSIS — R000 Tachycardia, unspecified: Secondary | ICD-10-CM | POA: Diagnosis not present

## 2023-06-21 DIAGNOSIS — R531 Weakness: Secondary | ICD-10-CM | POA: Diagnosis not present

## 2023-06-21 DIAGNOSIS — K5909 Other constipation: Secondary | ICD-10-CM | POA: Diagnosis not present

## 2023-06-21 DIAGNOSIS — Z89622 Acquired absence of left hip joint: Secondary | ICD-10-CM | POA: Diagnosis not present

## 2023-06-21 DIAGNOSIS — Z743 Need for continuous supervision: Secondary | ICD-10-CM | POA: Diagnosis not present

## 2023-06-21 DIAGNOSIS — I10 Essential (primary) hypertension: Secondary | ICD-10-CM | POA: Diagnosis not present

## 2023-06-21 DIAGNOSIS — K1230 Oral mucositis (ulcerative), unspecified: Secondary | ICD-10-CM | POA: Diagnosis not present

## 2023-06-21 DIAGNOSIS — F5101 Primary insomnia: Secondary | ICD-10-CM | POA: Diagnosis not present

## 2023-06-21 DIAGNOSIS — D485 Neoplasm of uncertain behavior of skin: Secondary | ICD-10-CM | POA: Diagnosis not present

## 2023-06-21 DIAGNOSIS — Z7901 Long term (current) use of anticoagulants: Secondary | ICD-10-CM | POA: Diagnosis not present

## 2023-06-21 DIAGNOSIS — I6203 Nontraumatic chronic subdural hemorrhage: Secondary | ICD-10-CM | POA: Diagnosis not present

## 2023-06-21 DIAGNOSIS — R059 Cough, unspecified: Secondary | ICD-10-CM | POA: Diagnosis not present

## 2023-06-21 DIAGNOSIS — S72002D Fracture of unspecified part of neck of left femur, subsequent encounter for closed fracture with routine healing: Secondary | ICD-10-CM | POA: Diagnosis not present

## 2023-06-21 DIAGNOSIS — G4709 Other insomnia: Secondary | ICD-10-CM | POA: Diagnosis not present

## 2023-06-21 DIAGNOSIS — E560 Deficiency of vitamin E: Secondary | ICD-10-CM | POA: Diagnosis not present

## 2023-06-21 DIAGNOSIS — R238 Other skin changes: Secondary | ICD-10-CM | POA: Diagnosis not present

## 2023-06-21 DIAGNOSIS — M25552 Pain in left hip: Secondary | ICD-10-CM | POA: Diagnosis not present

## 2023-06-21 DIAGNOSIS — E559 Vitamin D deficiency, unspecified: Secondary | ICD-10-CM | POA: Diagnosis not present

## 2023-06-21 DIAGNOSIS — M810 Age-related osteoporosis without current pathological fracture: Secondary | ICD-10-CM | POA: Diagnosis not present

## 2023-06-21 DIAGNOSIS — Z8781 Personal history of (healed) traumatic fracture: Secondary | ICD-10-CM | POA: Diagnosis not present

## 2023-06-21 DIAGNOSIS — Z4889 Encounter for other specified surgical aftercare: Secondary | ICD-10-CM | POA: Diagnosis not present

## 2023-06-21 DIAGNOSIS — R569 Unspecified convulsions: Secondary | ICD-10-CM | POA: Diagnosis not present

## 2023-06-21 DIAGNOSIS — D496 Neoplasm of unspecified behavior of brain: Secondary | ICD-10-CM | POA: Diagnosis not present

## 2023-06-21 DIAGNOSIS — D519 Vitamin B12 deficiency anemia, unspecified: Secondary | ICD-10-CM | POA: Diagnosis not present

## 2023-06-21 DIAGNOSIS — G509 Disorder of trigeminal nerve, unspecified: Secondary | ICD-10-CM | POA: Diagnosis not present

## 2023-06-21 DIAGNOSIS — G40909 Epilepsy, unspecified, not intractable, without status epilepticus: Secondary | ICD-10-CM | POA: Diagnosis not present

## 2023-06-21 DIAGNOSIS — D62 Acute posthemorrhagic anemia: Secondary | ICD-10-CM | POA: Diagnosis not present

## 2023-06-21 DIAGNOSIS — Z9181 History of falling: Secondary | ICD-10-CM | POA: Diagnosis not present

## 2023-06-21 DIAGNOSIS — Z471 Aftercare following joint replacement surgery: Secondary | ICD-10-CM | POA: Diagnosis not present

## 2023-06-23 DIAGNOSIS — E119 Type 2 diabetes mellitus without complications: Secondary | ICD-10-CM | POA: Diagnosis not present

## 2023-06-23 DIAGNOSIS — G40909 Epilepsy, unspecified, not intractable, without status epilepticus: Secondary | ICD-10-CM | POA: Diagnosis not present

## 2023-06-23 DIAGNOSIS — M810 Age-related osteoporosis without current pathological fracture: Secondary | ICD-10-CM | POA: Diagnosis not present

## 2023-06-23 DIAGNOSIS — D519 Vitamin B12 deficiency anemia, unspecified: Secondary | ICD-10-CM | POA: Diagnosis not present

## 2023-06-23 DIAGNOSIS — Z7901 Long term (current) use of anticoagulants: Secondary | ICD-10-CM | POA: Diagnosis not present

## 2023-06-23 DIAGNOSIS — S72002D Fracture of unspecified part of neck of left femur, subsequent encounter for closed fracture with routine healing: Secondary | ICD-10-CM | POA: Diagnosis not present

## 2023-06-23 DIAGNOSIS — E559 Vitamin D deficiency, unspecified: Secondary | ICD-10-CM | POA: Diagnosis not present

## 2023-06-23 DIAGNOSIS — I6203 Nontraumatic chronic subdural hemorrhage: Secondary | ICD-10-CM | POA: Diagnosis not present

## 2023-06-27 DIAGNOSIS — I10 Essential (primary) hypertension: Secondary | ICD-10-CM | POA: Diagnosis not present

## 2023-06-29 DIAGNOSIS — Z8781 Personal history of (healed) traumatic fracture: Secondary | ICD-10-CM | POA: Diagnosis not present

## 2023-06-29 DIAGNOSIS — Z4889 Encounter for other specified surgical aftercare: Secondary | ICD-10-CM | POA: Diagnosis not present

## 2023-07-05 DIAGNOSIS — F32A Depression, unspecified: Secondary | ICD-10-CM | POA: Diagnosis not present

## 2023-07-05 DIAGNOSIS — K5909 Other constipation: Secondary | ICD-10-CM | POA: Diagnosis not present

## 2023-07-05 DIAGNOSIS — F5101 Primary insomnia: Secondary | ICD-10-CM | POA: Diagnosis not present

## 2023-07-05 DIAGNOSIS — G40909 Epilepsy, unspecified, not intractable, without status epilepticus: Secondary | ICD-10-CM | POA: Diagnosis not present

## 2023-07-05 DIAGNOSIS — S72002D Fracture of unspecified part of neck of left femur, subsequent encounter for closed fracture with routine healing: Secondary | ICD-10-CM | POA: Diagnosis not present

## 2023-07-12 DIAGNOSIS — S51019A Laceration without foreign body of unspecified elbow, initial encounter: Secondary | ICD-10-CM | POA: Diagnosis not present

## 2023-07-12 DIAGNOSIS — R531 Weakness: Secondary | ICD-10-CM | POA: Diagnosis not present

## 2023-07-12 DIAGNOSIS — S72002D Fracture of unspecified part of neck of left femur, subsequent encounter for closed fracture with routine healing: Secondary | ICD-10-CM | POA: Diagnosis not present

## 2023-07-16 ENCOUNTER — Other Ambulatory Visit: Payer: Self-pay | Admitting: Internal Medicine

## 2023-07-17 ENCOUNTER — Telehealth: Payer: Self-pay

## 2023-07-17 DIAGNOSIS — Z789 Other specified health status: Secondary | ICD-10-CM

## 2023-07-17 DIAGNOSIS — G40909 Epilepsy, unspecified, not intractable, without status epilepticus: Secondary | ICD-10-CM

## 2023-07-17 DIAGNOSIS — R531 Weakness: Secondary | ICD-10-CM

## 2023-07-17 DIAGNOSIS — Z7409 Other reduced mobility: Secondary | ICD-10-CM

## 2023-07-17 NOTE — Telephone Encounter (Signed)
 Copied from CRM [id number]. Topic: General - Other >> Jul 17, 2023  9:59 AM Jethro Morrison wrote: Reason for CRM: PT IS REQUESTING A CALL FROM [facility] (DR BURNS) NURSE STATED SHE IS NEEDING A HOSPITAL BED FOR HER SPOUSE. [phone] MRS Sanor CONTACT INFORMATION.

## 2023-07-19 NOTE — Telephone Encounter (Signed)
 Spoke with patient's daughter today.  Referral orders were faxed to [facility] today.

## 2023-07-19 NOTE — Telephone Encounter (Signed)
Dme ordered

## 2023-07-19 NOTE — Telephone Encounter (Signed)
 I think this is already been taken care of

## 2023-07-19 NOTE — Telephone Encounter (Signed)
 Copied from CRM [id number]. Topic: Clinical - Medical Advice >> Jul 19, 2023 10:49 AM Jethro Morrison wrote: Reason for CRM: THE DAUGHTER KRISTEN CREWS [phone] IS REQUESTING A CALL FROM DR BURNS OR HER NURSE IN REGARDS TO THE HOSPITAL BED FOR HER DAD, SHE STATED IT IS AN EMERGENT SITUATION HER DAD IS STARTING TO GET A SORE ON HIS BOTTOM.

## 2023-07-19 NOTE — Addendum Note (Signed)
 Addended by: Colene Dauphin on: 07/19/2023 08:55 PM   Modules accepted: Orders

## 2023-07-20 NOTE — Telephone Encounter (Signed)
 Order faxed to [facility] today.  Waiting to hear back from [facility] as well regarding DME fax that we can sign off on for patient and fax back.

## 2023-07-24 DIAGNOSIS — M1712 Unilateral primary osteoarthritis, left knee: Secondary | ICD-10-CM | POA: Diagnosis not present

## 2023-07-24 DIAGNOSIS — Z993 Dependence on wheelchair: Secondary | ICD-10-CM | POA: Diagnosis not present

## 2023-07-24 DIAGNOSIS — Z9181 History of falling: Secondary | ICD-10-CM | POA: Diagnosis not present

## 2023-07-24 DIAGNOSIS — D496 Neoplasm of unspecified behavior of brain: Secondary | ICD-10-CM | POA: Diagnosis not present

## 2023-07-24 DIAGNOSIS — M1611 Unilateral primary osteoarthritis, right hip: Secondary | ICD-10-CM | POA: Diagnosis not present

## 2023-07-24 DIAGNOSIS — G40909 Epilepsy, unspecified, not intractable, without status epilepticus: Secondary | ICD-10-CM | POA: Diagnosis not present

## 2023-07-24 DIAGNOSIS — G8194 Hemiplegia, unspecified affecting left nondominant side: Secondary | ICD-10-CM | POA: Diagnosis not present

## 2023-07-24 DIAGNOSIS — S72002D Fracture of unspecified part of neck of left femur, subsequent encounter for closed fracture with routine healing: Secondary | ICD-10-CM | POA: Diagnosis not present

## 2023-07-24 DIAGNOSIS — E099 Drug or chemical induced diabetes mellitus without complications: Secondary | ICD-10-CM | POA: Diagnosis not present

## 2023-07-24 DIAGNOSIS — G062 Extradural and subdural abscess, unspecified: Secondary | ICD-10-CM | POA: Diagnosis not present

## 2023-07-24 DIAGNOSIS — G08 Intracranial and intraspinal phlebitis and thrombophlebitis: Secondary | ICD-10-CM | POA: Diagnosis not present

## 2023-07-25 DIAGNOSIS — G08 Intracranial and intraspinal phlebitis and thrombophlebitis: Secondary | ICD-10-CM | POA: Diagnosis not present

## 2023-07-25 DIAGNOSIS — Z79899 Other long term (current) drug therapy: Secondary | ICD-10-CM | POA: Diagnosis not present

## 2023-07-25 DIAGNOSIS — G40909 Epilepsy, unspecified, not intractable, without status epilepticus: Secondary | ICD-10-CM | POA: Diagnosis not present

## 2023-07-25 DIAGNOSIS — D496 Neoplasm of unspecified behavior of brain: Secondary | ICD-10-CM | POA: Diagnosis not present

## 2023-07-26 ENCOUNTER — Telehealth: Payer: Self-pay | Admitting: Internal Medicine

## 2023-07-26 DIAGNOSIS — D649 Anemia, unspecified: Secondary | ICD-10-CM | POA: Diagnosis not present

## 2023-07-26 DIAGNOSIS — Z8781 Personal history of (healed) traumatic fracture: Secondary | ICD-10-CM | POA: Diagnosis not present

## 2023-07-26 DIAGNOSIS — L98494 Non-pressure chronic ulcer of skin of other sites with necrosis of bone: Secondary | ICD-10-CM | POA: Diagnosis not present

## 2023-07-26 DIAGNOSIS — S0100XD Unspecified open wound of scalp, subsequent encounter: Secondary | ICD-10-CM | POA: Diagnosis not present

## 2023-07-26 NOTE — Telephone Encounter (Signed)
 He should be evaluated in person

## 2023-07-26 NOTE — Telephone Encounter (Signed)
 Copied from CRM [id number]. Topic: Clinical - Medical Advice >> Jul 26, 2023 10:05 AM Jethro Morrison wrote: Reason for CRM: IDA [id number] THE NURSE CALLED FROM [facility] STATED HE WAS COUGHING UP MUCUS, SHE HEARD A LITTLE WHEEZING WHEN SHE LISTENED TO HIM AND HIS HEART RATE WAS 109 (LOWEST) AND 116 (HIGHEST).

## 2023-07-26 NOTE — Telephone Encounter (Signed)
 We should consider palliative to help with home visit.    We can always do a VV

## 2023-07-27 NOTE — Telephone Encounter (Signed)
 Spoke with spouse today. Nurse was out from [facility] today and no more wheezing noted.  Pulse was 74 today so no virtual needed.

## 2023-08-01 ENCOUNTER — Encounter: Payer: Self-pay | Admitting: Internal Medicine

## 2023-08-01 ENCOUNTER — Other Ambulatory Visit: Payer: Self-pay | Admitting: Internal Medicine

## 2023-08-02 ENCOUNTER — Telehealth: Payer: Self-pay | Admitting: Internal Medicine

## 2023-08-02 ENCOUNTER — Other Ambulatory Visit: Payer: Self-pay | Admitting: Internal Medicine

## 2023-08-02 NOTE — Telephone Encounter (Signed)
 Please call his wife-joy regarding his prednisone  prescription.  Originally this was prescribed by neurosurgery I believe.  Does he still need this?  This is not something that I initiated, but somehow had been prescribing and if he does not need it we should get him off of it.

## 2023-08-03 ENCOUNTER — Telehealth: Payer: Self-pay | Admitting: Internal Medicine

## 2023-08-03 ENCOUNTER — Other Ambulatory Visit: Payer: Self-pay | Admitting: Internal Medicine

## 2023-08-03 MED ORDER — PREDNISONE 10 MG PO TABS: ORAL_TABLET | ORAL | 0 refills | Status: DC

## 2023-08-03 NOTE — Telephone Encounter (Signed)
 We need to taper him off slowly - he can not just stop it.  Start 15 mg daily for 2 weeks then 10 mg daily for one month.  Then we will decrease by 1 mg every month since he has been on this a long time.  New rx sent to pharmacy for the first 6 weeks - then she needs to request the next prescription which will be a different pill

## 2023-08-03 NOTE — Telephone Encounter (Signed)
Spoke with Joy today and info given.

## 2023-08-03 NOTE — Telephone Encounter (Signed)
 Copied from CRM [id number]. Topic: General - Other >> Aug 03, 2023  3:32 PM Hunter Chang wrote: Reason for CRM: Patients daughter Hunter Chang is requesting a phone call from [facility] [employer]. She stated that PT was there today and wasn't able to help him much since he is in the bed. She contacted his insurance and was advised that he may need more PT.She   would like to know if a new PT order could be sent to [facility] for more PT hours? Cell#: [phone]

## 2023-08-04 NOTE — Telephone Encounter (Signed)
 Spoke with patient's spouse today and told her we would not need to do new order for PT.  [facility] would just need to request additional PT sessions and send paperwork for Dr. Donnette Gal to sign and complete if they felt like additional services were needed.

## 2023-08-15 DIAGNOSIS — Z7952 Long term (current) use of systemic steroids: Secondary | ICD-10-CM | POA: Diagnosis not present

## 2023-08-15 DIAGNOSIS — C4A9 Merkel cell carcinoma, unspecified: Secondary | ICD-10-CM | POA: Diagnosis not present

## 2023-08-15 DIAGNOSIS — Z87442 Personal history of urinary calculi: Secondary | ICD-10-CM | POA: Diagnosis not present

## 2023-08-15 DIAGNOSIS — D496 Neoplasm of unspecified behavior of brain: Secondary | ICD-10-CM | POA: Diagnosis not present

## 2023-08-15 DIAGNOSIS — Z79899 Other long term (current) drug therapy: Secondary | ICD-10-CM | POA: Diagnosis not present

## 2023-08-15 DIAGNOSIS — Z9889 Other specified postprocedural states: Secondary | ICD-10-CM | POA: Diagnosis not present

## 2023-08-23 DIAGNOSIS — Z9181 History of falling: Secondary | ICD-10-CM | POA: Diagnosis not present

## 2023-08-23 DIAGNOSIS — Z7952 Long term (current) use of systemic steroids: Secondary | ICD-10-CM | POA: Diagnosis not present

## 2023-08-23 DIAGNOSIS — L98494 Non-pressure chronic ulcer of skin of other sites with necrosis of bone: Secondary | ICD-10-CM | POA: Diagnosis not present

## 2023-08-23 DIAGNOSIS — G8194 Hemiplegia, unspecified affecting left nondominant side: Secondary | ICD-10-CM | POA: Diagnosis not present

## 2023-08-23 DIAGNOSIS — G060 Intracranial abscess and granuloma: Secondary | ICD-10-CM | POA: Diagnosis not present

## 2023-08-23 DIAGNOSIS — M8668 Other chronic osteomyelitis, other site: Secondary | ICD-10-CM | POA: Diagnosis not present

## 2023-08-23 DIAGNOSIS — S72002D Fracture of unspecified part of neck of left femur, subsequent encounter for closed fracture with routine healing: Secondary | ICD-10-CM | POA: Diagnosis not present

## 2023-08-23 DIAGNOSIS — D496 Neoplasm of unspecified behavior of brain: Secondary | ICD-10-CM | POA: Diagnosis not present

## 2023-08-23 DIAGNOSIS — D4819 Other specified neoplasm of uncertain behavior of connective and other soft tissue: Secondary | ICD-10-CM | POA: Diagnosis not present

## 2023-08-27 ENCOUNTER — Other Ambulatory Visit: Payer: Self-pay | Admitting: Internal Medicine

## 2023-08-28 ENCOUNTER — Other Ambulatory Visit: Payer: Self-pay

## 2023-09-20 DIAGNOSIS — G936 Cerebral edema: Secondary | ICD-10-CM | POA: Diagnosis not present

## 2023-09-20 DIAGNOSIS — R000 Tachycardia, unspecified: Secondary | ICD-10-CM | POA: Diagnosis not present

## 2023-09-20 DIAGNOSIS — T8189XD Other complications of procedures, not elsewhere classified, subsequent encounter: Secondary | ICD-10-CM | POA: Diagnosis not present

## 2023-09-20 DIAGNOSIS — D485 Neoplasm of uncertain behavior of skin: Secondary | ICD-10-CM | POA: Diagnosis not present

## 2023-09-20 DIAGNOSIS — L598 Other specified disorders of the skin and subcutaneous tissue related to radiation: Secondary | ICD-10-CM | POA: Diagnosis not present

## 2023-09-20 DIAGNOSIS — R531 Weakness: Secondary | ICD-10-CM | POA: Diagnosis not present

## 2023-09-20 DIAGNOSIS — C4449 Other specified malignant neoplasm of skin of scalp and neck: Secondary | ICD-10-CM | POA: Diagnosis not present

## 2023-09-20 DIAGNOSIS — G459 Transient cerebral ischemic attack, unspecified: Secondary | ICD-10-CM | POA: Diagnosis not present

## 2023-09-20 DIAGNOSIS — S0100XD Unspecified open wound of scalp, subsequent encounter: Secondary | ICD-10-CM | POA: Diagnosis not present

## 2023-09-20 DIAGNOSIS — Z85828 Personal history of other malignant neoplasm of skin: Secondary | ICD-10-CM | POA: Diagnosis not present

## 2023-09-20 DIAGNOSIS — L98496 Non-pressure chronic ulcer of skin of other sites with bone involvement without evidence of necrosis: Secondary | ICD-10-CM | POA: Diagnosis not present

## 2023-09-20 DIAGNOSIS — Y842 Radiological procedure and radiotherapy as the cause of abnormal reaction of the patient, or of later complication, without mention of misadventure at the time of the procedure: Secondary | ICD-10-CM | POA: Diagnosis not present

## 2023-09-20 DIAGNOSIS — Z515 Encounter for palliative care: Secondary | ICD-10-CM | POA: Diagnosis not present

## 2023-09-21 DIAGNOSIS — G936 Cerebral edema: Secondary | ICD-10-CM | POA: Diagnosis not present

## 2023-09-21 DIAGNOSIS — D496 Neoplasm of unspecified behavior of brain: Secondary | ICD-10-CM | POA: Diagnosis not present

## 2023-09-21 DIAGNOSIS — R531 Weakness: Secondary | ICD-10-CM | POA: Diagnosis not present

## 2023-09-21 DIAGNOSIS — G40909 Epilepsy, unspecified, not intractable, without status epilepticus: Secondary | ICD-10-CM | POA: Diagnosis not present

## 2023-09-22 ENCOUNTER — Telehealth: Payer: Self-pay | Admitting: Internal Medicine

## 2023-09-22 DIAGNOSIS — L98494 Non-pressure chronic ulcer of skin of other sites with necrosis of bone: Secondary | ICD-10-CM | POA: Diagnosis not present

## 2023-09-22 NOTE — Telephone Encounter (Signed)
 Copied from CRM [id number]. Topic: General - Other >> Sep 22, 2023 10:46 AM Franky GRADE wrote: Reason for CRM: Marissa from [facility] home health is calling to verify if [facility] forms had been received from the office and turnaround time on when they will filled out and returned. Best call back number [phone].

## 2023-09-22 NOTE — Telephone Encounter (Signed)
 Form was completed yesterday and signed by Dr. Geofm today.  Form has been faxed and conformation received.

## 2023-09-23 ENCOUNTER — Other Ambulatory Visit: Payer: Self-pay | Admitting: Internal Medicine

## 2023-09-23 DIAGNOSIS — Z9181 History of falling: Secondary | ICD-10-CM | POA: Diagnosis not present

## 2023-09-23 DIAGNOSIS — G8194 Hemiplegia, unspecified affecting left nondominant side: Secondary | ICD-10-CM | POA: Diagnosis not present

## 2023-09-23 DIAGNOSIS — D496 Neoplasm of unspecified behavior of brain: Secondary | ICD-10-CM | POA: Diagnosis not present

## 2023-09-23 DIAGNOSIS — S72002D Fracture of unspecified part of neck of left femur, subsequent encounter for closed fracture with routine healing: Secondary | ICD-10-CM | POA: Diagnosis not present

## 2023-09-24 ENCOUNTER — Other Ambulatory Visit: Payer: Self-pay | Admitting: Internal Medicine

## 2023-09-25 ENCOUNTER — Telehealth: Payer: Self-pay

## 2023-09-25 DIAGNOSIS — E876 Hypokalemia: Secondary | ICD-10-CM | POA: Diagnosis not present

## 2023-09-25 DIAGNOSIS — S72002D Fracture of unspecified part of neck of left femur, subsequent encounter for closed fracture with routine healing: Secondary | ICD-10-CM | POA: Diagnosis not present

## 2023-09-25 DIAGNOSIS — N200 Calculus of kidney: Secondary | ICD-10-CM | POA: Diagnosis not present

## 2023-09-25 DIAGNOSIS — G8194 Hemiplegia, unspecified affecting left nondominant side: Secondary | ICD-10-CM | POA: Diagnosis not present

## 2023-09-25 DIAGNOSIS — G40909 Epilepsy, unspecified, not intractable, without status epilepticus: Secondary | ICD-10-CM | POA: Diagnosis not present

## 2023-09-25 DIAGNOSIS — G062 Extradural and subdural abscess, unspecified: Secondary | ICD-10-CM | POA: Diagnosis not present

## 2023-09-25 DIAGNOSIS — G08 Intracranial and intraspinal phlebitis and thrombophlebitis: Secondary | ICD-10-CM | POA: Diagnosis not present

## 2023-09-25 DIAGNOSIS — D485 Neoplasm of uncertain behavior of skin: Secondary | ICD-10-CM | POA: Diagnosis not present

## 2023-09-25 DIAGNOSIS — D496 Neoplasm of unspecified behavior of brain: Secondary | ICD-10-CM | POA: Diagnosis not present

## 2023-09-25 DIAGNOSIS — H409 Unspecified glaucoma: Secondary | ICD-10-CM | POA: Diagnosis not present

## 2023-09-25 DIAGNOSIS — N189 Chronic kidney disease, unspecified: Secondary | ICD-10-CM | POA: Diagnosis not present

## 2023-09-25 DIAGNOSIS — E099 Drug or chemical induced diabetes mellitus without complications: Secondary | ICD-10-CM | POA: Diagnosis not present

## 2023-09-25 NOTE — Telephone Encounter (Signed)
 Form faxed second time today and conformation received again.

## 2023-09-25 NOTE — Telephone Encounter (Unsigned)
 Copied from CRM [id number]. Topic: General - Other >> Sep 25, 2023  4:14 PM Berneda FALCON wrote: Reason for CRM: Daughter Josette is calling to check on the status of the FL2 form that Tobias is working on for her. Wants to be sure we know that the previous fax was sent to the wrong number.  The correct fax number is [phone] and they would like this done sooner than later if at all possible as the facility has a bed opening up this week and it has to be run by insurance first so in order for this to be approved, it has to be received by them to send to the facility.  Kristen's callback number is [phone]

## 2023-09-25 NOTE — Telephone Encounter (Signed)
 Copied from CRM [id number]. Topic: General - Other >> Sep 25, 2023  3:38 PM Thersia BROCKS wrote: [phone] attn: [place]  [place] 2 Form and Progress Notes    Patient daughter called in stated she would like for Tobias to call back to inform her that it has been sent  [phone]  ---  Told E2C2  agent that form had been faxed and confirmation received.  TDW

## 2023-09-25 NOTE — Telephone Encounter (Signed)
 Message left for Ana today that form had been faxed 2 times now with conformations both received.  Faxed form to [facility] with fax number provided and progress notes from last visit in March.  Will call daughter tomorrow to let her know forms were faxed!

## 2023-09-25 NOTE — Telephone Encounter (Signed)
 Copied from CRM [id number]. Topic: General - Other >> Sep 25, 2023 10:17 AM Drema MATSU wrote: Daved stated that she did not received the FL2 Forms and would like for it to be refax. Fax: [phone] ATTN: Daved

## 2023-09-26 ENCOUNTER — Telehealth: Payer: Self-pay | Admitting: Internal Medicine

## 2023-09-26 MED ORDER — PREDNISONE 5 MG PO TABS: 7.5000 mg | ORAL_TABLET | Freq: Every day | ORAL | 0 refills | Status: AC

## 2023-09-26 NOTE — Telephone Encounter (Signed)
 Please let Hunter Chang know we will continue to decrease the prednisone  dose and will taper him off-he will now start 7.5 mg daily for 1 month and then we will decrease it further from there

## 2023-09-26 NOTE — Telephone Encounter (Signed)
 No concerns - has been on prednisone  for years

## 2023-09-26 NOTE — Telephone Encounter (Signed)
Spoke with patient's spouse today 

## 2023-09-26 NOTE — Telephone Encounter (Signed)
 Unable to contact nurse back because number in message is incorrect and is missing a number.

## 2023-09-26 NOTE — Telephone Encounter (Signed)
 Copied from CRM [id number]. Topic: Clinical - Prescription Issue >> Sep 26, 2023 12:46 PM Thersia BROCKS wrote: Reason for CRM: Home Health Nurse Chauncey called in regarding patient prescription  recently received a medication that has a severity level 2 interaction  potassium chloride  SA (KLOR-CON  M) 20 MEQ tablet  Call back number is  [phone]

## 2023-09-26 NOTE — Telephone Encounter (Signed)
 Spoke with Hunter Chang today and we are all set.   [facility] called her and said they had everything they needed.

## 2023-09-28 ENCOUNTER — Encounter: Payer: Self-pay | Admitting: Internal Medicine

## 2023-10-02 ENCOUNTER — Telehealth: Payer: Self-pay | Admitting: Internal Medicine

## 2023-10-02 DIAGNOSIS — G509 Disorder of trigeminal nerve, unspecified: Secondary | ICD-10-CM | POA: Diagnosis not present

## 2023-10-02 DIAGNOSIS — I6203 Nontraumatic chronic subdural hemorrhage: Secondary | ICD-10-CM | POA: Diagnosis not present

## 2023-10-02 DIAGNOSIS — Z9889 Other specified postprocedural states: Secondary | ICD-10-CM | POA: Diagnosis not present

## 2023-10-02 DIAGNOSIS — Z923 Personal history of irradiation: Secondary | ICD-10-CM | POA: Diagnosis not present

## 2023-10-02 DIAGNOSIS — I6201 Nontraumatic acute subdural hemorrhage: Secondary | ICD-10-CM | POA: Diagnosis not present

## 2023-10-02 DIAGNOSIS — C4449 Other specified malignant neoplasm of skin of scalp and neck: Secondary | ICD-10-CM | POA: Diagnosis not present

## 2023-10-02 DIAGNOSIS — R930 Abnormal findings on diagnostic imaging of skull and head, not elsewhere classified: Secondary | ICD-10-CM | POA: Diagnosis not present

## 2023-10-02 DIAGNOSIS — R569 Unspecified convulsions: Secondary | ICD-10-CM | POA: Diagnosis not present

## 2023-10-02 DIAGNOSIS — D485 Neoplasm of uncertain behavior of skin: Secondary | ICD-10-CM | POA: Diagnosis not present

## 2023-10-02 DIAGNOSIS — Z9221 Personal history of antineoplastic chemotherapy: Secondary | ICD-10-CM | POA: Diagnosis not present

## 2023-10-02 DIAGNOSIS — C4490 Unspecified malignant neoplasm of skin, unspecified: Secondary | ICD-10-CM | POA: Diagnosis not present

## 2023-10-02 NOTE — Telephone Encounter (Signed)
 Copied from CRM [id number]. Topic: General - Other >> Oct 02, 2023  9:39 AM Gennette ORN wrote: Reason for CRM: Patient daughter Josette calling in patient rehab instead if what he is getting now. The patient got denied and she is working on the appeal but she just wants to make sure she has the correct notes from [place].  She is requesting a call from [place]. [phone].

## 2023-10-02 NOTE — Telephone Encounter (Signed)
Spoke with daughter today and questions answered.

## 2023-10-04 ENCOUNTER — Encounter: Payer: Self-pay | Admitting: Internal Medicine

## 2023-10-04 ENCOUNTER — Other Ambulatory Visit: Payer: Self-pay

## 2023-10-04 DIAGNOSIS — L98494 Non-pressure chronic ulcer of skin of other sites with necrosis of bone: Secondary | ICD-10-CM | POA: Diagnosis not present

## 2023-10-04 DIAGNOSIS — G060 Intracranial abscess and granuloma: Secondary | ICD-10-CM | POA: Diagnosis not present

## 2023-10-04 DIAGNOSIS — Z7952 Long term (current) use of systemic steroids: Secondary | ICD-10-CM | POA: Diagnosis not present

## 2023-10-04 DIAGNOSIS — M8668 Other chronic osteomyelitis, other site: Secondary | ICD-10-CM | POA: Diagnosis not present

## 2023-10-04 DIAGNOSIS — D4819 Other specified neoplasm of uncertain behavior of connective and other soft tissue: Secondary | ICD-10-CM | POA: Diagnosis not present

## 2023-10-04 MED ORDER — APIXABAN 5 MG PO TABS: 5.0000 mg | ORAL_TABLET | Freq: Two times a day (BID) | ORAL | 0 refills | Status: DC

## 2023-10-05 DIAGNOSIS — H409 Unspecified glaucoma: Secondary | ICD-10-CM | POA: Diagnosis not present

## 2023-10-05 DIAGNOSIS — L98494 Non-pressure chronic ulcer of skin of other sites with necrosis of bone: Secondary | ICD-10-CM | POA: Diagnosis not present

## 2023-10-05 DIAGNOSIS — E1165 Type 2 diabetes mellitus with hyperglycemia: Secondary | ICD-10-CM | POA: Diagnosis not present

## 2023-10-05 DIAGNOSIS — E1169 Type 2 diabetes mellitus with other specified complication: Secondary | ICD-10-CM | POA: Diagnosis not present

## 2023-10-05 DIAGNOSIS — M869 Osteomyelitis, unspecified: Secondary | ICD-10-CM | POA: Diagnosis not present

## 2023-10-05 DIAGNOSIS — Z9181 History of falling: Secondary | ICD-10-CM | POA: Diagnosis not present

## 2023-10-05 DIAGNOSIS — G08 Intracranial and intraspinal phlebitis and thrombophlebitis: Secondary | ICD-10-CM | POA: Diagnosis not present

## 2023-10-05 DIAGNOSIS — K219 Gastro-esophageal reflux disease without esophagitis: Secondary | ICD-10-CM | POA: Diagnosis not present

## 2023-10-05 DIAGNOSIS — R2689 Other abnormalities of gait and mobility: Secondary | ICD-10-CM | POA: Diagnosis not present

## 2023-10-05 DIAGNOSIS — G060 Intracranial abscess and granuloma: Secondary | ICD-10-CM | POA: Diagnosis not present

## 2023-10-05 DIAGNOSIS — Z743 Need for continuous supervision: Secondary | ICD-10-CM | POA: Diagnosis not present

## 2023-10-05 DIAGNOSIS — E119 Type 2 diabetes mellitus without complications: Secondary | ICD-10-CM | POA: Diagnosis not present

## 2023-10-05 DIAGNOSIS — R1310 Dysphagia, unspecified: Secondary | ICD-10-CM | POA: Diagnosis not present

## 2023-10-05 DIAGNOSIS — G40909 Epilepsy, unspecified, not intractable, without status epilepticus: Secondary | ICD-10-CM | POA: Diagnosis not present

## 2023-10-05 DIAGNOSIS — Z96642 Presence of left artificial hip joint: Secondary | ICD-10-CM | POA: Diagnosis not present

## 2023-10-05 DIAGNOSIS — G8194 Hemiplegia, unspecified affecting left nondominant side: Secondary | ICD-10-CM | POA: Diagnosis not present

## 2023-10-05 DIAGNOSIS — Z79899 Other long term (current) drug therapy: Secondary | ICD-10-CM | POA: Diagnosis not present

## 2023-10-05 DIAGNOSIS — R41840 Attention and concentration deficit: Secondary | ICD-10-CM | POA: Diagnosis not present

## 2023-10-05 DIAGNOSIS — R531 Weakness: Secondary | ICD-10-CM | POA: Diagnosis not present

## 2023-10-05 DIAGNOSIS — Z7901 Long term (current) use of anticoagulants: Secondary | ICD-10-CM | POA: Diagnosis not present

## 2023-10-05 DIAGNOSIS — I495 Sick sinus syndrome: Secondary | ICD-10-CM | POA: Diagnosis not present

## 2023-10-05 DIAGNOSIS — R41841 Cognitive communication deficit: Secondary | ICD-10-CM | POA: Diagnosis not present

## 2023-10-05 DIAGNOSIS — Z8661 Personal history of infections of the central nervous system: Secondary | ICD-10-CM | POA: Diagnosis not present

## 2023-10-05 DIAGNOSIS — Z8603 Personal history of neoplasm of uncertain behavior: Secondary | ICD-10-CM | POA: Diagnosis not present

## 2023-10-05 DIAGNOSIS — M8668 Other chronic osteomyelitis, other site: Secondary | ICD-10-CM | POA: Diagnosis not present

## 2023-10-05 DIAGNOSIS — M6281 Muscle weakness (generalized): Secondary | ICD-10-CM | POA: Diagnosis not present

## 2023-10-05 DIAGNOSIS — Z452 Encounter for adjustment and management of vascular access device: Secondary | ICD-10-CM | POA: Diagnosis not present

## 2023-10-12 ENCOUNTER — Telehealth: Payer: Self-pay

## 2023-10-12 NOTE — Telephone Encounter (Signed)
 Copied from CRM [id number]. Topic: General - Other >> Oct 12, 2023  3:39 PM Chiquita SQUIBB wrote: Reason for CRM: Patients daughter Josette is calling in because they met with the estate people today and they need a letter from the PCP stating the Gerlene is competent. They stated the letter has to have a letter heading and be signed by the doctor. They have their next meeting on Monday so they are requesting this be faxed as soon as possible. The fax number is [phone] attention Eva Riddles. Josette would like a call back when this has been sent over at [phone]. They are also requesting this to be urgent.

## 2023-10-13 ENCOUNTER — Other Ambulatory Visit: Payer: Self-pay | Admitting: Internal Medicine

## 2023-10-13 DIAGNOSIS — I739 Peripheral vascular disease, unspecified: Secondary | ICD-10-CM | POA: Diagnosis not present

## 2023-10-13 DIAGNOSIS — Z743 Need for continuous supervision: Secondary | ICD-10-CM | POA: Diagnosis not present

## 2023-10-13 DIAGNOSIS — Z7952 Long term (current) use of systemic steroids: Secondary | ICD-10-CM | POA: Diagnosis not present

## 2023-10-13 DIAGNOSIS — R931 Abnormal findings on diagnostic imaging of heart and coronary circulation: Secondary | ICD-10-CM | POA: Diagnosis not present

## 2023-10-13 DIAGNOSIS — Z712 Person consulting for explanation of examination or test findings: Secondary | ICD-10-CM | POA: Diagnosis not present

## 2023-10-13 DIAGNOSIS — G060 Intracranial abscess and granuloma: Secondary | ICD-10-CM | POA: Diagnosis not present

## 2023-10-13 DIAGNOSIS — D4819 Other specified neoplasm of uncertain behavior of connective and other soft tissue: Secondary | ICD-10-CM | POA: Diagnosis not present

## 2023-10-13 DIAGNOSIS — Z0000 Encounter for general adult medical examination without abnormal findings: Secondary | ICD-10-CM | POA: Diagnosis not present

## 2023-10-13 DIAGNOSIS — R1310 Dysphagia, unspecified: Secondary | ICD-10-CM | POA: Diagnosis not present

## 2023-10-13 DIAGNOSIS — C4A9 Merkel cell carcinoma, unspecified: Secondary | ICD-10-CM | POA: Diagnosis not present

## 2023-10-13 DIAGNOSIS — L600 Ingrowing nail: Secondary | ICD-10-CM | POA: Diagnosis not present

## 2023-10-13 DIAGNOSIS — L03811 Cellulitis of head [any part, except face]: Secondary | ICD-10-CM | POA: Diagnosis not present

## 2023-10-13 DIAGNOSIS — M8668 Other chronic osteomyelitis, other site: Secondary | ICD-10-CM | POA: Diagnosis not present

## 2023-10-13 DIAGNOSIS — H409 Unspecified glaucoma: Secondary | ICD-10-CM | POA: Diagnosis not present

## 2023-10-13 DIAGNOSIS — Z9181 History of falling: Secondary | ICD-10-CM | POA: Diagnosis not present

## 2023-10-13 DIAGNOSIS — R251 Tremor, unspecified: Secondary | ICD-10-CM | POA: Diagnosis not present

## 2023-10-13 DIAGNOSIS — G40909 Epilepsy, unspecified, not intractable, without status epilepticus: Secondary | ICD-10-CM | POA: Diagnosis not present

## 2023-10-13 DIAGNOSIS — E119 Type 2 diabetes mellitus without complications: Secondary | ICD-10-CM | POA: Diagnosis not present

## 2023-10-13 DIAGNOSIS — L603 Nail dystrophy: Secondary | ICD-10-CM | POA: Diagnosis not present

## 2023-10-13 DIAGNOSIS — K219 Gastro-esophageal reflux disease without esophagitis: Secondary | ICD-10-CM | POA: Diagnosis not present

## 2023-10-13 DIAGNOSIS — R41841 Cognitive communication deficit: Secondary | ICD-10-CM | POA: Diagnosis not present

## 2023-10-13 DIAGNOSIS — Z792 Long term (current) use of antibiotics: Secondary | ICD-10-CM | POA: Diagnosis not present

## 2023-10-13 DIAGNOSIS — Z8661 Personal history of infections of the central nervous system: Secondary | ICD-10-CM | POA: Diagnosis not present

## 2023-10-13 DIAGNOSIS — B351 Tinea unguium: Secondary | ICD-10-CM | POA: Diagnosis not present

## 2023-10-13 DIAGNOSIS — M6281 Muscle weakness (generalized): Secondary | ICD-10-CM | POA: Diagnosis not present

## 2023-10-13 DIAGNOSIS — D496 Neoplasm of unspecified behavior of brain: Secondary | ICD-10-CM | POA: Diagnosis not present

## 2023-10-13 DIAGNOSIS — E1169 Type 2 diabetes mellitus with other specified complication: Secondary | ICD-10-CM | POA: Diagnosis not present

## 2023-10-13 DIAGNOSIS — Z79899 Other long term (current) drug therapy: Secondary | ICD-10-CM | POA: Diagnosis not present

## 2023-10-13 DIAGNOSIS — S72002D Fracture of unspecified part of neck of left femur, subsequent encounter for closed fracture with routine healing: Secondary | ICD-10-CM | POA: Diagnosis not present

## 2023-10-13 DIAGNOSIS — G08 Intracranial and intraspinal phlebitis and thrombophlebitis: Secondary | ICD-10-CM | POA: Diagnosis not present

## 2023-10-13 DIAGNOSIS — R000 Tachycardia, unspecified: Secondary | ICD-10-CM | POA: Diagnosis not present

## 2023-10-13 DIAGNOSIS — R41840 Attention and concentration deficit: Secondary | ICD-10-CM | POA: Diagnosis not present

## 2023-10-13 DIAGNOSIS — R2689 Other abnormalities of gait and mobility: Secondary | ICD-10-CM | POA: Diagnosis not present

## 2023-10-13 DIAGNOSIS — L84 Corns and callosities: Secondary | ICD-10-CM | POA: Diagnosis not present

## 2023-10-13 DIAGNOSIS — M869 Osteomyelitis, unspecified: Secondary | ICD-10-CM | POA: Diagnosis not present

## 2023-10-13 DIAGNOSIS — R531 Weakness: Secondary | ICD-10-CM | POA: Diagnosis not present

## 2023-10-13 DIAGNOSIS — Z452 Encounter for adjustment and management of vascular access device: Secondary | ICD-10-CM | POA: Diagnosis not present

## 2023-10-13 DIAGNOSIS — Z7901 Long term (current) use of anticoagulants: Secondary | ICD-10-CM | POA: Diagnosis not present

## 2023-10-13 DIAGNOSIS — G8194 Hemiplegia, unspecified affecting left nondominant side: Secondary | ICD-10-CM | POA: Diagnosis not present

## 2023-10-13 DIAGNOSIS — R9431 Abnormal electrocardiogram [ECG] [EKG]: Secondary | ICD-10-CM | POA: Diagnosis not present

## 2023-10-13 DIAGNOSIS — Z96642 Presence of left artificial hip joint: Secondary | ICD-10-CM | POA: Diagnosis not present

## 2023-10-15 NOTE — Telephone Encounter (Signed)
 Letter printed.

## 2023-10-16 NOTE — Telephone Encounter (Signed)
 Spoke with daughter today.   Letter faxed this morning with fax conformation received.

## 2023-10-17 DIAGNOSIS — M869 Osteomyelitis, unspecified: Secondary | ICD-10-CM | POA: Diagnosis not present

## 2023-10-17 DIAGNOSIS — L03811 Cellulitis of head [any part, except face]: Secondary | ICD-10-CM | POA: Diagnosis not present

## 2023-10-17 DIAGNOSIS — E1169 Type 2 diabetes mellitus with other specified complication: Secondary | ICD-10-CM | POA: Diagnosis not present

## 2023-10-17 DIAGNOSIS — G060 Intracranial abscess and granuloma: Secondary | ICD-10-CM | POA: Diagnosis not present

## 2023-10-23 DIAGNOSIS — D496 Neoplasm of unspecified behavior of brain: Secondary | ICD-10-CM | POA: Diagnosis not present

## 2023-10-23 DIAGNOSIS — G8194 Hemiplegia, unspecified affecting left nondominant side: Secondary | ICD-10-CM | POA: Diagnosis not present

## 2023-10-23 DIAGNOSIS — Z9181 History of falling: Secondary | ICD-10-CM | POA: Diagnosis not present

## 2023-10-23 DIAGNOSIS — S72002D Fracture of unspecified part of neck of left femur, subsequent encounter for closed fracture with routine healing: Secondary | ICD-10-CM | POA: Diagnosis not present

## 2023-10-24 DIAGNOSIS — L84 Corns and callosities: Secondary | ICD-10-CM | POA: Diagnosis not present

## 2023-10-24 DIAGNOSIS — L603 Nail dystrophy: Secondary | ICD-10-CM | POA: Diagnosis not present

## 2023-10-24 DIAGNOSIS — L600 Ingrowing nail: Secondary | ICD-10-CM | POA: Diagnosis not present

## 2023-10-24 DIAGNOSIS — I739 Peripheral vascular disease, unspecified: Secondary | ICD-10-CM | POA: Diagnosis not present

## 2023-10-24 DIAGNOSIS — B351 Tinea unguium: Secondary | ICD-10-CM | POA: Diagnosis not present

## 2023-11-01 DIAGNOSIS — T7840XA Allergy, unspecified, initial encounter: Secondary | ICD-10-CM | POA: Diagnosis not present

## 2023-11-01 DIAGNOSIS — G060 Intracranial abscess and granuloma: Secondary | ICD-10-CM | POA: Diagnosis not present

## 2023-11-01 DIAGNOSIS — M869 Osteomyelitis, unspecified: Secondary | ICD-10-CM | POA: Diagnosis not present

## 2023-11-01 DIAGNOSIS — Z7952 Long term (current) use of systemic steroids: Secondary | ICD-10-CM | POA: Diagnosis not present

## 2023-11-01 DIAGNOSIS — D4819 Other specified neoplasm of uncertain behavior of connective and other soft tissue: Secondary | ICD-10-CM | POA: Diagnosis not present

## 2023-11-01 DIAGNOSIS — Z09 Encounter for follow-up examination after completed treatment for conditions other than malignant neoplasm: Secondary | ICD-10-CM | POA: Diagnosis not present

## 2023-11-01 DIAGNOSIS — M8668 Other chronic osteomyelitis, other site: Secondary | ICD-10-CM | POA: Diagnosis not present

## 2023-11-01 DIAGNOSIS — L239 Allergic contact dermatitis, unspecified cause: Secondary | ICD-10-CM | POA: Diagnosis not present

## 2023-11-01 DIAGNOSIS — Z792 Long term (current) use of antibiotics: Secondary | ICD-10-CM | POA: Diagnosis not present

## 2023-11-01 NOTE — Progress Notes (Signed)
 Cardiology Office Note:  .   Date:  11/02/2023  ID:  Hunter Chang, DOB 07-Jan-1946, MRN [id number] PCP: Geofm Glade PARAS, MD  [facility] Providers Cardiologist:  None {  History of Present Illness: .   Hunter Chang is a 78 y.o. male with PMH type II diabetes, chronic scalp osteomyelitis. He was referred for evaluated of abnormal ECG and abnormal echo by Dr. Geofm.  Here with his wife; I cared for wife's father previously.  Today: Reviewed note from Dr. Geofm from 06/12/23. Noted prior echo at [facility] in 2023 that was abnormal. ECG from 3/19 was sinus with borderline left axis deviation.  Repeat echo 06/16/23 at [facility] was poor quality, normal LV size/thickness, EF 55-60%, RV normal, no significant valve disease by dopplers. I cannot see ECG from 10/09/23 at [facility] but read as normal sinus rhythm.  Was recently discharged from [facility] 10/13/23 with osteomyelitis. Has history of brain abscess. Still on IV antibiotics, currently at SNF. History of venous sinus thrombosis, on apixaban .  He is on vimpat and keppra  for seizures. Has not had seizures on this regimen.  No specific cardiac concerns today. Want to make sure his heart is ok. Is on long term steroids, no issues with fluid retention. Has noted heart rates have been in the 110s--confirmed this is sinus tach, discussed that this is often a response to body triggers/stressors. Wife notes that he had one episode where his blood pressure spiked to 180, received one time dose of lopressor . Does not think he was doing activity around that time.  ROS: Denies chest pain, shortness of breath at rest or with normal exertion. No PND, orthopnea, LE edema or unexpected weight gain. No syncope or palpitations. ROS otherwise negative except as noted.   Studies Reviewed: SABRA    EKG:  EKG Interpretation Date/Time:  Thursday November 02 2023 14:08:58 EDT Ventricular Rate:  116 PR Interval:  140 QRS Duration:  80 QT Interval:  316 QTC Calculation: 439 R  Axis:   -49  Text Interpretation: Sinus tachycardia Left axis deviation Artifact Confirmed by Lonni Slain [phone]) on 11/02/2023 2:15:31 PM    Physical Exam:   VS:  BP 104/72   Pulse (!) 115   Ht 5' 8 (1.727 m)   Wt 146 lb (66.2 kg)   SpO2 97%   BMI 22.20 kg/m    Wt Readings from Last 3 Encounters:  11/02/23 146 lb (66.2 kg)  06/14/23 149 lb 14.6 oz (68 kg)  04/25/23 152 lb (68.9 kg)    GEN: frail appearing gentleman in wheelchair HEENT:  Scalp lesion covered with gauze NECK: No JVD CARDIAC: regular rhythm, normal S1 and S2, no rubs or gallops. No murmur. VASCULAR: Radial and DP pulses 2+ bilaterally. No carotid bruits RESPIRATORY:  Clear to auscultation without rales, wheezing or rhonchi  ABDOMEN: Soft, non-tender, non-distended MUSCULOSKELETAL:  in wheelchair, moves all 4 limbs independently SKIN: Warm and dry, no edema NEUROLOGIC:  Alert and oriented x 3. No focal neuro deficits noted. PSYCHIATRIC:  Normal affect    ASSESSMENT AND PLAN: .    High risk medication use -on vimpat long term for seizures. This carries risk of arrhythmia or AV block. Denies palpitations, ECG unremarkable today -monitor annually with ECG or if symptomatic, get monitor  Abnormal ECG, abnormal echo -on previous testing. 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

## 2023-11-02 ENCOUNTER — Encounter (HOSPITAL_BASED_OUTPATIENT_CLINIC_OR_DEPARTMENT_OTHER): Payer: Self-pay | Admitting: Cardiology

## 2023-11-02 ENCOUNTER — Ambulatory Visit (HOSPITAL_BASED_OUTPATIENT_CLINIC_OR_DEPARTMENT_OTHER): Admitting: Cardiology

## 2023-11-02 VITALS — BP 104/72 | HR 115 | Ht 68.0 in | Wt 146.0 lb

## 2023-11-02 DIAGNOSIS — Z79899 Other long term (current) drug therapy: Secondary | ICD-10-CM | POA: Diagnosis not present

## 2023-11-02 DIAGNOSIS — R931 Abnormal findings on diagnostic imaging of heart and coronary circulation: Secondary | ICD-10-CM | POA: Diagnosis not present

## 2023-11-02 DIAGNOSIS — R9431 Abnormal electrocardiogram [ECG] [EKG]: Secondary | ICD-10-CM | POA: Diagnosis not present

## 2023-11-02 DIAGNOSIS — Z712 Person consulting for explanation of examination or test findings: Secondary | ICD-10-CM | POA: Diagnosis not present

## 2023-11-02 NOTE — Patient Instructions (Signed)
 Medication Instructions:  No medication changes were made at this visit. Continue current regimen.   *If you need a refill on your cardiac medications before your next appointment, please call your pharmacy*  Lab Work: None ordered today. If you have labs (blood work) drawn today and your tests are completely normal, you will receive your results only by: MyChart Message (if you have MyChart) OR A paper copy in the mail If you have any lab test that is abnormal or we need to change your treatment, we will call you to review the results.  Testing/Procedures: None ordered today.  Follow-Up: At [facility], you and your health needs are our priority.  As part of our continuing mission to provide you with exceptional heart care, our providers are all part of one team.  This team includes your primary Cardiologist (physician) and Advanced Practice Providers or APPs (Physician Assistants and Nurse Practitioners) who all work together to provide you with the care you need, when you need it.  Your next appointment:   1 year(s)  Provider:   Shelda Bruckner, MD

## 2023-11-06 DIAGNOSIS — L258 Unspecified contact dermatitis due to other agents: Secondary | ICD-10-CM | POA: Diagnosis not present

## 2023-11-06 DIAGNOSIS — R197 Diarrhea, unspecified: Secondary | ICD-10-CM | POA: Diagnosis not present

## 2023-11-08 DIAGNOSIS — R350 Frequency of micturition: Secondary | ICD-10-CM | POA: Diagnosis not present

## 2023-11-08 DIAGNOSIS — C4A9 Merkel cell carcinoma, unspecified: Secondary | ICD-10-CM | POA: Diagnosis not present

## 2023-11-08 DIAGNOSIS — R8290 Unspecified abnormal findings in urine: Secondary | ICD-10-CM | POA: Diagnosis not present

## 2023-11-09 DIAGNOSIS — B3749 Other urogenital candidiasis: Secondary | ICD-10-CM | POA: Diagnosis not present

## 2023-11-16 ENCOUNTER — Ambulatory Visit (INDEPENDENT_AMBULATORY_CARE_PROVIDER_SITE_OTHER): Payer: PPO

## 2023-11-16 VITALS — Ht 68.0 in | Wt 146.0 lb

## 2023-11-16 DIAGNOSIS — Z0000 Encounter for general adult medical examination without abnormal findings: Secondary | ICD-10-CM

## 2023-11-16 NOTE — Progress Notes (Signed)
 Subjective:   Hunter Chang is a 77 y.o. who presents for a Medicare Wellness preventive visit.  As a reminder, Annual Wellness Visits don't include a physical exam, and some assessments may be limited, especially if this visit is performed virtually. We may recommend an in-person follow-up visit with your provider if needed.  Visit Complete: Virtual I connected with  Hunter Chang on 11/16/23 by a audio enabled telemedicine application and verified that I am speaking with the correct person using two identifiers.  Patient Location: Home  Provider Location: Office/Clinic  I discussed the limitations of evaluation and management by telemedicine. The patient expressed understanding and agreed to proceed.  Vital Signs: Because this visit was a virtual/telehealth visit, some criteria may be missing or patient reported. Any vitals not documented were not able to be obtained and vitals that have been documented are patient reported.  VideoDeclined- This patient declined Librarian, academic. Therefore the visit was completed with audio only.  Persons Participating in Visit: Patient assisted by wife (Joy)_.  NO CHANGES FOR PHQ-9 from 11/15/22-per pt's wife   AWV Questionnaire: No: Patient Medicare AWV questionnaire was not completed prior to this visit.  Cardiac Risk Factors include: advanced age (>86men, >21 women);diabetes mellitus     Objective:    Today's Vitals   11/16/23 1109  Weight: 146 lb (66.2 kg)  Height: 5' 8 (1.727 m)   Body mass index is 22.2 kg/m.     11/16/2023   11:36 AM 11/15/2022   11:57 AM 01/24/2022    9:33 AM 11/30/2021   10:59 PM 10/21/2021   11:58 AM 10/08/2021    3:00 PM 09/26/2021    9:21 AM  Advanced Directives  Does Patient Have a Medical Advance Directive? Yes No Yes No Yes Yes Yes  Type of Estate agent of [place];Living will  Living will  Living will Living will   Does patient want to make changes  to medical advance directive?   No - Patient declined   No - Patient declined No - Patient declined  Copy of Healthcare Power of Attorney in Chart? No - copy requested        Would patient like information on creating a medical advance directive?  No - Patient declined         Current Medications (verified) Outpatient Encounter Medications as of 11/16/2023  Medication Sig   apixaban  (ELIQUIS ) 5 MG TABS tablet Take 1 tablet (5 mg total) by mouth 2 (two) times daily.   Cholecalciferol 100 MCG (4000 UT) TABS Take by mouth.   Cyanocobalamin  5000 MCG CAPS Take 1 capsule by mouth daily.   hydrocortisone 2.5 % cream SMARTSIG:Sparingly Topical Twice Daily   lacosamide (VIMPAT) 200 MG TABS tablet Take 1 tablet by mouth in the morning and at bedtime.   latanoprost (XALATAN) 0.005 % ophthalmic solution Place 1 drop into both eyes at bedtime.   levETIRAcetam  (KEPPRA ) 500 MG tablet Take 1 tablet (500 mg total) by mouth 2 (two) times daily.   magnesium gluconate (MAGONATE) 500 MG tablet Take 500 mg by mouth daily.   Multiple Vitamins-Minerals (ONE-A-DAY MENS 50+ ADVANTAGE) TABS Take 1 tablet by mouth daily.   omeprazole  (PRILOSEC) 20 MG capsule TAKE 1 CAPSULE (20 MG TOTAL) BY MOUTH DAILY. TAKE 30 MINUTES PRIOR TO A MEAL   ondansetron  (ZOFRAN ) 8 MG tablet Take 8 mg by mouth 2 (two) times daily as needed.   ondansetron  (ZOFRAN -ODT) 4 MG disintegrating tablet Dissolve 1 tablet (  4 mg total) by mouth every 4 (four) hours as needed.   potassium chloride  SA (KLOR-CON  M) 20 MEQ tablet TAKE 1 TABLET BY MOUTH EVERY DAY   predniSONE  (DELTASONE ) 5 MG tablet Take 1.5 tablets (7.5 mg total) by mouth daily with breakfast. (Patient taking differently: Take 10 mg by mouth 2 (two) times daily with a meal.)   sertraline  (ZOLOFT ) 50 MG tablet TAKE 1 TABLET BY MOUTH EVERY DAY   sodium chloride  irrigation 0.9 % irrigation Use for wet to dry scalp dressings BID   tamsulosin  (FLOMAX ) 0.4 MG CAPS capsule Take 1 capsule (0.4 mg  total) by mouth daily after supper.   VITAMIN D  PO Take 1 tablet by mouth daily.   No facility-administered encounter medications on file as of 11/16/2023.    Allergies (verified) Cefepime, Pembrolizumab, Succinylcholine chloride, Valproic acid, and Chlorhexidine gluconate   History: Past Medical History:  Diagnosis Date   Arthritis    Brain cancer (HCC)    Chicken pox    [place] measles    Trigeminal neuralgia    Past Surgical History:  Procedure Laterality Date   HERNIA REPAIR     3 times   Family History  Problem Relation Age of Onset   Arthritis Mother    Diabetes Father    Early death Father    Heart disease Father    Heart attack Father    Early death Maternal Grandfather    COPD Paternal Grandfather    Social History   Socioeconomic History   Marital status: Married    Spouse name: Joy   Number of children: Not on file   Years of education: Not on file   Highest education level: Not on file  Occupational History   Occupation: RETIRED  Tobacco Use   Smoking status: Never   Smokeless tobacco: Never  Vaping Use   Vaping status: Never Used  Substance and Sexual Activity   Alcohol use: No   Drug use: No   Sexual activity: Yes  Other Topics Concern   Not on file  Social History Narrative   Lives with wife/2025   Social Drivers of Health   Financial Resource Strain: Low Risk  (11/16/2023)   Overall Financial Resource Strain (CARDIA)    Difficulty of Paying Living Expenses: Not very hard  Food Insecurity: No Food Insecurity (11/16/2023)   Hunger Vital Sign    Worried About Running Out of Food in the Last Year: Never true    Ran Out of Food in the Last Year: Never true  Transportation Needs: No Transportation Needs (11/16/2023)   PRAPARE - Administrator, Civil Service (Medical): No    Lack of Transportation (Non-Medical): No  Physical Activity: Inactive (11/16/2023)   Exercise Vital Sign    Days of Exercise per Week: 0 days    Minutes of  Exercise per Session: 0 min  Stress: No Stress Concern Present (11/15/2022)   [employer] of Occupational Health - Occupational Stress Questionnaire    Feeling of Stress : Not at all  Social Connections: Moderately Isolated (11/16/2023)   Social Connection and Isolation Panel    Frequency of Communication with Friends and Family: More than three times a week    Frequency of Social Gatherings with Friends and Family: Twice a week    Attends Religious Services: Never    Database administrator or Organizations: No    Attends Banker Meetings: Never    Marital Status: Married    Tobacco  Counseling Counseling given: Not Answered    Clinical Intake:  Pre-visit preparation completed: Yes  Pain : No/denies pain     BMI - recorded: 22.2 Nutritional Status: BMI of 19-24  Normal Nutritional Risks: None Diabetes: Yes CBG done?: No Did pt. bring in CBG monitor from home?: No  Lab Results  Component Value Date   HGBA1C 6.9 (H) 05/03/2023     How often do you need to have someone help you when you read instructions, pamphlets, or other written materials from your doctor or pharmacy?: 4 - Often  Interpreter Needed?: No  Comments: Patient's wife help him with all things, Ambulance person Information entered by :: Trinna Broach, RMA   Activities of Daily Living     11/16/2023   11:10 AM  In your present state of health, do you have any difficulty performing the following activities:  Hearing? 0  Vision? 0  Difficulty concentrating or making decisions? 0  Walking or climbing stairs? 1  Dressing or bathing? 1  Doing errands, shopping? 0  Comment wife drives him around  Quarry manager and eating ? Y  Comment wife helps prepare  Using the Toilet? N  In the past six months, have you accidently leaked urine? Y  Comment wears a depend  Do you have problems with loss of bowel control? N  Managing your Medications? Y  Managing your Finances? N  Housekeeping or  managing your Housekeeping? Y    Patient Care Team: Geofm Glade PARAS, MD as PCP - General (Internal Medicine) Lonni Slain, MD as PCP - Cardiology (Cardiology) Dermatology, [facility], Delon CROME, RN as Oncology Nurse Navigator Izell Domino, MD as Consulting Physician (Radiation Oncology)  I have updated your Care Teams any recent Medical Services you may have received from other providers in the past year.     Assessment:   This is a routine wellness examination for Jeremiyah.  Hearing/Vision screen Hearing Screening - Comments:: Denies hearing difficulties   Vision Screening - Comments:: Wears eyeglasses/[place]   Goals Addressed   None    Depression Screen NO CHANGES from 11/15/22-per pt    11/15/2022   11:47 AM 03/10/2022    8:42 AM 03/10/2022    8:41 AM 10/22/2021    1:11 PM 09/29/2021   10:49 AM 12/12/2020    9:27 AM 10/23/2020   11:48 AM  PHQ 2/9 Scores  PHQ - 2 Score 0 0 0 0 0 0 0  PHQ- 9 Score 0 2  2   1     Fall Risk     11/16/2023   11:36 AM 06/12/2023    1:22 PM 11/15/2022   11:40 AM 11/14/2022    9:18 PM 03/10/2022    8:42 AM  Fall Risk   Falls in the past year? 1 0 1 1 0  Number falls in past yr: 0 0 1 1 0  Injury with Fall? 1 0 0 0 0  Comment broken lt hip      Risk for fall due to :  No Fall Risks;Impaired mobility History of fall(s);Impaired balance/gait;Impaired mobility;Orthopedic patient  No Fall Risks  Follow up Falls evaluation completed;Falls prevention discussed Falls evaluation completed   Falls evaluation completed      Data saved with a previous flowsheet row definition    MEDICARE RISK AT HOME:  Medicare Risk at Home Any stairs in or around the home?: No If so, are there any without handrails?: No Home free of loose throw rugs in walkways, pet beds,  electrical cords, etc?: Yes Adequate lighting in your home to reduce risk of falls?: Yes Life alert?: No Use of a cane, walker or w/c?: Yes (electric w/c) Grab bars in the  bathroom?: Yes Shower chair or bench in shower?: Yes Elevated toilet seat or a handicapped toilet?: Yes  TIMED UP AND GO:  Was the test performed?  No  Cognitive Function: Unable: Due to language barrier, hearing or vision limitations or other     03/03/2017    1:52 PM  MMSE - Mini Mental State Exam  Orientation to time 5   Orientation to Place 5   Registration 3   Attention/ Calculation 5   Recall 3   Language- name 2 objects 2   Language- repeat 1  Language- follow 3 step command 3   Language- read & follow direction 1   Write a sentence 1   Copy design 1   Total score 30      Data saved with a previous flowsheet row definition        11/15/2022   12:00 PM 12/12/2020    9:31 AM  6CIT Screen  What Year? 0 points 0 points  What month? 0 points 0 points  What time? 0 points 0 points  Count back from 20 0 points 0 points  Months in reverse 0 points 0 points  Repeat phrase 0 points 0 points  Total Score 0 points 0 points    Immunizations Immunization History  Administered Date(s) Administered   Fluad Quad(high Dose 65+) 12/01/2018   Influenza, High Dose Seasonal PF 12/03/2017, 12/07/2020   Influenza-Unspecified 11/26/2016, 12/30/2017   Pneumococcal Conjugate-13 12/30/2016   Pneumococcal Polysaccharide-23 12/30/2017   Tdap 06/05/2012   Zoster Recombinant(Shingrix) 12/01/2018    Screening Tests Health Maintenance  Topic Date Due   COVID-19 Vaccine (1) Never done   FOOT EXAM  Never done   OPHTHALMOLOGY EXAM  Never done   Diabetic kidney evaluation - Urine ACR  Never done   Hepatitis C Screening  Never done   Zoster Vaccines- Shingrix (2 of 2) 01/26/2019   DTaP/Tdap/Td (2 - Td or Tdap) 06/06/2022   Medicare Annual Wellness (AWV)  11/15/2023   INFLUENZA VACCINE  10/27/2023   HEMOGLOBIN A1C  10/31/2023   Diabetic kidney evaluation - eGFR measurement  06/13/2024   Pneumococcal Vaccine: 50+ Years  Completed   HPV VACCINES  Aged Out   Meningococcal B Vaccine   Aged Out   Colonoscopy  Discontinued    Health Maintenance  Health Maintenance Due  Topic Date Due   COVID-19 Vaccine (1) Never done   FOOT EXAM  Never done   OPHTHALMOLOGY EXAM  Never done   Diabetic kidney evaluation - Urine ACR  Never done   Hepatitis C Screening  Never done   Zoster Vaccines- Shingrix (2 of 2) 01/26/2019   DTaP/Tdap/Td (2 - Td or Tdap) 06/06/2022   Medicare Annual Wellness (AWV)  11/15/2023   INFLUENZA VACCINE  10/27/2023   HEMOGLOBIN A1C  10/31/2023   Health Maintenance Items Addressed: Diabetic Foot Exam recommended, See Nurse Notes at the end of this note  Additional Screening:  Vision Screening: Recommended annual ophthalmology exams for early detection of glaucoma and other disorders of the eye. Would you like a referral to an eye doctor? No    Dental Screening: Recommended annual dental exams for proper oral hygiene  Community Resource Referral / Chronic Care Management: CRR required this visit?  No   CCM required this visit?  No  Plan:    I have personally reviewed and noted the following in the patient's chart:   Medical and social history Use of alcohol, tobacco or illicit drugs  Current medications and supplements including opioid prescriptions. Patient is not currently taking opioid prescriptions. Functional ability and status Nutritional status Physical activity Advanced directives List of other physicians Hospitalizations, surgeries, and ER visits in previous 12 months Vitals Screenings to include cognitive, depression, and falls Referrals and appointments  In addition, I have reviewed and discussed with patient certain preventive protocols, quality metrics, and best practice recommendations. A written personalized care plan for preventive services as well as general preventive health recommendations were provided to patient.   Ada Holness L Preslei Blakley, CMA   11/16/2023   After Visit Summary: (MyChart) Due to this being a telephonic  visit, the after visit summary with patients personalized plan was offered to patient via MyChart   Notes: Patient is due for a diabetic eye exam and a Tdap.  He is also due for a foot exam, an A1C check, UACR and a Hep C screening, which all can be done during his next office visit.  Patient is currently in [facility] rehab for inflammation around the tumor.

## 2023-11-16 NOTE — Patient Instructions (Signed)
 Hunter Chang , Thank you for taking time out of your busy schedule to complete your Annual Wellness Visit with me. I enjoyed our conversation and look forward to speaking with you again next year. I, as well as your care team,  appreciate your ongoing commitment to your health goals. Please review the following plan we discussed and let me know if I can assist you in the future. Your Game plan/ To Do List     Follow up Visits: We will see or speak with you next year for your Next Medicare AWV with our clinical staff Have you seen your provider in the last 6 months (3 months if uncontrolled diabetes)? Yes.  Last office visit on 06/12/2023.  Clinician Recommendations:  Aim for 30 minutes of exercise or brisk walking, 6-8 glasses of water, and 5 servings of fruits and vegetables each day. You are due for a diabetic eye exam and a tetanus vaccine.  You are also due for a foot exam, an A1C, a kidney evaluation and a Hep C screening, which all will be done during your next office visit.        This is a list of the screenings recommended for you:  Health Maintenance  Topic Date Due   COVID-19 Vaccine (1) Never done   Complete foot exam   Never done   Eye exam for diabetics  Never done   Yearly kidney health urinalysis for diabetes  Never done   Hepatitis C Screening  Never done   Zoster (Shingles) Vaccine (2 of 2) 01/26/2019   DTaP/Tdap/Td vaccine (2 - Td or Tdap) 06/06/2022   Medicare Annual Wellness Visit  11/15/2023   Flu Shot  10/27/2023   Hemoglobin A1C  10/31/2023   Yearly kidney function blood test for diabetes  06/13/2024   Pneumococcal Vaccine for age over 34  Completed   HPV Vaccine  Aged Out   Meningitis B Vaccine  Aged Out   Colon Cancer Screening  Discontinued    Advanced directives: (Copy Requested) Please bring a copy of your health care power of attorney and living will to the office to be added to your chart at your convenience. You can mail to [facility] [address] [place], [place] [address] or email to [email or web address] Advance Care Planning is important because it:  [x]  Makes sure you receive the medical care that is consistent with your values, goals, and preferences  [x]  It provides guidance to your family and loved ones and reduces their decisional burden about whether or not they are making the right decisions based on your wishes.  Follow the link provided in your after visit summary or read over the paperwork we have mailed to you to help you started getting your Advance Directives in place. If you need assistance in completing these, please reach out to us  so that we can help you!  See attachments for Preventive Care and Fall Prevention Tips.

## 2023-11-17 DIAGNOSIS — R5383 Other fatigue: Secondary | ICD-10-CM | POA: Diagnosis not present

## 2023-11-17 DIAGNOSIS — D649 Anemia, unspecified: Secondary | ICD-10-CM | POA: Diagnosis not present

## 2023-11-21 DIAGNOSIS — D496 Neoplasm of unspecified behavior of brain: Secondary | ICD-10-CM | POA: Diagnosis not present

## 2023-11-21 DIAGNOSIS — C4A9 Merkel cell carcinoma, unspecified: Secondary | ICD-10-CM | POA: Diagnosis not present

## 2023-11-22 DIAGNOSIS — S0100XD Unspecified open wound of scalp, subsequent encounter: Secondary | ICD-10-CM | POA: Diagnosis not present

## 2023-11-23 DIAGNOSIS — G060 Intracranial abscess and granuloma: Secondary | ICD-10-CM | POA: Diagnosis not present

## 2023-11-23 DIAGNOSIS — Z9181 History of falling: Secondary | ICD-10-CM | POA: Diagnosis not present

## 2023-11-23 DIAGNOSIS — S72002D Fracture of unspecified part of neck of left femur, subsequent encounter for closed fracture with routine healing: Secondary | ICD-10-CM | POA: Diagnosis not present

## 2023-11-23 DIAGNOSIS — M8668 Other chronic osteomyelitis, other site: Secondary | ICD-10-CM | POA: Diagnosis not present

## 2023-11-23 DIAGNOSIS — G8194 Hemiplegia, unspecified affecting left nondominant side: Secondary | ICD-10-CM | POA: Diagnosis not present

## 2023-11-23 DIAGNOSIS — Z7952 Long term (current) use of systemic steroids: Secondary | ICD-10-CM | POA: Diagnosis not present

## 2023-11-23 DIAGNOSIS — D496 Neoplasm of unspecified behavior of brain: Secondary | ICD-10-CM | POA: Diagnosis not present

## 2023-11-23 DIAGNOSIS — D4819 Other specified neoplasm of uncertain behavior of connective and other soft tissue: Secondary | ICD-10-CM | POA: Diagnosis not present

## 2023-11-23 DIAGNOSIS — Z792 Long term (current) use of antibiotics: Secondary | ICD-10-CM | POA: Diagnosis not present

## 2023-11-23 DIAGNOSIS — Z09 Encounter for follow-up examination after completed treatment for conditions other than malignant neoplasm: Secondary | ICD-10-CM | POA: Diagnosis not present

## 2023-12-11 DIAGNOSIS — R251 Tremor, unspecified: Secondary | ICD-10-CM | POA: Diagnosis not present

## 2023-12-11 DIAGNOSIS — Z8661 Personal history of infections of the central nervous system: Secondary | ICD-10-CM | POA: Diagnosis not present

## 2023-12-13 DIAGNOSIS — M8668 Other chronic osteomyelitis, other site: Secondary | ICD-10-CM | POA: Diagnosis not present

## 2023-12-13 DIAGNOSIS — Z792 Long term (current) use of antibiotics: Secondary | ICD-10-CM | POA: Diagnosis not present

## 2023-12-13 DIAGNOSIS — D4819 Other specified neoplasm of uncertain behavior of connective and other soft tissue: Secondary | ICD-10-CM | POA: Diagnosis not present

## 2023-12-13 DIAGNOSIS — G060 Intracranial abscess and granuloma: Secondary | ICD-10-CM | POA: Diagnosis not present

## 2023-12-13 DIAGNOSIS — Z7952 Long term (current) use of systemic steroids: Secondary | ICD-10-CM | POA: Diagnosis not present

## 2023-12-15 DIAGNOSIS — D496 Neoplasm of unspecified behavior of brain: Secondary | ICD-10-CM | POA: Diagnosis not present

## 2023-12-15 DIAGNOSIS — G40909 Epilepsy, unspecified, not intractable, without status epilepticus: Secondary | ICD-10-CM | POA: Diagnosis not present

## 2023-12-15 DIAGNOSIS — Z79899 Other long term (current) drug therapy: Secondary | ICD-10-CM | POA: Diagnosis not present

## 2023-12-24 DIAGNOSIS — S72002D Fracture of unspecified part of neck of left femur, subsequent encounter for closed fracture with routine healing: Secondary | ICD-10-CM | POA: Diagnosis not present

## 2023-12-24 DIAGNOSIS — D496 Neoplasm of unspecified behavior of brain: Secondary | ICD-10-CM | POA: Diagnosis not present

## 2023-12-24 DIAGNOSIS — Z9181 History of falling: Secondary | ICD-10-CM | POA: Diagnosis not present

## 2023-12-24 DIAGNOSIS — G8194 Hemiplegia, unspecified affecting left nondominant side: Secondary | ICD-10-CM | POA: Diagnosis not present

## 2023-12-25 DIAGNOSIS — M8668 Other chronic osteomyelitis, other site: Secondary | ICD-10-CM | POA: Diagnosis not present

## 2023-12-25 DIAGNOSIS — G40909 Epilepsy, unspecified, not intractable, without status epilepticus: Secondary | ICD-10-CM | POA: Diagnosis not present

## 2023-12-31 ENCOUNTER — Other Ambulatory Visit: Payer: Self-pay | Admitting: Internal Medicine

## 2024-01-30 DIAGNOSIS — M869 Osteomyelitis, unspecified: Secondary | ICD-10-CM | POA: Diagnosis not present

## 2024-01-30 DIAGNOSIS — I739 Peripheral vascular disease, unspecified: Secondary | ICD-10-CM | POA: Diagnosis not present

## 2024-01-30 DIAGNOSIS — L600 Ingrowing nail: Secondary | ICD-10-CM | POA: Diagnosis not present

## 2024-01-30 DIAGNOSIS — E1169 Type 2 diabetes mellitus with other specified complication: Secondary | ICD-10-CM | POA: Diagnosis not present

## 2024-01-30 DIAGNOSIS — L84 Corns and callosities: Secondary | ICD-10-CM | POA: Diagnosis not present

## 2024-01-30 DIAGNOSIS — L03811 Cellulitis of head [any part, except face]: Secondary | ICD-10-CM | POA: Diagnosis not present

## 2024-01-30 DIAGNOSIS — D509 Iron deficiency anemia, unspecified: Secondary | ICD-10-CM | POA: Diagnosis not present

## 2024-01-30 DIAGNOSIS — B351 Tinea unguium: Secondary | ICD-10-CM | POA: Diagnosis not present

## 2024-01-30 DIAGNOSIS — L603 Nail dystrophy: Secondary | ICD-10-CM | POA: Diagnosis not present

## 2024-02-01 DIAGNOSIS — H2513 Age-related nuclear cataract, bilateral: Secondary | ICD-10-CM | POA: Diagnosis not present

## 2024-02-05 DIAGNOSIS — R059 Cough, unspecified: Secondary | ICD-10-CM | POA: Diagnosis not present

## 2024-02-05 DIAGNOSIS — R635 Abnormal weight gain: Secondary | ICD-10-CM | POA: Diagnosis not present

## 2024-02-25 DIAGNOSIS — R8290 Unspecified abnormal findings in urine: Secondary | ICD-10-CM | POA: Diagnosis not present

## 2024-02-26 DIAGNOSIS — R410 Disorientation, unspecified: Secondary | ICD-10-CM | POA: Diagnosis not present

## 2024-02-26 DIAGNOSIS — R531 Weakness: Secondary | ICD-10-CM | POA: Diagnosis not present

## 2024-02-27 DIAGNOSIS — D496 Neoplasm of unspecified behavior of brain: Secondary | ICD-10-CM | POA: Diagnosis not present

## 2024-02-27 DIAGNOSIS — D509 Iron deficiency anemia, unspecified: Secondary | ICD-10-CM | POA: Diagnosis not present

## 2024-02-27 DIAGNOSIS — Z79899 Other long term (current) drug therapy: Secondary | ICD-10-CM | POA: Diagnosis not present

## 2024-02-27 DIAGNOSIS — C4A9 Merkel cell carcinoma, unspecified: Secondary | ICD-10-CM | POA: Diagnosis not present

## 2024-03-05 ENCOUNTER — Telehealth: Payer: Self-pay

## 2024-03-05 DIAGNOSIS — Z7189 Other specified counseling: Secondary | ICD-10-CM

## 2024-03-05 NOTE — Progress Notes (Signed)
 Complex Care Management Note  Care Guide Note 03/05/2024 Name: JACLYN CAREW MRN: [id number] DOB: 02-Apr-1945  Aleene MARLA Sheldon is a 78 y.o. year old male who sees Burns, Glade PARAS, MD for primary care. I reached out to Aleene MARLA Sheldon by phone today to offer complex care management services.  Mr. Sanna was given information about Complex Care Management services today including:   The Complex Care Management services include support from the care team which includes your Nurse Care Manager, Clinical Social Worker, or Pharmacist.  The Complex Care Management team is here to help remove barriers to the health concerns and goals most important to you. Complex Care Management services are voluntary, and the patient may decline or stop services at any time by request to their care team member.   Complex Care Management Consent Status: Patient did not agree to participate in complex care management services at this time.  Follow up plan:  Patient is currently in SNF   Encounter Outcome:  Patient Refused  Jeoffrey Buffalo , RMA     [facility]  [facility] Guide  Direct Dial: [phone]  Website: [email or web address]
